# Patient Record
Sex: Male | Born: 1955 | Race: White | Hispanic: No | Marital: Married | Smoking: Former smoker
Health system: Southern US, Community
[De-identification: ages and names within clinical notes are randomized; demographics above are authoritative.]

## PROBLEM LIST (undated history)

## (undated) DIAGNOSIS — I671 Cerebral aneurysm, nonruptured: Secondary | ICD-10-CM

## (undated) DIAGNOSIS — I10 Essential (primary) hypertension: Secondary | ICD-10-CM

## (undated) DIAGNOSIS — I770 Arteriovenous fistula, acquired: Secondary | ICD-10-CM

## (undated) DIAGNOSIS — M329 Systemic lupus erythematosus, unspecified: Secondary | ICD-10-CM

## (undated) DIAGNOSIS — Z79899 Other long term (current) drug therapy: Secondary | ICD-10-CM

## (undated) DIAGNOSIS — I447 Left bundle-branch block, unspecified: Secondary | ICD-10-CM

## (undated) DIAGNOSIS — U071 COVID-19: Secondary | ICD-10-CM

## (undated) DIAGNOSIS — J449 Chronic obstructive pulmonary disease, unspecified: Secondary | ICD-10-CM

## (undated) DIAGNOSIS — N529 Male erectile dysfunction, unspecified: Secondary | ICD-10-CM

## (undated) DIAGNOSIS — E785 Hyperlipidemia, unspecified: Secondary | ICD-10-CM

## (undated) DIAGNOSIS — M1990 Unspecified osteoarthritis, unspecified site: Secondary | ICD-10-CM

## (undated) DIAGNOSIS — I2510 Atherosclerotic heart disease of native coronary artery without angina pectoris: Secondary | ICD-10-CM

## (undated) DIAGNOSIS — F419 Anxiety disorder, unspecified: Secondary | ICD-10-CM

## (undated) DIAGNOSIS — Z7982 Long term (current) use of aspirin: Secondary | ICD-10-CM

## (undated) DIAGNOSIS — K219 Gastro-esophageal reflux disease without esophagitis: Secondary | ICD-10-CM

## (undated) DIAGNOSIS — R7303 Prediabetes: Secondary | ICD-10-CM

## (undated) DIAGNOSIS — Z7960 Long term (current) use of unspecified immunomodulators and immunosuppressants: Secondary | ICD-10-CM

## (undated) DIAGNOSIS — C801 Malignant (primary) neoplasm, unspecified: Secondary | ICD-10-CM

## (undated) DIAGNOSIS — I428 Other cardiomyopathies: Secondary | ICD-10-CM

## (undated) DIAGNOSIS — I779 Disorder of arteries and arterioles, unspecified: Secondary | ICD-10-CM

## (undated) DIAGNOSIS — D649 Anemia, unspecified: Secondary | ICD-10-CM

## (undated) DIAGNOSIS — L4050 Arthropathic psoriasis, unspecified: Secondary | ICD-10-CM

## (undated) DIAGNOSIS — L930 Discoid lupus erythematosus: Secondary | ICD-10-CM

## (undated) DIAGNOSIS — R519 Headache, unspecified: Secondary | ICD-10-CM

## (undated) DIAGNOSIS — J189 Pneumonia, unspecified organism: Secondary | ICD-10-CM

## (undated) DIAGNOSIS — L409 Psoriasis, unspecified: Secondary | ICD-10-CM

## (undated) HISTORY — DX: Malignant (primary) neoplasm, unspecified: C80.1

## (undated) HISTORY — DX: Cerebral aneurysm, nonruptured: I67.1

## (undated) HISTORY — PX: CARDIAC CATHETERIZATION: SHX172

---

## 2005-01-16 ENCOUNTER — Ambulatory Visit: Payer: Self-pay | Admitting: Internal Medicine

## 2005-01-18 ENCOUNTER — Other Ambulatory Visit: Payer: Self-pay

## 2005-01-18 ENCOUNTER — Emergency Department: Payer: Self-pay | Admitting: Emergency Medicine

## 2007-09-25 ENCOUNTER — Emergency Department: Payer: Self-pay | Admitting: Emergency Medicine

## 2008-02-03 ENCOUNTER — Ambulatory Visit: Payer: Self-pay | Admitting: Internal Medicine

## 2008-02-03 DIAGNOSIS — I2510 Atherosclerotic heart disease of native coronary artery without angina pectoris: Secondary | ICD-10-CM

## 2008-02-03 HISTORY — DX: Atherosclerotic heart disease of native coronary artery without angina pectoris: I25.10

## 2008-02-06 ENCOUNTER — Inpatient Hospital Stay: Payer: Self-pay | Admitting: Cardiology

## 2008-02-06 ENCOUNTER — Ambulatory Visit: Payer: Self-pay | Admitting: Cardiology

## 2008-07-23 ENCOUNTER — Ambulatory Visit: Payer: Self-pay | Admitting: Cardiology

## 2010-11-30 ENCOUNTER — Ambulatory Visit: Payer: Self-pay | Admitting: Cardiology

## 2011-06-18 ENCOUNTER — Ambulatory Visit: Payer: Self-pay | Admitting: Family Medicine

## 2011-09-24 ENCOUNTER — Ambulatory Visit: Payer: Self-pay | Admitting: Family Medicine

## 2013-03-30 DIAGNOSIS — L4050 Arthropathic psoriasis, unspecified: Secondary | ICD-10-CM | POA: Insufficient documentation

## 2013-08-28 ENCOUNTER — Other Ambulatory Visit: Payer: Self-pay | Admitting: Rheumatology

## 2013-08-28 LAB — SYNOVIAL CELL COUNT + DIFF, W/ CRYSTALS
BASOS ABS: 0 %
Crystals, Joint Fluid: NONE SEEN
Eosinophil: 0 %
Lymphocytes: 32 %
Neutrophils: 49 %
Nucleated Cell Count: 323 /mm3
Other Cells BF: 0 %
Other Mononuclear Cells: 19 %

## 2015-02-10 ENCOUNTER — Other Ambulatory Visit: Payer: Self-pay | Admitting: Family Medicine

## 2015-02-10 ENCOUNTER — Ambulatory Visit
Admission: RE | Admit: 2015-02-10 | Discharge: 2015-02-10 | Disposition: A | Discharge status: Home / Self Care | Payer: BLUE CROSS/BLUE SHIELD | Source: Ambulatory Visit | Attending: Family Medicine | Admitting: Family Medicine

## 2015-02-10 DIAGNOSIS — M7989 Other specified soft tissue disorders: Secondary | ICD-10-CM | POA: Insufficient documentation

## 2015-02-10 DIAGNOSIS — M79604 Pain in right leg: Secondary | ICD-10-CM

## 2015-03-02 ENCOUNTER — Other Ambulatory Visit
Admission: RE | Admit: 2015-03-02 | Discharge: 2015-03-02 | Disposition: A | Discharge status: Home / Self Care | Payer: BLUE CROSS/BLUE SHIELD | Source: Ambulatory Visit | Attending: Rheumatology | Admitting: Rheumatology

## 2015-03-02 DIAGNOSIS — M25561 Pain in right knee: Secondary | ICD-10-CM | POA: Insufficient documentation

## 2015-03-02 LAB — SYNOVIAL CELL COUNT + DIFF, W/ CRYSTALS
Crystals, Fluid: NONE SEEN
Eosinophils-Synovial: 0 %
Lymphocytes-Synovial Fld: 16 %
Monocyte-Macrophage-Synovial Fluid: 2 %
NEUTROPHIL, SYNOVIAL: 82 %
Other Cells-SYN: 0
WBC, SYNOVIAL: 2873 /mm3 — AB (ref 0–200)

## 2015-10-05 ENCOUNTER — Encounter: Payer: Self-pay | Admitting: Dietician

## 2015-10-05 ENCOUNTER — Encounter: Payer: BLUE CROSS/BLUE SHIELD | Attending: Family Medicine | Admitting: Dietician

## 2015-10-05 VITALS — Ht 72.0 in | Wt 192.2 lb

## 2015-10-05 DIAGNOSIS — E785 Hyperlipidemia, unspecified: Secondary | ICD-10-CM

## 2015-10-05 DIAGNOSIS — I10 Essential (primary) hypertension: Secondary | ICD-10-CM

## 2015-10-05 DIAGNOSIS — L4050 Arthropathic psoriasis, unspecified: Secondary | ICD-10-CM

## 2015-10-05 DIAGNOSIS — R739 Hyperglycemia, unspecified: Secondary | ICD-10-CM

## 2015-10-05 NOTE — Patient Instructions (Signed)
   Plan for each meal to include a vegetable or fruit, or both. You can also add a vegetable or fruit for a snack during the day.   Good job including whole grain foods and sources of fiber!  Control portions of meats (palm-size), starches like rice, pasta, potatoes (1 cup, fist-size or less).  Try new meal ideas using recipes provided.  Add some light exercise, starting with 5-10 minutes, and increasing time gradually if you are able to do so without significant pain.

## 2015-10-05 NOTE — Progress Notes (Signed)
Medical Nutrition Therapy: Visit start time: 1030  end time: 1130  Assessment:  Diagnosis: hypertension, HLD, pre-diabetes Past medical history: GERD, rheumatoid arthritis Psychosocial issues/ stress concerns: history of anxiety Preferred learning method:  . Visual  Current weight: 192.2lbs with shoes  Height: 6'0" Medications, supplements: reviewed list in chart with patient  Progress and evaluation: Patient reports some history of elevated blood pressure, which he feels is now under control. He has started taking cholesterol-lowering medication. He voices concern over maintaining his overall health and reducing arthritis pain; he would like to manage with lifestyle as much as possible and minimize need for pain meds. He has worked to decrease sugar intake to prevent diabetes.    Physical activity: no regular exercise due to experiencing pain when exercising for more than 15 minutes. He does like to be active.   Dietary Intake:  Usual eating pattern includes 3 meals and 2 snacks per day. Dining out frequency: 2-3 meals per week.  Breakfast: 7am (not inst) oatmeal with psyllium husk, butter, honey Snack: none Lunch: 10/3 frozen pizza; 10/2 pb honey sandwiches (2) some with chips Snack: peanuts or mixed nuts, occ chips  Supper: taco salad with cheese past 2 days Snack: had been eating ice cream bars, but decreasing frequency. Occasionally craves sugar Beverages: water, tries to avoid sodas; 2-3 cups coffee in am, black  Nutrition Care Education: Topics covered: HTN, HLD, anti-inflammatory nutrition Basic nutrition: basic food groups, appropriate nutrient balance Diabetes prevention: importance of controlling sugar and carbohydrate intake, increasing fiber and vegetables and whole grains, role of protein.  Hypertension: identifying high sodium foods, identifying food sources of Calcium, potassium, magnesium Hyperlipidemia: healthy and unhealthy fats, role of fiber, food sources of  phytochemicals. Instructed on guidelines for DASH diet  Anti-inflammatory:  Importance of vegetables and fruits, whole grains, fish, limiting red and processed meats and added sugars. Discussed some potential for specific foods to have benefit such as curcumin, ginger, herbs, benefit of regular exercise.  Nutritional Diagnosis:  [place]-2.2 Altered nutrition-related laboratory As related to elevated cholesterol and blood sugar.  As evidenced by recent lab results, MD report.  Intervention: Instruction as noted above.   Encouraged DASH diet to improve HTN, HLD, as well as prevent DM and control arthritis pain.   Set goals with patient input.   Education Materials given:  Marland Kitchen. DASH diet . Fighting Inflammation . Anti-inflammatory recipes [facility] [facility] . Goals/ instructions  Learner/ who was taught:  . Patient   Level of understanding: Marland Kitchen. Verbalizes/ demonstrates competency  Demonstrated degree of understanding via:   Teach back Learning barriers: . None  Willingness to learn/ readiness for change: . Eager, change in progress  Monitoring and Evaluation:  Dietary intake, exercise, pain level and cholesterol, BP, blood sugar control      follow up: 11/16/15

## 2015-11-16 ENCOUNTER — Ambulatory Visit: Payer: BLUE CROSS/BLUE SHIELD | Admitting: Dietician

## 2015-12-05 ENCOUNTER — Encounter: Payer: Self-pay | Admitting: Dietician

## 2015-12-05 NOTE — Progress Notes (Signed)
Have not heard from patient to reschedule appointment which he cancelled for 11/15/16. Sent discharge letter to MD.

## 2016-03-28 DIAGNOSIS — F411 Generalized anxiety disorder: Secondary | ICD-10-CM | POA: Insufficient documentation

## 2016-11-14 DIAGNOSIS — Z87898 Personal history of other specified conditions: Secondary | ICD-10-CM | POA: Insufficient documentation

## 2016-12-19 ENCOUNTER — Other Ambulatory Visit: Payer: Self-pay | Admitting: Rheumatology

## 2016-12-19 DIAGNOSIS — M2392 Unspecified internal derangement of left knee: Secondary | ICD-10-CM

## 2016-12-19 DIAGNOSIS — M25562 Pain in left knee: Secondary | ICD-10-CM

## 2016-12-24 ENCOUNTER — Ambulatory Visit
Admission: RE | Admit: 2016-12-24 | Discharge: 2016-12-24 | Disposition: A | Discharge status: Home / Self Care | Payer: BC Managed Care – PPO | Source: Ambulatory Visit | Attending: Rheumatology | Admitting: Rheumatology

## 2016-12-24 DIAGNOSIS — M2392 Unspecified internal derangement of left knee: Secondary | ICD-10-CM | POA: Insufficient documentation

## 2016-12-24 DIAGNOSIS — M25562 Pain in left knee: Secondary | ICD-10-CM | POA: Diagnosis present

## 2016-12-24 LAB — POCT I-STAT CREATININE: Creatinine, Ser: 0.9 mg/dL (ref 0.61–1.24)

## 2016-12-24 MED ORDER — GADOBENATE DIMEGLUMINE 529 MG/ML IV SOLN
20.0000 mL | Freq: Once | INTRAVENOUS | Status: AC | PRN
  Administered 2016-12-24: 17 mL via INTRAVENOUS

## 2017-04-04 DIAGNOSIS — N529 Male erectile dysfunction, unspecified: Secondary | ICD-10-CM | POA: Insufficient documentation

## 2017-10-09 DIAGNOSIS — E559 Vitamin D deficiency, unspecified: Secondary | ICD-10-CM | POA: Insufficient documentation

## 2018-08-13 DIAGNOSIS — I10 Essential (primary) hypertension: Secondary | ICD-10-CM | POA: Insufficient documentation

## 2019-02-17 DIAGNOSIS — R7303 Prediabetes: Secondary | ICD-10-CM | POA: Insufficient documentation

## 2019-03-25 DIAGNOSIS — M85852 Other specified disorders of bone density and structure, left thigh: Secondary | ICD-10-CM | POA: Insufficient documentation

## 2019-06-10 ENCOUNTER — Ambulatory Visit (INDEPENDENT_AMBULATORY_CARE_PROVIDER_SITE_OTHER): Payer: Medicare PPO | Admitting: Dermatology

## 2019-06-10 ENCOUNTER — Other Ambulatory Visit: Payer: Self-pay

## 2019-06-10 DIAGNOSIS — Z1283 Encounter for screening for malignant neoplasm of skin: Secondary | ICD-10-CM | POA: Diagnosis not present

## 2019-06-10 DIAGNOSIS — L930 Discoid lupus erythematosus: Secondary | ICD-10-CM

## 2019-06-10 DIAGNOSIS — L409 Psoriasis, unspecified: Secondary | ICD-10-CM

## 2019-06-10 DIAGNOSIS — L4050 Arthropathic psoriasis, unspecified: Secondary | ICD-10-CM

## 2019-06-10 DIAGNOSIS — Q809 Congenital ichthyosis, unspecified: Secondary | ICD-10-CM

## 2019-06-10 MED ORDER — TRIAMCINOLONE ACETONIDE 0.1 % EX CREA: 1.0000 "application " | TOPICAL_CREAM | Freq: Every day | CUTANEOUS | 1 refills | Status: DC | PRN

## 2019-06-10 MED ORDER — ENSTILAR 0.005-0.064 % EX FOAM: CUTANEOUS | 4 refills | Status: DC

## 2019-06-10 NOTE — Progress Notes (Signed)
   New Patient Visit  Subjective  Brandin Stetzer Colman Birdwell. is a 64 y.o. male who presents for the following: Psoriasis (patient also has psoriatic arthritis and is using Enbrel. He was off of Enbrel for a few months due to insurance issues but restarted it about a week ago. Patient c/o itching and is concerned that some psoriasis areas may be infected. He is currently using Dial soap, H2O2, and Neosporin to aa's). The patient presents for Total-Body Skin Exam (TBSE) for skin cancer screening and mole check.  The following portions of the chart were reviewed this encounter and updated as appropriate:  Allergies  Meds  Problems  Med Hx  Surg Hx  Fam Hx     Review of Systems:  No other skin or systemic complaints except as noted in HPI or Assessment and Plan.  Objective  Well appearing patient in no apparent distress; mood and affect are within normal limits.  A total body skin examination was performed including trunk and extremities. Relevant physical exam findings are noted in the Assessment and Plan.  Objective  trunk and extremities: Well-demarcated erythematous papules/plaques with silvery scale.   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 Advised patient that Enbrel doesn't "kill" the immune system, it suppresses it, but that doesn't mean he's at an increased risk of contracting COVID.   Start Enstilar foam BID x 2 weeks then decrease to 5d/wk PRN flares. If not covered by insurance will start TMC 0.1% cream BID. Avoid f/g/a. Will send in both medications and patient to decide which one is more affordable for  him.  Continue Enbrel as prescribed by Dr. Gavin Potters.  Calcipotriene-Betameth Diprop (ENSTILAR) 0.005-0.064 % FOAM - trunk and extremities  triamcinolone cream (KENALOG) 0.1 % - trunk and extremities  Ichthyosis trunk and extremities  Recommend Amlactin Rapid Relief cream daily. Avoid taking hot showers, instead use cool to warm water.  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  Return in about 4 months (around 10/10/2019) for psoriasis recheck.  Maylene Roes, CMA, am acting as scribe for Armida Sans, MD .  Documentation: I have reviewed the above documentation for accuracy and completeness, and I agree with the above.  Armida Sans, MD

## 2019-06-11 ENCOUNTER — Encounter: Payer: Self-pay | Admitting: Dermatology

## 2019-06-19 LAB — COLOGUARD: COLOGUARD: NEGATIVE

## 2019-06-19 LAB — EXTERNAL GENERIC LAB PROCEDURE: COLOGUARD: NEGATIVE

## 2019-07-20 ENCOUNTER — Other Ambulatory Visit: Payer: Self-pay | Admitting: Dermatology

## 2019-07-20 DIAGNOSIS — L409 Psoriasis, unspecified: Secondary | ICD-10-CM

## 2019-08-21 DIAGNOSIS — Z87891 Personal history of nicotine dependence: Secondary | ICD-10-CM | POA: Insufficient documentation

## 2019-08-26 ENCOUNTER — Telehealth: Payer: Self-pay

## 2019-08-26 NOTE — Telephone Encounter (Signed)
Contacted patient for lung CT screening program after receiving referral from Dr. Burnadette Pop.  I explained program and patient is agreeable.  His smoking status is former smoker.  He smoked from age 64 to year 2010 and smoke 1 pack a day (he says sometimes he would smoke a few from a 2nd pack but not often).  Patient confirmed his insurance is [employer].  Patient shared he had a few CT scans in 2010 for a left lung nodule and he says no cancer was found at that time.  He has no current symptoms of cancer. He is hard of hearing and uses a hearing aid.  CT scan scheduled for Tuesday, Sept 21 at 2:00 pm.  Patient knows to arrive a little early to have shared decision making appointment with Nurse Practitioner.

## 2019-08-27 ENCOUNTER — Ambulatory Visit: Payer: BC Managed Care – PPO | Admitting: Dermatology

## 2019-08-28 ENCOUNTER — Other Ambulatory Visit: Payer: Self-pay | Admitting: *Deleted

## 2019-08-28 DIAGNOSIS — Z122 Encounter for screening for malignant neoplasm of respiratory organs: Secondary | ICD-10-CM

## 2019-08-28 DIAGNOSIS — Z87891 Personal history of nicotine dependence: Secondary | ICD-10-CM

## 2019-08-28 NOTE — Progress Notes (Signed)
Former, smoker, quit 2010, 37 pack year

## 2019-09-22 ENCOUNTER — Inpatient Hospital Stay: Payer: Medicare PPO | Attending: Nurse Practitioner | Admitting: Oncology

## 2019-09-22 ENCOUNTER — Ambulatory Visit
Admission: RE | Admit: 2019-09-22 | Discharge: 2019-09-22 | Disposition: A | Discharge status: Home / Self Care | Payer: Medicare PPO | Source: Ambulatory Visit | Attending: Nurse Practitioner | Admitting: Nurse Practitioner

## 2019-09-22 ENCOUNTER — Other Ambulatory Visit: Payer: Self-pay

## 2019-09-22 ENCOUNTER — Encounter: Payer: Self-pay | Admitting: Oncology

## 2019-09-22 DIAGNOSIS — Z87891 Personal history of nicotine dependence: Secondary | ICD-10-CM

## 2019-09-22 DIAGNOSIS — Z122 Encounter for screening for malignant neoplasm of respiratory organs: Secondary | ICD-10-CM | POA: Diagnosis present

## 2019-09-22 NOTE — Progress Notes (Signed)
Virtual Visit via Video Note  I connected with Timothy Reeves on 09/22/19 at  2:00 PM EDT by a video enabled telemedicine application and verified that I am speaking with the correct person using two identifiers.  Location: Patient: [name] Provider:Clinic    I discussed the limitations of evaluation and management by telemedicine and the availability of in person appointments. The patient expressed understanding and agreed to proceed.  I discussed the assessment and treatment plan with the patient. The patient was provided an opportunity to ask questions and all were answered. The patient agreed with the plan and demonstrated an understanding of the instructions.   The patient was advised to call back or seek an in-person evaluation if the symptoms worsen or if the condition fails to improve as anticipated.   In accordance with CMS guidelines, patient has met eligibility criteria including age, absence of signs or symptoms of lung cancer.  Social History   Tobacco Use  . Smoking status: Former Smoker    Packs/day: 1.00    Years: 37.00    Pack years: 37.00    Types: Cigarettes    Quit date: 10/04/2008    Years since quitting: 10.9  Substance Use Topics  . Alcohol use: Yes    Alcohol/week: 2.0 standard drinks    Types: 2 Cans of beer per week  . Drug use: Not on file      A shared decision-making session was conducted prior to the performance of CT scan. This includes one or more decision aids, includes benefits and harms of screening, follow-up diagnostic testing, over-diagnosis, false positive rate, and total radiation exposure.   Counseling on the importance of adherence to annual lung cancer LDCT screening, impact of co-morbidities, and ability or willingness to undergo diagnosis and treatment is imperative for compliance of the program.   Counseling on the importance of continued smoking cessation for former smokers; the importance of smoking cessation for current smokers, and  information about tobacco cessation interventions have been given to patient including [place] and 1800 quit [place] programs.   Written order for lung cancer screening with LDCT has been given to the patient and any and all questions have been answered to the best of my abilities.    Yearly follow up will be coordinated by Burgess Estelle, Thoracic Navigator.  I provided 15 minutes of face-to-face video visit time during this encounter, and > 50% was spent counseling as documented under my assessment & plan.   Jacquelin Hawking, NP

## 2019-09-24 ENCOUNTER — Telehealth: Payer: Self-pay | Admitting: *Deleted

## 2019-09-24 NOTE — Telephone Encounter (Signed)
Notified patient of LDCT lung cancer screening program results with recommendation for 6 month follow up imaging. Also notified of incidental findings noted below and is encouraged to discuss further with PCP who will receive a copy of this note and/or the CT report. Patient verbalizes understanding.   IMPRESSION: 1. Lung-RADS 3S, probably benign findings. Short-term follow-up in 6 months is recommended with repeat low-dose chest CT without contrast (please use the following order, "CT CHEST LCS NODULE FOLLOW-UP W/O CM"). 2. The "S" modifier above refers to potentially clinically significant non lung cancer related findings. Specifically, there is aortic atherosclerosis, in addition to left main and 2 vessel coronary artery disease. Please note that although the presence of coronary artery calcium documents the presence of coronary artery disease, the severity of this disease and any potential stenosis cannot be assessed on this non-gated CT examination. Assessment for potential risk factor modification, dietary therapy or pharmacologic therapy may be warranted, if clinically indicated. 3. Mild diffuse bronchial wall thickening with moderate centrilobular and paraseptal emphysema; imaging findings suggestive of underlying COPD.  Aortic Atherosclerosis (ICD10-I70.0) and Emphysema (ICD10-J43.9).

## 2019-10-12 ENCOUNTER — Ambulatory Visit: Payer: Medicare PPO | Admitting: Dermatology

## 2019-10-12 ENCOUNTER — Other Ambulatory Visit: Payer: Self-pay

## 2019-10-12 ENCOUNTER — Encounter: Payer: Self-pay | Admitting: Dermatology

## 2019-10-12 DIAGNOSIS — L4050 Arthropathic psoriasis, unspecified: Secondary | ICD-10-CM | POA: Diagnosis not present

## 2019-10-12 DIAGNOSIS — L400 Psoriasis vulgaris: Secondary | ICD-10-CM | POA: Diagnosis not present

## 2019-10-12 DIAGNOSIS — L930 Discoid lupus erythematosus: Secondary | ICD-10-CM | POA: Diagnosis not present

## 2019-10-12 MED ORDER — CALCIPOTRIENE 0.005 % EX CREA: TOPICAL_CREAM | CUTANEOUS | 3 refills | Status: DC

## 2019-10-12 NOTE — Progress Notes (Signed)
   Follow-Up Visit   Subjective  Timothy Reeves. is a 64 y.o. male who presents for the following: Psoriasis (with PsA - patient currently using TMC 0.1% cream PRN flares. He is hesitant to restart the Enbrel due to COVID and he doesn't want to decrease/suppress his immune system at this time). He does not want to get the COVID vaccine at this point in time.   The following portions of the chart were reviewed this encounter and updated as appropriate:  Allergies  Meds  Problems  Med Hx  Surg Hx  Fam Hx     Review of Systems:  No other skin or systemic complaints except as noted in HPI or Assessment and Plan.  Objective  Well appearing patient in no apparent distress; mood and affect are within normal limits.  A focused examination was performed including the extremities . Relevant physical exam findings are noted in the Assessment and Plan.  Objective  lower legs: Well-demarcated erythematous papules/plaques with silvery scale.   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  Currently off Enbrel.  If he does re-start a biologic, he may do better with joints and skin both with Cosentyx or Taltz.  The pt and Dr Lavenia Atlas will discuss since the arthritis is the primary issue.  Discussed with patient that not using Enbrel will not prevent him from getting COVID. Advised patient to discuss and follow up on Enbrel and COVID with Dr. Gavin Potters.   Continue TMC 0.1% cream QOD alternating with Calcipotriene PRN flares. Avoid f/g/a. Topical steroids (such as triamcinolone, fluocinolone, fluocinonide, mometasone, clobetasol, halobetasol, betamethasone, hydrocortisone) can cause thinning and lightening of the skin if they are used for too long in the same area. Your physician has selected the right strength medicine for your problem and area affected on  the body. Please use your medication only as directed by your physician to prevent side effects.    Start Calcipotriene cream QOD alternating with TMC.   Recommend wearing shorter socks that don't rub the lower legs as much.   Recommend multivitamin A, C, and E daily.   Recommend Taltz or Cosentyx instead of Enbrel when patient is ready to restart biologic treatment.   calcipotriene (DOVONOX) 0.005 % cream - lower legs  Discoid lupus erythematosus Face With systemic involvement per patient, currently in remission - Discussed laser treatment for scarring patient to follow up with Timothy Reeves, our laser technician at [facility] skin center.  Return in about 3 months (around 01/12/2020) for F/U appt. - psoriasis with PsA .  Timothy Reeves, CMA, am acting as scribe for Armida Sans, MD .  Documentation: I have reviewed the above documentation for accuracy and completeness, and I agree with the above.  Armida Sans, MD

## 2019-10-12 NOTE — Patient Instructions (Signed)
Topical steroids (such as triamcinolone, fluocinolone, fluocinonide, mometasone, clobetasol, halobetasol, betamethasone, hydrocortisone) can cause thinning and lightening of the skin if they are used for too long in the same area. Your physician has selected the right strength medicine for your problem and area affected on the body. Please use your medication only as directed by your physician to prevent side effects.   Recommend a multivitamin containing A, C, and E.

## 2019-10-21 ENCOUNTER — Telehealth: Payer: Self-pay

## 2019-10-21 MED ORDER — BETAMETHASONE DIPROPIONATE 0.05 % EX CREA
TOPICAL_CREAM | Freq: Two times a day (BID) | CUTANEOUS | 0 refills | Status: DC | PRN
Start: 2019-10-21 — End: 2021-05-03

## 2019-10-21 NOTE — Telephone Encounter (Signed)
Calcipotriene Cream has been denied by [employer]. They state patient must first try and fail Betamethasone Dipropionate. Even with GoodRX coupon the medication is still over $100. *Pt has medicare ins.

## 2019-10-21 NOTE — Telephone Encounter (Signed)
Send betamethasone diproprionate Keep fu appt

## 2019-10-21 NOTE — Telephone Encounter (Signed)
Prescription sent in and patient advised. 

## 2019-11-10 ENCOUNTER — Telehealth: Payer: Self-pay

## 2019-11-10 NOTE — Telephone Encounter (Signed)
Patient has been using Betamethasone with no improvement. Patient states he feels as if the topical has made his psoriasis worse.   Will file an appeal.

## 2019-11-12 ENCOUNTER — Other Ambulatory Visit: Payer: Self-pay

## 2019-11-12 DIAGNOSIS — L400 Psoriasis vulgaris: Secondary | ICD-10-CM

## 2019-11-12 MED ORDER — CALCIPOTRIENE 0.005 % EX CREA: TOPICAL_CREAM | CUTANEOUS | 3 refills | Status: DC

## 2019-11-12 NOTE — Progress Notes (Signed)
RX reordered due to [facility] approval

## 2020-01-28 ENCOUNTER — Ambulatory Visit: Payer: Medicare PPO | Admitting: Dermatology

## 2020-01-28 ENCOUNTER — Other Ambulatory Visit: Payer: Self-pay

## 2020-01-28 DIAGNOSIS — L409 Psoriasis, unspecified: Secondary | ICD-10-CM

## 2020-01-28 DIAGNOSIS — L905 Scar conditions and fibrosis of skin: Secondary | ICD-10-CM

## 2020-01-28 DIAGNOSIS — L4050 Arthropathic psoriasis, unspecified: Secondary | ICD-10-CM | POA: Diagnosis not present

## 2020-01-28 DIAGNOSIS — L400 Psoriasis vulgaris: Secondary | ICD-10-CM | POA: Diagnosis not present

## 2020-01-28 DIAGNOSIS — L930 Discoid lupus erythematosus: Secondary | ICD-10-CM | POA: Diagnosis not present

## 2020-01-28 NOTE — Patient Instructions (Addendum)
Start Otezla sample pack.  Take 10 mg once a day for 4 days. Then 20 mg once a day for 4 days. Then 30 mg once a day until your follow up.  Side effects of Otezla (apremilast) include diarrhea, nausea, headache, upper respiratory infection, depression, and weight decrease (5-10%). It should only be taken by pregnant women after a discussion regarding risks and benefits with their doctor. Goal is control of skin condition, not cure.  The use of [place] requires long term medication management, including periodic office visits.

## 2020-01-28 NOTE — Progress Notes (Signed)
   Follow-Up Visit   Subjective  Timothy Reeves. is a 65 y.o. male who presents for the following: Psoriasis (3 mo f/u. Pt has been alternating TMC 0.1% cream with Calcipotriene PRN flares. He states that the [facility] has really been helping. Pt has used Enbrel in the past but stopped it due to COVID. Pt does not want to suppress his immune system. ).  The following portions of the chart were reviewed this encounter and updated as appropriate:  Allergies  Meds  Problems  Med Hx  Surg Hx  Fam Hx     Review of Systems: No other skin or systemic complaints except as noted in HPI or Assessment and Plan.  Objective  Well appearing patient in no apparent distress; mood and affect are within normal limits.  A focused examination was performed including legs, abdomen, back, arms. Relevant physical exam findings are noted in the Assessment and Plan.  Objective  lower legs, abdomen, back,: Well-demarcated erythematous papules/plaques with silvery scale, guttate pink scaly papules.   BSA 25%  Images        Objective  face: Scarring from discoid lesions caused by lupus  Images        Assessment & Plan  Psoriasis of skin - severe and generalized - with Psoriatic Arthritis lower legs, abdomen, back, Plaque psoriasis with Psoriatic Arthritis Joints are not bothering him.  Pt has had good results with Enbrel in past but due to COVID, wants to stay off Enbrel due to immunosuppression. Discussed [place] option as is not a significant immunosuppressant.  Discussed starting Otezla.  Sample pack given today.   Titrate up to 30 mg po. Take 10 mg once a day for 4 days. Then 20 mg once a day for 4 days. Then 30 mg once a day until f/u.   Side effects of Otezla (apremilast) include diarrhea, nausea, headache, upper respiratory infection, depression, and weight decrease (5-10%). It should only be taken by pregnant women after a discussion regarding risks and benefits with their  doctor. Goal is control of skin condition, not cure.  The use of [place] requires long term medication management, including periodic office visits.  Psoriasis is a chronic non-curable, but treatable genetic/hereditary disease that may have other systemic features affecting other organ systems such as joints (Psoriatic Arthritis). It is associated with an increased risk of inflammatory bowel disease, heart disease, non-alcoholic fatty liver disease, and depression.    Other Related Medications Calcipotriene-Betameth Diprop (ENSTILAR) 0.005-0.064 % FOAM triamcinolone cream (KENALOG) 0.1 %  Scars of face from Discoid Lupus as component of Systemic Lupus in past - now in remission. Face See photos.  Pt had asked if any treatment options. Discussed that there is not a good treatment option for the scars.  Discussed laser option, but do not feel worthwhile.  Return in about 1 month (around 02/28/2020) for psoriasis.   IEpifania Gore, CMA, am acting as scribe for Armida Sans, MD.  Documentation: I have reviewed the above documentation for accuracy and completeness, and I agree with the above.  Armida Sans, MD

## 2020-02-09 ENCOUNTER — Encounter: Payer: Self-pay | Admitting: Dermatology

## 2020-02-29 DIAGNOSIS — Z862 Personal history of diseases of the blood and blood-forming organs and certain disorders involving the immune mechanism: Secondary | ICD-10-CM | POA: Insufficient documentation

## 2020-03-01 ENCOUNTER — Other Ambulatory Visit (HOSPITAL_COMMUNITY): Payer: Self-pay | Admitting: Family Medicine

## 2020-03-01 ENCOUNTER — Other Ambulatory Visit: Payer: Self-pay | Admitting: Family Medicine

## 2020-03-01 DIAGNOSIS — Z87891 Personal history of nicotine dependence: Secondary | ICD-10-CM

## 2020-03-01 DIAGNOSIS — Z136 Encounter for screening for cardiovascular disorders: Secondary | ICD-10-CM

## 2020-03-03 ENCOUNTER — Ambulatory Visit: Payer: Medicare PPO | Admitting: Dermatology

## 2020-03-07 ENCOUNTER — Ambulatory Visit
Admission: RE | Admit: 2020-03-07 | Discharge: 2020-03-07 | Disposition: A | Discharge status: Home / Self Care | Payer: Medicare PPO | Source: Ambulatory Visit | Attending: Family Medicine | Admitting: Family Medicine

## 2020-03-07 ENCOUNTER — Telehealth: Payer: Self-pay | Admitting: *Deleted

## 2020-03-07 ENCOUNTER — Other Ambulatory Visit: Payer: Self-pay

## 2020-03-07 DIAGNOSIS — Z87891 Personal history of nicotine dependence: Secondary | ICD-10-CM | POA: Diagnosis not present

## 2020-03-07 DIAGNOSIS — Z136 Encounter for screening for cardiovascular disorders: Secondary | ICD-10-CM | POA: Insufficient documentation

## 2020-03-07 NOTE — Telephone Encounter (Signed)
Called pt and wa able to get him on the phone and he is interested to get 6 month low dose lung screening. He stopped smoking 2010. Smoked 1PPD  since he was 26 until til he stopped in 2010. No cancer or working up for cancer, no copd and pt still has [employer]. Pt was set up for 3/31 at 8 am at outpatient [place] location and pt thought he remembered where it was but he took down address and telephone number just in case.

## 2020-03-10 ENCOUNTER — Other Ambulatory Visit: Payer: Self-pay | Admitting: *Deleted

## 2020-03-10 DIAGNOSIS — R918 Other nonspecific abnormal finding of lung field: Secondary | ICD-10-CM

## 2020-03-10 DIAGNOSIS — Z87891 Personal history of nicotine dependence: Secondary | ICD-10-CM

## 2020-03-10 NOTE — Progress Notes (Signed)
Contacted and scheduled for LCS nodule follow up scan. Patient is a former smoker, quit 2010, 37 pack year history.  

## 2020-03-29 ENCOUNTER — Emergency Department
Admission: EM | Admit: 2020-03-29 | Discharge: 2020-03-29 | Disposition: A | Discharge status: Home / Self Care | Payer: Medicare PPO | Attending: Emergency Medicine | Admitting: Emergency Medicine

## 2020-03-29 ENCOUNTER — Other Ambulatory Visit: Payer: Self-pay

## 2020-03-29 DIAGNOSIS — Z87891 Personal history of nicotine dependence: Secondary | ICD-10-CM | POA: Diagnosis not present

## 2020-03-29 DIAGNOSIS — I10 Essential (primary) hypertension: Secondary | ICD-10-CM

## 2020-03-29 DIAGNOSIS — R42 Dizziness and giddiness: Secondary | ICD-10-CM | POA: Insufficient documentation

## 2020-03-29 HISTORY — DX: Essential (primary) hypertension: I10

## 2020-03-29 LAB — COMPREHENSIVE METABOLIC PANEL
ALT: 19 U/L (ref 0–44)
AST: 23 U/L (ref 15–41)
Albumin: 4.1 g/dL (ref 3.5–5.0)
Alkaline Phosphatase: 77 U/L (ref 38–126)
Anion gap: 7 (ref 5–15)
BUN: 21 mg/dL (ref 8–23)
CO2: 24 mmol/L (ref 22–32)
Calcium: 9.3 mg/dL (ref 8.9–10.3)
Chloride: 107 mmol/L (ref 98–111)
Creatinine, Ser: 0.81 mg/dL (ref 0.61–1.24)
GFR, Estimated: >60 mL/min (ref >60)
Glucose, Bld: 105 mg/dL — ABNORMAL HIGH (ref 70–99)
Potassium: 4.2 mmol/L (ref 3.5–5.1)
Sodium: 138 mmol/L (ref 135–145)
Total Bilirubin: 1.2 mg/dL (ref 0.3–1.2)
Total Protein: 7.4 g/dL (ref 6.5–8.1)

## 2020-03-29 LAB — CBC
HCT: 39.9 % (ref 39.0–52.0)
Hemoglobin: 13.8 g/dL (ref 13.0–17.0)
MCH: 30.4 pg (ref 26.0–34.0)
MCHC: 34.6 g/dL (ref 30.0–36.0)
MCV: 87.9 fL (ref 80.0–100.0)
Platelets: 205 10*3/uL (ref 150–400)
RBC: 4.54 MIL/uL (ref 4.22–5.81)
RDW: 12.7 % (ref 11.5–15.5)
WBC: 5.1 10*3/uL (ref 4.0–10.5)
nRBC: 0 % (ref 0.0–0.2)

## 2020-03-29 LAB — TROPONIN I (HIGH SENSITIVITY): Troponin I (High Sensitivity): 16 ng/L (ref <18)

## 2020-03-29 MED ORDER — LORAZEPAM 1 MG PO TABS: 1.0000 mg | ORAL_TABLET | Freq: Once | ORAL | Status: DC

## 2020-03-29 MED ORDER — SODIUM CHLORIDE 0.9 % IV BOLUS
1000.0000 mL | Freq: Once | INTRAVENOUS | Status: AC
  Administered 2020-03-29: 1000 mL via INTRAVENOUS

## 2020-03-29 NOTE — ED Provider Notes (Signed)
[facility] [place] Emergency Department Provider Note  Time seen: 10:19 AM  I have reviewed the triage vital signs and the nursing notes.   HISTORY  Chief Complaint Dizziness   HPI Timothy Caso. is a 65 y.o. male with a past medical history of hypertension who presents to the emergency department for dizziness.  According to the patient for the past 3 days now he has been feeling dizzy which he describes as more of a lightheaded/spinning sensation.  Patient went to [facility] clinic and was sent to the emergency department for high blood pressure per patient greater than 200.  Patient states over the past 3 days he has been feeling somewhat off balance with a spinning type sensation.  Patient denies any weakness or numbness of any arm or leg confusion or slurred speech.  Denies any fever but does state 2 days ago was having cough and congestion.  No vomiting or diarrhea.  No chest pain or abdominal pain.  Largely negative review of systems otherwise.   Past Medical History:  Diagnosis Date  . Hypertension     There are no problems to display for this patient.   Past Surgical History:  Procedure Laterality Date  . CARDIAC CATHETERIZATION      Prior to Admission medications   Medication Sig Start Date End Date Taking? Authorizing Provider  atorvastatin (LIPITOR) 20 MG tablet TAKE 1/2 TABLET EVERY NIGHT 05/18/15   [provider]  betamethasone dipropionate 0.05 % cream Apply topically 2 (two) times daily as needed (Rash). 10/21/19   Deirdre Evener, MD  calcipotriene (DOVONOX) 0.005 % cream Apply to aa's psoriasis QOD alternating with Triamcinolone. 11/12/19   Deirdre Evener, MD  Calcipotriene-Betameth Diprop (ENSTILAR) 0.005-0.064 % FOAM Apply to aa's psoriasis BID x 2 weeks. Then decrease to BID 5d/wk. Avoid f/g/a. 06/10/19   Deirdre Evener, MD  etanercept (ENBREL SURECLICK) 50 MG/ML injection Inject into the skin. 02/09/19   [provider]   naproxen (NAPROSYN) 500 MG tablet Take by mouth. 09/26/15   [provider]  omeprazole (PRILOSEC) 20 MG capsule TAKE 1 CAPSULE BY MOUTH EVERY OTHER DAY 05/06/15   [provider]  PARoxetine (PAXIL) 20 MG tablet TAKE 1/2 TABLET AT BEDTIME 07/28/15   [provider]  sildenafil (VIAGRA) 100 MG tablet Take by mouth.    [provider]  triamcinolone cream (KENALOG) 0.1 % APPLY TO AFFECTED AREAS (PSORIASIS) TWICE DAILY AS NEEDED FOR FLARES. AVOID FACE, GROIN, AXILLA. 07/20/19   Deirdre Evener, MD    No Known Allergies  No family history on file.  Social History Social History   Tobacco Use  . Smoking status: Former Smoker    Packs/day: 1.00    Years: 37.00    Pack years: 37.00    Types: Cigarettes    Quit date: 10/04/2008    Years since quitting: 11.4  . Smokeless tobacco: Never Used  Substance Use Topics  . Alcohol use: Yes    Alcohol/week: 2.0 standard drinks    Types: 2 Cans of beer per week  . Drug use: Not Currently    Review of Systems Constitutional: Negative for fever. ENT: Congestion 2 days ago now largely resolved Cardiovascular: Negative for chest pain. Respiratory: Negative for shortness of breath.  Mild cough 2 days ago, largely resolved. Gastrointestinal: Negative for abdominal pain, vomiting and diarrhea. Musculoskeletal: Negative for musculoskeletal complaints Neurological: Negative for headache All other ROS negative  ____________________________________________   PHYSICAL EXAM:  VITAL  SIGNS: ED Triage Vitals  Enc Vitals Group     BP 03/29/20 1000 (!) 210/123     Pulse Rate 03/29/20 1000 86     Resp 03/29/20 1000 17     Temp 03/29/20 1000 97.8 F (36.6 C)     Temp Source 03/29/20 1000 Oral     SpO2 03/29/20 1000 97 %     Weight 03/29/20 0954 180 lb (81.6 kg)     Height 03/29/20 0954 6\' 1"  (1.854 m)     Head Circumference --      Peak Flow --      Pain Score 03/29/20 0953 0     Pain Loc --      Pain Edu? --       Excl. in GC? --    Constitutional: Alert and oriented. Well appearing and in no distress. Eyes: Normal exam ENT      Head: Normocephalic and atraumatic.      Mouth/Throat: Mucous membranes are moist. Cardiovascular: Normal rate, regular rhythm. Respiratory: Normal respiratory effort without tachypnea nor retractions. Breath sounds are clear Gastrointestinal: Soft and nontender. No distention. Musculoskeletal: Nontender with normal range of motion in all extremities Neurologic:  Normal speech and language. No gross focal neurologic deficits.  Equal grip strength bilaterally.  No cranial nerve deficits Skin:  Skin is warm, dry and intact.  Psychiatric: Somewhat anxious appearing.  ____________________________________________    EKG  EKG viewed and interpreted by myself shows a normal sinus rhythm at 63 bpm with a widened QRS, right axis deviation, largely normal intervals with nonspecific ST changes.  Morphology most consistent with left bundle branch block.  Last EKG for comparison is 2007. Interviewing the patient's care everywhere on 05/21/2019 he had an EKG performed that is also read as a left bundle branch block with nonspecific T wave abnormalities in the inferior leads.  Which could be consistent with today's EKG. ____________________________________________    INITIAL IMPRESSION / ASSESSMENT AND PLAN / ED COURSE  Pertinent labs & imaging results that were available during my care of the patient were reviewed by me and considered in my medical decision making (see chart for details).   Patient presents to the emergency department for dizziness ongoing over the past 3 days.  Patient sent for significant hypertension currently 210/123.  Patient is anxious in appearance.  We will check labs and continue to closely monitor.  Patient has a somewhat abnormal EKG however in reviewing the patient's care everywhere has a similar read on his EKG from 05/21/2019.  We will check cardiac  enzymes as a precaution.  We will continue to closely monitor while awaiting lab results in the emergency department.  We will treat anxiety while awaiting results.  Patient had a fairly normal blood pressure during his last cardiology visit per care everywhere.  Given the patient's cough and congestion 2 days ago ordered a Covid test however patient is refusing at this time.  Patient continues to appear well.  Lab work largely nonrevealing.  Troponin is negative.  EKG compared to his old EKG in care everywhere appears to be largely unchanged left bundle branch block.  Patient refused the Ativan as well.  Does appear somewhat anxious, blood pressure is down to 179/95.  Patient is on blood pressure medication but only takes it at night.  I discussed with the patient taking his blood pressure medication this evening and rechecking his blood pressure tomorrow morning.  Patient will follow up with his PCP.   05/23/2019  Timothy Reeves. was evaluated in Emergency Department on 03/29/2020 for the symptoms described in the history of present illness. He was evaluated in the context of the global COVID-19 pandemic, which necessitated consideration that the patient might be at risk for infection with the SARS-CoV-2 virus that causes COVID-19. Institutional protocols and algorithms that pertain to the evaluation of patients at risk for COVID-19 are in a state of rapid change based on information released by regulatory bodies including the CDC and federal and state organizations. These policies and algorithms were followed during the patient's care in the ED.  ____________________________________________   FINAL CLINICAL IMPRESSION(S) / ED DIAGNOSES  Dizziness Hypertension   Minna Antis, MD 03/29/20 1258

## 2020-03-29 NOTE — ED Notes (Signed)
Pt refusing his covid test for now , if admitted pt will take the test

## 2020-03-29 NOTE — ED Notes (Signed)
Pt refused nasal swab

## 2020-03-29 NOTE — ED Triage Notes (Signed)
Pt sent from [facility] with c/o HA dizziness since Sunday, states Saturday he had some congestion/cough but none of those sx since. Pt is a/ox4 on arrival,

## 2020-03-31 ENCOUNTER — Other Ambulatory Visit: Payer: Self-pay

## 2020-03-31 ENCOUNTER — Ambulatory Visit
Admission: RE | Admit: 2020-03-31 | Discharge: 2020-03-31 | Disposition: A | Discharge status: Home / Self Care | Payer: Medicare PPO | Source: Ambulatory Visit | Attending: Nurse Practitioner | Admitting: Nurse Practitioner

## 2020-03-31 DIAGNOSIS — Z87891 Personal history of nicotine dependence: Secondary | ICD-10-CM

## 2020-03-31 DIAGNOSIS — R918 Other nonspecific abnormal finding of lung field: Secondary | ICD-10-CM | POA: Insufficient documentation

## 2020-04-08 DIAGNOSIS — R911 Solitary pulmonary nodule: Secondary | ICD-10-CM | POA: Insufficient documentation

## 2020-04-11 ENCOUNTER — Telehealth: Payer: Self-pay | Admitting: *Deleted

## 2020-04-11 NOTE — Telephone Encounter (Signed)
Notified patient of LDCT lung cancer screening program results with recommendation for 3 month follow up imaging. Also notified of incidental findings noted below and is encouraged to discuss further with PCP who will receive a copy of this note and/or the CT report. Patient verbalizes understanding.   IMPRESSION: 1. Lung-RADS 4A, suspicious. Follow up low-dose chest CT without contrast in 3 months (please use the following order, "CT CHEST LCS NODULE FOLLOW-UP W/O CM") is recommended. Alternatively, PET may be considered when there is a solid component 61mm or larger. 2. Aortic Atherosclerosis (ICD10-I70.0) and Emphysema (ICD10-J43.9). 3. Coronary artery atherosclerotic calcifications.

## 2020-04-13 ENCOUNTER — Other Ambulatory Visit: Payer: Self-pay | Admitting: *Deleted

## 2020-04-13 DIAGNOSIS — Z87891 Personal history of nicotine dependence: Secondary | ICD-10-CM

## 2020-04-13 DIAGNOSIS — R918 Other nonspecific abnormal finding of lung field: Secondary | ICD-10-CM

## 2020-04-13 NOTE — Progress Notes (Signed)
Contacted and scheduled for LCS nodule follow up scan. Patient is a former smoker, quit 2010, 37 pack year history.

## 2020-07-07 ENCOUNTER — Ambulatory Visit
Admission: RE | Admit: 2020-07-07 | Discharge: 2020-07-07 | Disposition: A | Discharge status: Home / Self Care | Payer: Medicare PPO | Source: Ambulatory Visit | Attending: Nurse Practitioner | Admitting: Nurse Practitioner

## 2020-07-07 ENCOUNTER — Other Ambulatory Visit: Payer: Self-pay

## 2020-07-07 DIAGNOSIS — R918 Other nonspecific abnormal finding of lung field: Secondary | ICD-10-CM

## 2020-07-07 DIAGNOSIS — Z87891 Personal history of nicotine dependence: Secondary | ICD-10-CM | POA: Diagnosis not present

## 2020-07-11 ENCOUNTER — Telehealth: Payer: Self-pay | Admitting: *Deleted

## 2020-07-11 NOTE — Telephone Encounter (Signed)
Notified patient of LDCT lung cancer screening program results with recommendation for 12 month follow up imaging. Also notified of incidental findings noted below and is encouraged to discuss further with PCP who will receive a copy of this note and/or the CT report. Patient verbalizes understanding.    Patient has already discussed with PCP office that he would like to discuss with PCP the lung inflammation/infection finding since he report productive sputum as well.   IMPRESSION: Lung-RADS 2, benign appearance or behavior. Continue annual screening with low-dose chest CT without contrast in 12 months.   Mild patchy opacities in the bilateral lung bases, new, suggesting mild infection/pneumonia versus post infectious/inflammatory scarring.   Aortic Atherosclerosis (ICD10-I70.0) and Emphysema (ICD10-J43.9).

## 2020-07-20 DIAGNOSIS — Z8616 Personal history of COVID-19: Secondary | ICD-10-CM

## 2020-07-20 HISTORY — DX: Personal history of COVID-19: Z86.16

## 2020-07-25 ENCOUNTER — Encounter (INDEPENDENT_AMBULATORY_CARE_PROVIDER_SITE_OTHER): Payer: Self-pay | Admitting: Vascular Surgery

## 2020-08-24 DIAGNOSIS — E785 Hyperlipidemia, unspecified: Secondary | ICD-10-CM | POA: Insufficient documentation

## 2020-08-24 DIAGNOSIS — I6529 Occlusion and stenosis of unspecified carotid artery: Secondary | ICD-10-CM | POA: Insufficient documentation

## 2020-08-24 DIAGNOSIS — K219 Gastro-esophageal reflux disease without esophagitis: Secondary | ICD-10-CM | POA: Insufficient documentation

## 2020-08-24 NOTE — Progress Notes (Signed)
MRN : [id number]  Timothy Steinfeldt. is a 65 y.o. (11-29-1955) male who presents with chief complaint of carotid stenosis.  History of Present Illness:   The patient is seen for evaluation of carotid stenosis. The carotid stenosis was identified after a carotid bruit was auscultated.  The patient denies amaurosis fugax. There is no recent history of TIA symptoms or focal motor deficits. There is no prior documented CVA.  There is no history of migraine headaches. There is no history of seizures.  The patient is taking enteric-coated aspirin 81 mg daily.  The patient does not have a history of coronary artery disease, no recent episodes of angina or shortness of breath. The patient denies PAD or claudication symptoms. There is a history of hyperlipidemia which is being treated with a statin.    Duplex ultrasound of the carotid arteries shows <50%  bilateral carotid stenosis  No outpatient medications have been marked as taking for the 08/25/20 encounter (Appointment) with Gilda Crease, Latina Craver, MD.    Past Medical History:  Diagnosis Date   Hypertension     Past Surgical History:  Procedure Laterality Date   CARDIAC CATHETERIZATION      Social History Social History   Tobacco Use   Smoking status: Former    Packs/day: 1.00    Years: 37.00    Pack years: 37.00    Types: Cigarettes    Quit date: 10/04/2008    Years since quitting: 11.8   Smokeless tobacco: Never  Substance Use Topics   Alcohol use: Yes    Alcohol/week: 2.0 standard drinks    Types: 2 Cans of beer per week   Drug use: Not Currently    Family History No family history on file.  No Known Allergies   REVIEW OF SYSTEMS (Negative unless checked)  Constitutional: [] Weight loss  [] Fever  [] Chills Cardiac: [] Chest pain   [] Chest pressure   [] Palpitations   [] Shortness of breath when laying flat   [] Shortness of breath with exertion. Vascular:  [] Pain in legs with walking   [] Pain in legs at rest   [] History of DVT   [] Phlebitis   [] Swelling in legs   [] Varicose veins   [] Non-healing ulcers Pulmonary:   [] Uses home oxygen   [] Productive cough   [] Hemoptysis   [] Wheeze  [] COPD   [] Asthma Neurologic:  [] Dizziness   [] Seizures   [] History of stroke   [] History of TIA  [] Aphasia   [] Vissual changes   [] Weakness or numbness in arm   [] Weakness or numbness in leg Musculoskeletal:   [] Joint swelling   [] Joint pain   [] Low back pain Hematologic:  [] Easy bruising  [] Easy bleeding   [] Hypercoagulable state   [] Anemic Gastrointestinal:  [] Diarrhea   [] Vomiting  [x] Gastroesophageal reflux/heartburn   [] Difficulty swallowing. Genitourinary:  [] Chronic kidney disease   [] Difficult urination  [] Frequent urination   [] Blood in urine Skin:  [] Rashes   [] Ulcers  Psychological:  [] History of anxiety   []  History of major depression.  Physical Examination  There were no vitals filed for this visit. There is no height or weight on file to calculate BMI. Gen: WD/WN, NAD Head: [place]/AT, No temporalis wasting.  Ear/Nose/Throat: Hearing grossly intact, nares w/o erythema or drainage Eyes: PER, EOMI, sclera nonicteric.  Neck: Supple, no masses.  No bruit or JVD.  Pulmonary:  Good air movement, no audible wheezing, no use of accessory muscles.  Cardiac: RRR, normal S1, S2, no Murmurs. Vascular:   left carotid bruit Vessel Right  Left  Radial Palpable Palpable  Carotid Palpable Palpable  Gastrointestinal: soft, non-distended. No guarding/no peritoneal signs.  Musculoskeletal: M/S 5/5 throughout.  No visible deformity.  Neurologic: CN 2-12 intact. Pain and light touch intact in extremities.  Symmetrical.  Speech is fluent. Motor exam as listed above. Psychiatric: Judgment intact, Mood & affect appropriate for pt's clinical situation. Dermatologic: No rashes or ulcers noted.  No changes consistent with cellulitis.   CBC Lab Results  Component Value Date   WBC 5.1 03/29/2020   HGB 13.8 03/29/2020   HCT 39.9  03/29/2020   MCV 87.9 03/29/2020   PLT 205 03/29/2020    BMET    Component Value Date/Time   NA 138 03/29/2020 1027   K 4.2 03/29/2020 1027   CL 107 03/29/2020 1027   CO2 24 03/29/2020 1027   GLUCOSE 105 (H) 03/29/2020 1027   BUN 21 03/29/2020 1027   CREATININE 0.81 03/29/2020 1027   CALCIUM 9.3 03/29/2020 1027   GFRNONAA >60 03/29/2020 1027   CrCl cannot be calculated (Patient's most recent lab result is older than the maximum 21 days allowed.).  COAG No results found for: INR, PROTIME  Radiology No results found.   Assessment/Plan 1. Bilateral carotid artery stenosis Recommend:  Given the patient's asymptomatic subcritical stenosis no further invasive testing or surgery at this time.  Duplex ultrasound shows <50% stenosis bilaterally.  Continue antiplatelet therapy as prescribed Continue management of CAD, HTN and Hyperlipidemia Healthy heart diet,  encouraged exercise at least 4 times per week Follow up in 12 months with duplex ultrasound and physical exam   - VAS US CAROTID; Future  2. Mixed hyperlipidemia Continue statin as ordered and reviewed, no changes at this time   3. Gastroesophageal reflux disease without esophagitis Continue PPI as already ordered, this medication has been reviewed and there are no changes at this time.  Avoidence of caffeine and alcohol  Moderate elevation of the head of the bed      Levora Dredge, MD  08/24/2020 6:22 PM

## 2020-08-25 ENCOUNTER — Ambulatory Visit (INDEPENDENT_AMBULATORY_CARE_PROVIDER_SITE_OTHER): Payer: Medicare PPO | Admitting: Vascular Surgery

## 2020-08-25 ENCOUNTER — Other Ambulatory Visit: Payer: Self-pay

## 2020-08-25 ENCOUNTER — Encounter (INDEPENDENT_AMBULATORY_CARE_PROVIDER_SITE_OTHER): Payer: Self-pay | Admitting: Vascular Surgery

## 2020-08-25 DIAGNOSIS — E782 Mixed hyperlipidemia: Secondary | ICD-10-CM | POA: Diagnosis not present

## 2020-08-25 DIAGNOSIS — I6523 Occlusion and stenosis of bilateral carotid arteries: Secondary | ICD-10-CM | POA: Diagnosis not present

## 2020-08-25 DIAGNOSIS — L930 Discoid lupus erythematosus: Secondary | ICD-10-CM | POA: Insufficient documentation

## 2020-08-25 DIAGNOSIS — K219 Gastro-esophageal reflux disease without esophagitis: Secondary | ICD-10-CM | POA: Diagnosis not present

## 2020-08-25 DIAGNOSIS — K2100 Gastro-esophageal reflux disease with esophagitis, without bleeding: Secondary | ICD-10-CM | POA: Insufficient documentation

## 2020-08-28 ENCOUNTER — Encounter (INDEPENDENT_AMBULATORY_CARE_PROVIDER_SITE_OTHER): Payer: Self-pay | Admitting: Vascular Surgery

## 2020-09-02 ENCOUNTER — Other Ambulatory Visit: Payer: Self-pay | Admitting: Otolaryngology

## 2020-09-02 DIAGNOSIS — R42 Dizziness and giddiness: Secondary | ICD-10-CM

## 2020-09-14 ENCOUNTER — Ambulatory Visit
Admission: RE | Admit: 2020-09-14 | Discharge: 2020-09-14 | Disposition: A | Discharge status: Home / Self Care | Payer: Medicare PPO | Source: Ambulatory Visit | Attending: Otolaryngology | Admitting: Otolaryngology

## 2020-09-14 ENCOUNTER — Other Ambulatory Visit: Payer: Self-pay

## 2020-09-14 DIAGNOSIS — I6782 Cerebral ischemia: Secondary | ICD-10-CM | POA: Insufficient documentation

## 2020-09-14 DIAGNOSIS — R42 Dizziness and giddiness: Secondary | ICD-10-CM

## 2020-09-14 DIAGNOSIS — G319 Degenerative disease of nervous system, unspecified: Secondary | ICD-10-CM | POA: Insufficient documentation

## 2020-09-14 MED ORDER — GADOBUTROL 1 MMOL/ML IV SOLN
9.0000 mL | Freq: Once | INTRAVENOUS | Status: AC | PRN
  Administered 2020-09-14: 9 mL via INTRAVENOUS

## 2020-09-19 ENCOUNTER — Other Ambulatory Visit (HOSPITAL_COMMUNITY): Payer: Self-pay | Admitting: Neuroradiology

## 2020-09-19 ENCOUNTER — Telehealth (HOSPITAL_COMMUNITY): Payer: Self-pay

## 2020-09-19 DIAGNOSIS — I671 Cerebral aneurysm, nonruptured: Secondary | ICD-10-CM

## 2020-09-19 NOTE — Telephone Encounter (Signed)
Called to schedule consult with Dr. Rodrigues, no answer, left vm. AW  

## 2020-09-20 ENCOUNTER — Ambulatory Visit (HOSPITAL_COMMUNITY)
Admission: RE | Admit: 2020-09-20 | Discharge: 2020-09-20 | Disposition: A | Discharge status: Home / Self Care | Payer: Medicare PPO | Source: Ambulatory Visit | Attending: Neuroradiology | Admitting: Neuroradiology

## 2020-09-20 ENCOUNTER — Other Ambulatory Visit: Payer: Self-pay

## 2020-09-20 DIAGNOSIS — I671 Cerebral aneurysm, nonruptured: Secondary | ICD-10-CM

## 2020-09-20 NOTE — Consult Note (Signed)
Chief Complaint: Patient was seen in consultation today for brain aneurysms.  Referring Physician(s): Bud Face, MD  Supervising Physician: Baldemar Lenis  Patient Status: [facility] - Out-pt  History of Present Illness: Timothy Reeves. is a 65 year old male with past medical history significant for coronary artery disease, discoid lupus, erectile dysfunction, gastroesophageal reflux disease with esophagitis, generalized anxiety disorder, hypertension, psoriatic arthritis, hypercholesterolemia and remote history of tobacco use having quit 12 years ago (37 pack years).  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  His family history is significant for mom passing away due to a ruptured brain aneurysm.  He comes today to discuss imaging findings.    Past Medical History:  Diagnosis Date   Hypertension     Past Surgical History:  Procedure Laterality Date   CARDIAC CATHETERIZATION      Allergies: Atorvastatin  Medications: Prior to Admission medications   Medication Sig Start Date End Date Taking? Authorizing Provider  albuterol (VENTOLIN HFA) 108 (90 Base) MCG/ACT inhaler Inhale into the lungs. 03/29/20 03/29/21  [provider]  atorvastatin (LIPITOR) 20 MG tablet TAKE 1/2 TABLET EVERY NIGHT Patient not taking: Reported on 08/25/2020 05/18/15   [provider]  betamethasone dipropionate 0.05 % cream Apply topically 2 (two) times daily as needed (Rash). 10/21/19   Deirdre Evener, MD  calcipotriene (DOVONOX) 0.005 % cream Apply to aa's psoriasis QOD alternating with Triamcinolone. 11/12/19   Deirdre Evener, MD  Calcium Carb-Cholecalciferol 600-400 MG-UNIT TABS Take by mouth. 10/09/17   [provider]  Cyanocobalamin (B-12) 1000 MCG TABS Take 1 tablet by mouth daily. 07/02/20   [provider]  doxycycline (VIBRA-TABS) 100 MG tablet Take 100 mg by mouth 2 (two) times daily. 08/23/20   [provider]  esomeprazole (NEXIUM) 20 MG capsule Take 20 mg by mouth daily. 08/22/20   [provider]  etanercept (ENBREL SURECLICK) 50 MG/ML injection Inject into the skin. Patient not taking: Reported on 08/25/2020 02/09/19   [provider]  levofloxacin (LEVAQUIN) 500 MG tablet Take 500 mg by mouth daily. 07/21/20   [provider]  losartan (COZAAR) 100 MG tablet Take 100 mg by mouth daily. 07/18/20   [provider]  naproxen (NAPROSYN) 500 MG tablet Take by mouth. 09/26/15   [provider]  omeprazole (PRILOSEC) 20 MG capsule TAKE 1 CAPSULE BY MOUTH EVERY OTHER DAY 05/06/15   [provider]  pantoprazole (PROTONIX) 40 MG tablet Take 40 mg by mouth daily. 06/16/20   [provider]  PARoxetine (PAXIL) 20 MG tablet TAKE 1/2 TABLET AT BEDTIME 07/28/15   [provider]  predniSONE (DELTASONE) 10 MG tablet Take by mouth. 08/23/20   [provider]  rosuvastatin (CRESTOR) 20 MG tablet Take 20 mg by mouth daily. 08/10/20   [provider]  sildenafil (VIAGRA) 100 MG tablet Take by mouth. Patient not taking: Reported on 08/25/2020    [provider]  triamcinolone cream (KENALOG) 0.1 % APPLY TO AFFECTED AREAS (PSORIASIS) TWICE DAILY AS NEEDED FOR FLARES. AVOID FACE, GROIN, AXILLA. 07/20/19   Deirdre Evener, MD     No family history on file.  Social History   Socioeconomic History   Marital status: Married    Spouse name: Not on file   Number of children: Not on file  Years of education: Not on file   Highest education level: Not on file  Occupational History   Not on file  Tobacco Use   Smoking status: Former    Packs/day: 1.00    Years: 37.00    Pack years: 37.00    Types: Cigarettes    Quit date: 10/04/2008    Years since quitting: 11.9   Smokeless tobacco: Never   Substance and Sexual Activity   Alcohol use: Yes    Alcohol/week: 2.0 standard drinks    Types: 2 Cans of beer per week   Drug use: Not Currently   Sexual activity: Not on file  Other Topics Concern   Not on file  Social History Narrative   Not on file   Social Determinants of Health   Financial Resource Strain: Not on file  Food Insecurity: Not on file  Transportation Needs: Not on file  Physical Activity: Not on file  Stress: Not on file  Social Connections: Not on file     Review of Systems: A 12 point ROS discussed and pertinent positives are indicated in the HPI above.  All other systems are negative.  Review of Systems  Vital Signs: There were no vitals taken for this visit.  Physical Exam Constitutional:      Appearance: Normal appearance.  Eyes:     General: No visual field deficit.    Extraocular Movements: Extraocular movements intact.     Pupils: Pupils are equal, round, and reactive to light.  Neurological:     Mental Status: He is alert and oriented to person, place, and time. Mental status is at baseline.     Cranial Nerves: No cranial nerve deficit, dysarthria or facial asymmetry.     Sensory: No sensory deficit.     Motor: Weakness present. No pronator drift.     Comments: Very mild LLE weakness that patient refers to be long stanging and related to his left knee.         Imaging: MR ANGIO HEAD WO CONTRAST  Addendum Date: 09/15/2020   ADDENDUM REPORT: 09/15/2020 10:07 ADDENDUM: MRA impressions 1 and 2 called by telephone on 09/15/2020 at 10:07 am to provider [facility] , who verbally acknowledged these results. Electronically Signed   By: Jackey Loge D.O.   On: 09/15/2020 10:07   Result Date: 09/15/2020 CLINICAL DATA:  Dizziness. Additional history provided by scanning technologist: Patient reports dizziness when looking up, ringing in both ears, symptoms for years. EXAM: MRI HEAD WITHOUT AND WITH CONTRAST MRA HEAD WITHOUT CONTRAST  TECHNIQUE: Multiplanar, multi-echo pulse sequences of the brain and surrounding structures were acquired without and with intravenous contrast. Angiographic images of the Circle of Willis were acquired using MRA technique without intravenous contrast. CONTRAST:  58mL GADAVIST GADOBUTROL 1 MMOL/ML IV SOLN COMPARISON:  No pertinent prior exam. FINDINGS: MRI HEAD FINDINGS Brain: Mild generalized cerebral atrophy. Mild multifocal T2/FLAIR hyperintensity within the cerebral white matter, nonspecific but compatible with chronic small vessel ischemic disease. Small chronic lacunar infarcts within the bilateral basal ganglia. 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 No evidence of an intracranial mass. Specifically, no cerebellopontine angle or internal auditory canal mass is demonstrated. Unremarkable appearance of the seventh and eighth cranial nerves bilaterally. There is no acute infarct. No chronic intracranial blood products. No extra-axial fluid collection. No midline shift. Incidentally noted cavum septum pellucidum and cavum vergae. Left M1/M2 MCA aneurysm as described below. Otherwise, no pathologic intracranial enhancement. Vascular: Maintained flow voids within the proximal large  arterial vessels. Please see concurrently performed MRA for a description of a left M1/M2 MCA aneurysm. Skull and upper cervical spine: No focal suspicious marrow lesion. Sinuses/Orbits: Visualized orbits show no acute finding. Mild mucosal thickening within the bilateral ethmoid air cells. Mild mucosal thickening within the right maxillary sinus. Moderate mucosal thickening within the left maxillary sinus. Other: Right mastoid effusion. MRA HEAD FINDINGS Anterior circulation: The intracranial internal carotid arteries are patent. Duplicated left middle cerebral artery. The M1 middle cerebral arteries are patent. No M2 proximal branch occlusion or high-grade proximal stenosis is identified. The anterior  cerebral arteries are patent. 5 mm inferolaterally projecting aneurysm arising from the supraclinoid left ICA (for instance as seen on series 5, image 79). 9 x 6 mm saccular aneurysm arising from the left middle cerebral artery at the M1/M2 junction (series 5, image 98). Posterior circulation: The intracranial vertebral arteries are patent. Left vertebral artery dominant. The basilar artery is patent. The posterior cerebral arteries are patent. Posterior communicating arteries are hypoplastic or absent bilaterally. Anatomic variants: As described. MRA head impressions #1 and #2 will be called to the ordering clinician or representative by the Radiologist Assistant, and communication documented in the PACS or [employer]. IMPRESSION: MRI brain: 1. No evidence of acute intracranial abnormality. 2. No cerebellopontine angle or internal auditory canal mass. 3. Mild chronic small-vessel ischemic changes within the cerebral white matter. 4. Small chronic lacunar infarcts within the bilateral basal ganglia. 5. 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 6. Mild generalized cerebral atrophy. 7. Paranasal sinus disease, as described. 8. Right mastoid effusion. MRA head: 1. Duplicated left middle cerebral artery. There is a 9 x 6 mm saccular aneurysm arising from the left middle cerebral artery at the M1/M2 junction. Neuro-interventional consultation is recommended. 2. 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 Neuro-interventional consultation is recommended. 3. No intracranial large vessel occlusion or proximal high-grade arterial stenosis. Electronically Signed: By: Jackey Loge D.O. On: 09/15/2020 09:12   MR BRAIN/IAC W WO CONTRAST  Addendum Date: 09/15/2020   ADDENDUM REPORT: 09/15/2020 10:07 ADDENDUM: MRA impressions 1 and 2 called by telephone on 09/15/2020 at 10:07 am to provider [facility] , who verbally acknowledged these results.  Electronically Signed   By: Jackey Loge D.O.   On: 09/15/2020 10:07   Result Date: 09/15/2020 CLINICAL DATA:  Dizziness. Additional history provided by scanning technologist: Patient reports dizziness when looking up, ringing in both ears, symptoms for years. EXAM: MRI HEAD WITHOUT AND WITH CONTRAST MRA HEAD WITHOUT CONTRAST TECHNIQUE: Multiplanar, multi-echo pulse sequences of the brain and surrounding structures were acquired without and with intravenous contrast. Angiographic images of the Circle of Willis were acquired using MRA technique without intravenous contrast. CONTRAST:  37mL GADAVIST GADOBUTROL 1 MMOL/ML IV SOLN COMPARISON:  No pertinent prior exam. FINDINGS: MRI HEAD FINDINGS Brain: Mild generalized cerebral atrophy. Mild multifocal T2/FLAIR hyperintensity within the cerebral white matter, nonspecific but compatible with chronic small vessel ischemic disease. Small chronic lacunar infarcts within the bilateral basal ganglia. 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 No evidence of an intracranial mass. Specifically, no cerebellopontine angle or internal auditory canal mass is demonstrated. Unremarkable appearance of the seventh and eighth cranial nerves bilaterally. There is no acute infarct. No chronic intracranial blood products. No extra-axial fluid collection. No midline shift. Incidentally noted cavum septum pellucidum and cavum vergae. Left M1/M2 MCA aneurysm as described below. Otherwise, no pathologic intracranial enhancement. Vascular: Maintained flow voids within the proximal  large arterial vessels. Please see concurrently performed MRA for a description of a left M1/M2 MCA aneurysm. Skull and upper cervical spine: No focal suspicious marrow lesion. Sinuses/Orbits: Visualized orbits show no acute finding. Mild mucosal thickening within the bilateral ethmoid air cells. Mild mucosal thickening within the right maxillary sinus. Moderate mucosal thickening within the  left maxillary sinus. Other: Right mastoid effusion. MRA HEAD FINDINGS Anterior circulation: The intracranial internal carotid arteries are patent. Duplicated left middle cerebral artery. The M1 middle cerebral arteries are patent. No M2 proximal branch occlusion or high-grade proximal stenosis is identified. The anterior cerebral arteries are patent. 5 mm inferolaterally projecting aneurysm arising from the supraclinoid left ICA (for instance as seen on series 5, image 79). 9 x 6 mm saccular aneurysm arising from the left middle cerebral artery at the M1/M2 junction (series 5, image 98). Posterior circulation: The intracranial vertebral arteries are patent. Left vertebral artery dominant. The basilar artery is patent. The posterior cerebral arteries are patent. Posterior communicating arteries are hypoplastic or absent bilaterally. Anatomic variants: As described. MRA head impressions #1 and #2 will be called to the ordering clinician or representative by the Radiologist Assistant, and communication documented in the PACS or [employer]. IMPRESSION: MRI brain: 1. No evidence of acute intracranial abnormality. 2. No cerebellopontine angle or internal auditory canal mass. 3. Mild chronic small-vessel ischemic changes within the cerebral white matter. 4. Small chronic lacunar infarcts within the bilateral basal ganglia. 5. 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 6. Mild generalized cerebral atrophy. 7. Paranasal sinus disease, as described. 8. Right mastoid effusion. MRA head: 1. Duplicated left middle cerebral artery. There is a 9 x 6 mm saccular aneurysm arising from the left middle cerebral artery at the M1/M2 junction. Neuro-interventional consultation is recommended. 2. 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 Neuro-interventional consultation is recommended. 3. No intracranial large vessel occlusion or proximal high-grade arterial  stenosis. Electronically Signed: By: Jackey Loge D.O. On: 09/15/2020 09:12    Labs:  CBC: Recent Labs    03/29/20 1027  WBC 5.1  HGB 13.8  HCT 39.9  PLT 205    COAGS: No results for input(s): INR, APTT in the last 8760 hours.  BMP: Recent Labs    03/29/20 1027  NA 138  K 4.2  CL 107  CO2 24  GLUCOSE 105*  BUN 21  CALCIUM 9.3  CREATININE 0.81  GFRNONAA >60    LIVER FUNCTION TESTS: Recent Labs    03/29/20 1027  BILITOT 1.2  AST 23  ALT 19  ALKPHOS 77  PROT 7.4  ALBUMIN 4.1    TUMOR MARKERS: No results for input(s): AFPTM, CEA, CA199, CHROMGRNA in the last 8760 hours.  Assessment and Plan:  I reviewed imaging findings, the natural history of brain aneurysms and rupture risk with Timothy Reeves.  We discussed management options including conservative management with serial imaging, open neuro surgical clipping and endovascular embolization.  I suggested that we obtained a diagnostic cerebral angiogram to better evaluate aneurysms size and morphology as well as its relationship with the parent vessel to better assist on treatment planning.  He is agreeable with the plan and would like to proceed with a diagnostic cerebral angiogram.  Our scheduler will reach out to him to book the diagnostic cerebral angiogram under moderate sedation.    Thank you for this interesting consult.  I greatly enjoyed meeting Timothy Reeves. and look forward to participating in their care.  A copy of this report was sent to the requesting provider on this date.  Electronically Signed: Baldemar Lenis, MD 09/20/2020, 1:02 PM   I spent a total of  40 Minutes   in face to face in clinical consultation, greater than 50% of which was counseling/coordinating care for brain aneurysms.

## 2020-09-21 ENCOUNTER — Other Ambulatory Visit: Payer: Self-pay | Admitting: Radiology

## 2020-09-21 ENCOUNTER — Other Ambulatory Visit (HOSPITAL_COMMUNITY): Payer: Self-pay | Admitting: Neuroradiology

## 2020-09-21 ENCOUNTER — Telehealth (HOSPITAL_COMMUNITY): Payer: Self-pay

## 2020-09-21 DIAGNOSIS — I671 Cerebral aneurysm, nonruptured: Secondary | ICD-10-CM

## 2020-09-21 NOTE — Telephone Encounter (Signed)
Called to schedule diagnostic angiogram with Dr. Rodrigues, no answer, left vm. AW  

## 2020-09-21 NOTE — H&P (Signed)
Chief Complaint: Patient was seen in consultation today for brain aneurysms  Supervising Physician: Pedro Earls  Patient Status: [facility] - Out-pt  History of Present Illness: Timothy Reeves. is a 65 y.o. 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 He recently met with Dr. deKarenann Cain consultation 9/20 to discuss management of his aneurysms.  It was suggested that a diagnostic angiogram for better assessement and treatment planning would be warranted.  Patient agrees and elects to proceed.  [facility] in his usual state of health.  Denies new concerns or complaints.  No focal neurological symptoms today. He has been NPO. He does have a ride home this afternoon.    Past Medical History:  Diagnosis Date   Hypertension     Past Surgical History:  Procedure Laterality Date   CARDIAC CATHETERIZATION      Allergies: Atorvastatin  Medications: Prior to Admission medications   Medication Sig Start Date End Date Taking? Authorizing Provider  albuterol (VENTOLIN HFA) 108 (90 Base) MCG/ACT inhaler Inhale into the lungs. 03/29/20 03/29/21  [provider]  atorvastatin (LIPITOR) 20 MG tablet TAKE 1/2 TABLET EVERY NIGHT Patient not taking: Reported on 08/25/2020 05/18/15   [provider]  betamethasone dipropionate 0.05 % cream Apply topically 2 (two) times daily as needed (Rash). 10/21/19   KoRalene BatheMD  calcipotriene (DOVONOX) 0.005 % cream Apply to aa's psoriasis QOD alternating with Triamcinolone. 11/12/19   KoRalene BatheMD  Calcium Carb-Cholecalciferol 600-400 MG-UNIT TABS Take  by mouth. 10/09/17   [provider]  Cyanocobalamin (B-12) 1000 MCG TABS Take 1 tablet by mouth daily. 07/02/20   [provider]  doxycycline (VIBRA-TABS) 100 MG tablet Take 100 mg by mouth 2 (two) times daily. 08/23/20   [provider]  esomeprazole (NEXIUM) 20 MG capsule Take 20 mg by mouth daily. 08/22/20   [provider]  etanercept (ENBREL SURECLICK) 50 MG/ML injection Inject into the skin. Patient not taking: Reported on 08/25/2020 02/09/19   [provider]  levofloxacin (LEVAQUIN) 500 MG tablet Take 500 mg by mouth daily. 07/21/20   [provider]  losartan (COZAAR) 100 MG tablet Take 100 mg by mouth daily. 07/18/20   [provider]  naproxen (NAPROSYN) 500 MG tablet Take by mouth. 09/26/15   [provider]  omeprazole (PRILOSEC) 20 MG capsule TAKE 1 CAPSULE BY MOUTH EVERY OTHER DAY 05/06/15   [provider]  pantoprazole (PROTONIX) 40 MG tablet Take 40 mg by mouth daily. 06/16/20   [provider]  PARoxetine (PAXIL) 20 MG tablet TAKE 1/2 TABLET AT BEDTIME 07/28/15   [provider]  predniSONE (DELTASONE) 10 MG tablet Take by mouth. 08/23/20   [provider]  rosuvastatin (CRESTOR) 20 MG tablet Take 20 mg by mouth daily. 08/10/20   [provider]  sildenafil (VIAGRA) 100 MG tablet Take by mouth. Patient not taking: Reported on 08/25/2020    [provider]  triamcinolone cream (KENALOG) 0.1 % APPLY TO AFFECTED AREAS (PSORIASIS) TWICE DAILY AS NEEDED FOR FLARES. AVOID FACE, GROIN, AXILLA. 07/20/19   KoRalene BatheMD     History reviewed. No pertinent family history.  Social History  Socioeconomic History   Marital status: Married    Spouse name: Not on file   Number of children: Not on file   Years of education: Not on file   Highest education level: Not on file  Occupational History   Not on file  Tobacco Use   Smoking status: Former    Packs/day: 1.00     Years: 37.00    Pack years: 37.00    Types: Cigarettes    Quit date: 10/04/2008    Years since quitting: 11.9   Smokeless tobacco: Never  Substance and Sexual Activity   Alcohol use: Yes    Alcohol/week: 2.0 standard drinks    Types: 2 Cans of beer per week   Drug use: Not Currently   Sexual activity: Not on file  Other Topics Concern   Not on file  Social History Narrative   Not on file   Social Determinants of Health   Financial Resource Strain: Not on file  Food Insecurity: Not on file  Transportation Needs: Not on file  Physical Activity: Not on file  Stress: Not on file  Social Connections: Not on file     Review of Systems: A 12 point ROS discussed and pertinent positives are indicated in the HPI above.  All other systems are negative.  Review of Systems  Constitutional:  Negative for fatigue and fever.  Respiratory:  Negative for cough and shortness of breath.   Cardiovascular:  Negative for chest pain.  Gastrointestinal:  Negative for abdominal pain, nausea and vomiting.  Musculoskeletal:  Negative for back pain.  Psychiatric/Behavioral:  Negative for behavioral problems and confusion.    Vital Signs: BP (!) 172/97   Pulse 76   Temp 98 F (36.7 C) (Oral)   Resp 15   Ht 6' 1"  (1.854 m)   Wt 194 lb (88 kg)   SpO2 99%   BMI 25.60 kg/m   Physical Exam Vitals and nursing note reviewed.  Constitutional:      General: He is not in acute distress.    Appearance: Normal appearance. He is not ill-appearing.  HENT:     Mouth/Throat:     Mouth: Mucous membranes are moist.     Pharynx: Oropharynx is clear.  Cardiovascular:     Rate and Rhythm: Normal rate and regular rhythm.     Pulses: Normal pulses.     Heart sounds: Normal heart sounds.  Pulmonary:     Effort: Pulmonary effort is normal.     Breath sounds: Normal breath sounds.  Abdominal:     General: Abdomen is flat. There is no distension.     Palpations: Abdomen is soft.  Musculoskeletal:      Cervical back: Normal range of motion and neck supple.  Skin:    General: Skin is warm and dry.  Neurological:     General: No focal deficit present.     Mental Status: He is alert and oriented to person, place, and time. Mental status is at baseline.  Psychiatric:        Mood and Affect: Mood normal.        Behavior: Behavior normal.        Thought Content: Thought content normal.        Judgment: Judgment normal.     MD Evaluation Airway: WNL Heart: WNL Abdomen: WNL Chest/ Lungs: WNL ASA  Classification: 3 Mallampati/Airway Score: Two   Imaging: MR ANGIO HEAD WO CONTRAST  Addendum Date: 09/15/2020   ADDENDUM REPORT: 09/15/2020 10:07  ADDENDUM: MRA impressions 1 and 2 called by telephone on 09/15/2020 at 10:07 am to provider [facility] , who verbally acknowledged these results. Electronically Signed   By: Kellie Simmering D.O.   On: 09/15/2020 10:07   Result Date: 09/15/2020 CLINICAL DATA:  Dizziness. Additional history provided by scanning technologist: Patient reports dizziness when looking up, ringing in both ears, symptoms for years. EXAM: MRI HEAD WITHOUT AND WITH CONTRAST MRA HEAD WITHOUT CONTRAST TECHNIQUE: Multiplanar, multi-echo pulse sequences of the brain and surrounding structures were acquired without and with intravenous contrast. Angiographic images of the Circle of Willis were acquired using MRA technique without intravenous contrast. CONTRAST:  81m GADAVIST GADOBUTROL 1 MMOL/ML IV SOLN COMPARISON:  No pertinent prior exam. FINDINGS: MRI HEAD FINDINGS Brain: Mild generalized cerebral atrophy. Mild multifocal T2/FLAIR hyperintensity within the cerebral white matter, nonspecific but compatible with chronic small vessel ischemic disease. Small chronic lacunar infarcts within the bilateral basal ganglia. 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 No evidence of an intracranial mass. Specifically, no cerebellopontine angle or internal auditory canal  mass is demonstrated. Unremarkable appearance of the seventh and eighth cranial nerves bilaterally. There is no acute infarct. No chronic intracranial blood products. No extra-axial fluid collection. No midline shift. Incidentally noted cavum septum pellucidum and cavum vergae. Left M1/M2 MCA aneurysm as described below. Otherwise, no pathologic intracranial enhancement. Vascular: Maintained flow voids within the proximal large arterial vessels. Please see concurrently performed MRA for a description of a left M1/M2 MCA aneurysm. Skull and upper cervical spine: No focal suspicious marrow lesion. Sinuses/Orbits: Visualized orbits show no acute finding. Mild mucosal thickening within the bilateral ethmoid air cells. Mild mucosal thickening within the right maxillary sinus. Moderate mucosal thickening within the left maxillary sinus. Other: Right mastoid effusion. MRA HEAD FINDINGS Anterior circulation: The intracranial internal carotid arteries are patent. Duplicated left middle cerebral artery. The M1 middle cerebral arteries are patent. No M2 proximal branch occlusion or high-grade proximal stenosis is identified. The anterior cerebral arteries are patent. 5 mm inferolaterally projecting aneurysm arising from the supraclinoid left ICA (for instance as seen on series 5, image 79). 9 x 6 mm saccular aneurysm arising from the left middle cerebral artery at the M1/M2 junction (series 5, image 98). Posterior circulation: The intracranial vertebral arteries are patent. Left vertebral artery dominant. The basilar artery is patent. The posterior cerebral arteries are patent. Posterior communicating arteries are hypoplastic or absent bilaterally. Anatomic variants: As described. MRA head impressions #1 and #2 will be called to the ordering clinician or representative by the Radiologist Assistant, and communication documented in the PACS or [employer] IMPRESSION: MRI brain: 1. No evidence of acute intracranial  abnormality. 2. No cerebellopontine angle or internal auditory canal mass. 3. Mild chronic small-vessel ischemic changes within the cerebral white matter. 4. Small chronic lacunar infarcts within the bilateral basal ganglia. 5. 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 6. Mild generalized cerebral atrophy. 7. Paranasal sinus disease, as described. 8. Right mastoid effusion. MRA head: 1. Duplicated left middle cerebral artery. There is a 9 x 6 mm saccular aneurysm arising from the left middle cerebral artery at the M1/M2 junction. Neuro-interventional consultation is recommended. 2. 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 Neuro-interventional consultation is recommended. 3. No intracranial large vessel occlusion or proximal high-grade arterial stenosis. Electronically Signed: By: KKellie SimmeringD.O. On: 09/15/2020 09:12   MR BRAIN/IAC W WO CONTRAST  Addendum Date: 09/15/2020   ADDENDUM REPORT: 09/15/2020  10:07 ADDENDUM: MRA impressions 1 and 2 called by telephone on 09/15/2020 at 10:07 am to provider [facility] , who verbally acknowledged these results. Electronically Signed   By: Kellie Simmering D.O.   On: 09/15/2020 10:07   Result Date: 09/15/2020 CLINICAL DATA:  Dizziness. Additional history provided by scanning technologist: Patient reports dizziness when looking up, ringing in both ears, symptoms for years. EXAM: MRI HEAD WITHOUT AND WITH CONTRAST MRA HEAD WITHOUT CONTRAST TECHNIQUE: Multiplanar, multi-echo pulse sequences of the brain and surrounding structures were acquired without and with intravenous contrast. Angiographic images of the Circle of Willis were acquired using MRA technique without intravenous contrast. CONTRAST:  70m GADAVIST GADOBUTROL 1 MMOL/ML IV SOLN COMPARISON:  No pertinent prior exam. FINDINGS: MRI HEAD FINDINGS Brain: Mild generalized cerebral atrophy. Mild multifocal T2/FLAIR hyperintensity within the cerebral  white matter, nonspecific but compatible with chronic small vessel ischemic disease. Small chronic lacunar infarcts within the bilateral basal ganglia. 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 No evidence of an intracranial mass. Specifically, no cerebellopontine angle or internal auditory canal mass is demonstrated. Unremarkable appearance of the seventh and eighth cranial nerves bilaterally. There is no acute infarct. No chronic intracranial blood products. No extra-axial fluid collection. No midline shift. Incidentally noted cavum septum pellucidum and cavum vergae. Left M1/M2 MCA aneurysm as described below. Otherwise, no pathologic intracranial enhancement. Vascular: Maintained flow voids within the proximal large arterial vessels. Please see concurrently performed MRA for a description of a left M1/M2 MCA aneurysm. Skull and upper cervical spine: No focal suspicious marrow lesion. Sinuses/Orbits: Visualized orbits show no acute finding. Mild mucosal thickening within the bilateral ethmoid air cells. Mild mucosal thickening within the right maxillary sinus. Moderate mucosal thickening within the left maxillary sinus. Other: Right mastoid effusion. MRA HEAD FINDINGS Anterior circulation: The intracranial internal carotid arteries are patent. Duplicated left middle cerebral artery. The M1 middle cerebral arteries are patent. No M2 proximal branch occlusion or high-grade proximal stenosis is identified. The anterior cerebral arteries are patent. 5 mm inferolaterally projecting aneurysm arising from the supraclinoid left ICA (for instance as seen on series 5, image 79). 9 x 6 mm saccular aneurysm arising from the left middle cerebral artery at the M1/M2 junction (series 5, image 98). Posterior circulation: The intracranial vertebral arteries are patent. Left vertebral artery dominant. The basilar artery is patent. The posterior cerebral arteries are patent. Posterior communicating  arteries are hypoplastic or absent bilaterally. Anatomic variants: As described. MRA head impressions #1 and #2 will be called to the ordering clinician or representative by the Radiologist Assistant, and communication documented in the PACS or [employer] IMPRESSION: MRI brain: 1. No evidence of acute intracranial abnormality. 2. No cerebellopontine angle or internal auditory canal mass. 3. Mild chronic small-vessel ischemic changes within the cerebral white matter. 4. Small chronic lacunar infarcts within the bilateral basal ganglia. 5. 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 6. Mild generalized cerebral atrophy. 7. Paranasal sinus disease, as described. 8. Right mastoid effusion. MRA head: 1. Duplicated left middle cerebral artery. There is a 9 x 6 mm saccular aneurysm arising from the left middle cerebral artery at the M1/M2 junction. Neuro-interventional consultation is recommended. 2. 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 Neuro-interventional consultation is recommended. 3. No intracranial large vessel occlusion or proximal high-grade arterial stenosis. Electronically Signed: By: KKellie SimmeringD.O. On: 09/15/2020 09:12    Labs:  CBC: Recent Labs    03/29/20 1027 09/22/20 0744  WBC 5.1 4.5  HGB 13.8 12.2*  HCT 39.9 37.7*  PLT 205 237    COAGS: No results for input(s): INR, APTT in the last 8760 hours.  BMP: Recent Labs    03/29/20 1027  NA 138  K 4.2  CL 107  CO2 24  GLUCOSE 105*  BUN 21  CALCIUM 9.3  CREATININE 0.81  GFRNONAA >60    LIVER FUNCTION TESTS: Recent Labs    03/29/20 1027  BILITOT 1.2  AST 23  ALT 19  ALKPHOS 77  PROT 7.4  ALBUMIN 4.1    TUMOR MARKERS: No results for input(s): AFPTM, CEA, CA199, CHROMGRNA in the last 8760 hours.  Assessment and Plan: Patient with past medical history of CAD, lupus, GERD, anxiety presents with complaint of dizziness, recently found to have  2 incidental intra-cranial aneurysms.  Case reviewed by Dr. Karenann Cai who approves patient for procedure and has met with the patient in consultation to discuss.  Patient presents today in their usual state of health.  He has been NPO and is not currently on blood thinners.   Risks and benefits of diagnostic angiogram were discussed with the patient including, but not limited to bleeding, infection, vascular injury or contrast induced renal failure.  This interventional procedure involves the use of X-rays and because of the nature of the planned procedure, it is possible that we will have prolonged use of X-ray fluoroscopy.  Potential radiation risks to you include (but are not limited to) the following: - A slightly elevated risk for cancer  several years later in life. This risk is typically less than 0.5% percent. This risk is low in comparison to the normal incidence of human cancer, which is 33% for women and 50% for men according to the [employer]. - Radiation induced injury can include skin redness, resembling a rash, tissue breakdown / ulcers and hair loss (which can be temporary or permanent).   The likelihood of either of these occurring depends on the difficulty of the procedure and whether you are sensitive to radiation due to previous procedures, disease, or genetic conditions.   IF your procedure requires a prolonged use of radiation, you will be notified and given written instructions for further action.  It is your responsibility to monitor the irradiated area for the 2 weeks following the procedure and to notify your physician if you are concerned that you have suffered a radiation induced injury.    All of the patient's questions were answered, patient is agreeable to proceed.  Consent signed and in chart.  Thank you for this interesting consult.  I greatly enjoyed meeting Azariel Banik. and look forward to participating in their care.  A copy of  this report was sent to the requesting provider on this date.  Electronically Signed: Docia Barrier, PA 09/22/2020, 8:14 AM   I spent a total of  30 Minutes   in face to face in clinical consultation, greater than 50% of which was counseling/coordinating care for intra-cranial aneurysms.

## 2020-09-22 ENCOUNTER — Ambulatory Visit (HOSPITAL_COMMUNITY)
Admission: RE | Admit: 2020-09-22 | Discharge: 2020-09-22 | Disposition: A | Discharge status: Home / Self Care | Payer: Medicare PPO | Source: Ambulatory Visit | Attending: Neuroradiology | Admitting: Neuroradiology

## 2020-09-22 ENCOUNTER — Other Ambulatory Visit (HOSPITAL_COMMUNITY): Payer: Self-pay | Admitting: Neuroradiology

## 2020-09-22 ENCOUNTER — Encounter (HOSPITAL_COMMUNITY): Payer: Self-pay

## 2020-09-22 ENCOUNTER — Other Ambulatory Visit: Payer: Self-pay

## 2020-09-22 DIAGNOSIS — I2510 Atherosclerotic heart disease of native coronary artery without angina pectoris: Secondary | ICD-10-CM | POA: Insufficient documentation

## 2020-09-22 DIAGNOSIS — I671 Cerebral aneurysm, nonruptured: Secondary | ICD-10-CM

## 2020-09-22 DIAGNOSIS — Z79899 Other long term (current) drug therapy: Secondary | ICD-10-CM | POA: Diagnosis not present

## 2020-09-22 DIAGNOSIS — E7800 Pure hypercholesterolemia, unspecified: Secondary | ICD-10-CM | POA: Insufficient documentation

## 2020-09-22 DIAGNOSIS — K2100 Gastro-esophageal reflux disease with esophagitis, without bleeding: Secondary | ICD-10-CM | POA: Diagnosis not present

## 2020-09-22 DIAGNOSIS — Z87891 Personal history of nicotine dependence: Secondary | ICD-10-CM | POA: Diagnosis not present

## 2020-09-22 DIAGNOSIS — L4050 Arthropathic psoriasis, unspecified: Secondary | ICD-10-CM | POA: Insufficient documentation

## 2020-09-22 DIAGNOSIS — I10 Essential (primary) hypertension: Secondary | ICD-10-CM | POA: Diagnosis not present

## 2020-09-22 DIAGNOSIS — Z888 Allergy status to other drugs, medicaments and biological substances status: Secondary | ICD-10-CM | POA: Diagnosis not present

## 2020-09-22 HISTORY — PX: IR ANGIO VERTEBRAL SEL SUBCLAVIAN INNOMINATE UNI L MOD SED: IMG5364

## 2020-09-22 HISTORY — PX: IR 3D INDEPENDENT WKST: IMG2385

## 2020-09-22 HISTORY — PX: IR ANGIO VERTEBRAL SEL VERTEBRAL UNI R MOD SED: IMG5368

## 2020-09-22 HISTORY — PX: IR US GUIDE VASC ACCESS RIGHT: IMG2390

## 2020-09-22 HISTORY — PX: IR ANGIO INTRA EXTRACRAN SEL INTERNAL CAROTID BILAT MOD SED: IMG5363

## 2020-09-22 LAB — CBC
HCT: 37.7 % — ABNORMAL LOW (ref 39.0–52.0)
Hemoglobin: 12.2 g/dL — ABNORMAL LOW (ref 13.0–17.0)
MCH: 29.8 pg (ref 26.0–34.0)
MCHC: 32.4 g/dL (ref 30.0–36.0)
MCV: 92.2 fL (ref 80.0–100.0)
Platelets: 237 10*3/uL (ref 150–400)
RBC: 4.09 MIL/uL — ABNORMAL LOW (ref 4.22–5.81)
RDW: 14 % (ref 11.5–15.5)
WBC: 4.5 10*3/uL (ref 4.0–10.5)
nRBC: 0 % (ref 0.0–0.2)

## 2020-09-22 LAB — BASIC METABOLIC PANEL
Anion gap: 8 (ref 5–15)
BUN: 21 mg/dL (ref 8–23)
CO2: 22 mmol/L (ref 22–32)
Calcium: 9.2 mg/dL (ref 8.9–10.3)
Chloride: 108 mmol/L (ref 98–111)
Creatinine, Ser: 0.96 mg/dL (ref 0.61–1.24)
GFR, Estimated: >60 mL/min (ref >60)
Glucose, Bld: 108 mg/dL — ABNORMAL HIGH (ref 70–99)
Potassium: 4.3 mmol/L (ref 3.5–5.1)
Sodium: 138 mmol/L (ref 135–145)

## 2020-09-22 LAB — PROTIME-INR
INR: 1 (ref 0.8–1.2)
Prothrombin Time: 13.3 seconds (ref 11.4–15.2)

## 2020-09-22 MED ORDER — MIDAZOLAM HCL 2 MG/2ML IJ SOLN
INTRAMUSCULAR | Status: AC
  Filled 2020-09-22: qty 2

## 2020-09-22 MED ORDER — FENTANYL CITRATE (PF) 100 MCG/2ML IJ SOLN
INTRAMUSCULAR | Status: AC
  Filled 2020-09-22: qty 2

## 2020-09-22 MED ORDER — VERAPAMIL HCL 2.5 MG/ML IV SOLN
INTRAVENOUS | Status: AC
  Administered 2020-09-22: 2.5 mg via INTRA_ARTERIAL
  Filled 2020-09-22: qty 2

## 2020-09-22 MED ORDER — MIDAZOLAM HCL 2 MG/2ML IJ SOLN
INTRAMUSCULAR | Status: DC | PRN
  Administered 2020-09-22: .5 mg via INTRAVENOUS
  Administered 2020-09-22: 1 mg via INTRAVENOUS
  Administered 2020-09-22: .5 mg via INTRAVENOUS

## 2020-09-22 MED ORDER — HEPARIN SODIUM (PORCINE) 1000 UNIT/ML IJ SOLN
INTRAMUSCULAR | Status: AC
  Administered 2020-09-22: 5 mL via INTRA_ARTERIAL
  Filled 2020-09-22: qty 1

## 2020-09-22 MED ORDER — IOHEXOL 350 MG/ML SOLN
100.0000 mL | Freq: Once | INTRAVENOUS | Status: AC | PRN
  Administered 2020-09-22: 24 mL via INTRA_ARTERIAL

## 2020-09-22 MED ORDER — HEPARIN SODIUM (PORCINE) 1000 UNIT/ML IJ SOLN
INTRAMUSCULAR | Status: AC
  Filled 2020-09-22: qty 1

## 2020-09-22 MED ORDER — IOHEXOL 300 MG/ML  SOLN
100.0000 mL | Freq: Once | INTRAMUSCULAR | Status: AC | PRN
  Administered 2020-09-22: 88 mL via INTRAVENOUS

## 2020-09-22 MED ORDER — FENTANYL CITRATE (PF) 100 MCG/2ML IJ SOLN
INTRAMUSCULAR | Status: DC | PRN
  Administered 2020-09-22: 50 ug via INTRAVENOUS
  Administered 2020-09-22 (×2): 25 ug via INTRAVENOUS

## 2020-09-22 MED ORDER — NITROGLYCERIN 1 MG/10 ML FOR IR/CATH LAB
INTRA_ARTERIAL | Status: AC
  Administered 2020-09-22: 400 mL via INTRA_ARTERIAL
  Filled 2020-09-22: qty 10

## 2020-09-22 MED ORDER — SODIUM CHLORIDE 0.9 % IV SOLN: Freq: Once | INTRAVENOUS | Status: AC

## 2020-09-22 MED ORDER — LIDOCAINE HCL 1 % IJ SOLN
INTRAMUSCULAR | Status: AC
  Administered 2020-09-22: 2 mL via INTRA_ARTERIAL
  Filled 2020-09-22: qty 20

## 2020-09-22 MED ORDER — VERAPAMIL HCL 2.5 MG/ML IV SOLN: INTRA_ARTERIAL | Status: DC | PRN

## 2020-09-22 NOTE — Procedures (Signed)
INTERVENTIONAL NEURORADIOLOGY BRIEF POSTPROCEDURE NOTE   Diagnostic cerebral angiogram    Attending: Dr. Baldemar Lenis   Assistant: None.    Diagnosis: Left ICA and left MCA aneurysms    Access site: Right radial artery    Access closure: TR band    Anesthesia: Moderate sedation    Medication used: 2 mg Versed IV; 100 mcg Fentanyl IV.   Complications: None    Estimated blood loss: Negligible    Specimen: None    Findings: There is a 5 mm narrow neck aneurysm of the left ICA at the origin of the posterior communicating artery.  An 8 mm wide neck aneurysm of the left MCA bifurcation.  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  No other aneurysm identified.    The patient tolerated the procedure well without incident or complication and is in stable condition.   Please see detailed report on canopy PACS.

## 2020-09-23 ENCOUNTER — Other Ambulatory Visit (HOSPITAL_COMMUNITY): Payer: Self-pay | Admitting: Neuroradiology

## 2020-09-23 ENCOUNTER — Other Ambulatory Visit: Payer: Self-pay | Admitting: Student

## 2020-09-23 DIAGNOSIS — I6529 Occlusion and stenosis of unspecified carotid artery: Secondary | ICD-10-CM

## 2020-09-23 DIAGNOSIS — I671 Cerebral aneurysm, nonruptured: Secondary | ICD-10-CM

## 2020-09-23 MED ORDER — TICAGRELOR 90 MG PO TABS: 90.0000 mg | ORAL_TABLET | Freq: Two times a day (BID) | ORAL | 1 refills | Status: DC

## 2020-09-23 MED ORDER — TICAGRELOR 60 MG PO TABS: 90.0000 mg | ORAL_TABLET | Freq: Two times a day (BID) | ORAL | Status: DC

## 2020-09-30 ENCOUNTER — Other Ambulatory Visit: Payer: Self-pay | Admitting: Student

## 2020-09-30 ENCOUNTER — Encounter (HOSPITAL_COMMUNITY): Payer: Self-pay | Admitting: *Deleted

## 2020-09-30 ENCOUNTER — Other Ambulatory Visit (HOSPITAL_COMMUNITY): Payer: Self-pay | Admitting: Physician Assistant

## 2020-09-30 NOTE — Progress Notes (Signed)
DUE TO COVID-19 ONLY ONE VISITOR IS ALLOWED TO COME WITH YOU AND STAY IN THE WAITING ROOM ONLY DURING PRE OP AND PROCEDURE DAY OF SURGERY.   Two  VISITORS  MAY VISIT WITH YOU AFTER SURGERY IN YOUR PRIVATE ROOM DURING VISITING HOURS ONLY!   PCP - Dr Marisue Ivan Cardiologist - Dr Harold Hedge  CT Chest x-ray - 07/07/20 EKG - 06/16/20 CE (Req.from [facility]), 03/30/20 Epic Stress Test - 11/19/19 CE ECHO - 06/28/20 Cardiac Cath - n/a  ICD Pacemaker/Loop - n/a  Sleep Study -  n/a CPAP - none  Aspirin/Blood Thinner Instructions: Follow your surgeon's instructions on when to stop aspirin and brilinta prior to surgery,  If no instructions were given by your surgeon then you will need to call the office for those instructions. Pt states he was given instructions by MD.  Anesthesia review: Yes  STOP now taking any Aspirin (unless otherwise instructed by your surgeon), Aleve, Naproxen, Ibuprofen, Motrin, Advil, Goody's, BC's, all herbal medications, fish oil, and all vitamins.   Coronavirus Screening Covid test is scheduled on 09/30/20 Do you have any of the following symptoms:  Cough yes/no: No Fever (>100.27F)  yes/no: No Runny nose yes/no: No Sore throat yes/no: No Difficulty breathing/shortness of breath  yes/no: No  Have you traveled in the last 14 days and where? yes/no: No  Patient verbalized understanding of instructions that were given via phone.

## 2020-09-30 NOTE — Anesthesia Preprocedure Evaluation (Addendum)
Anesthesia Evaluation  Patient identified by MRN, date of birth, ID band Patient awake    Reviewed: Allergy & Precautions, NPO status , Patient's Chart, lab work & pertinent test results  History of Anesthesia Complications Negative for: history of anesthetic complications  Airway Mallampati: III  TM Distance: >3 FB Neck ROM: Full    Dental  (+) Dental Advisory Given, Teeth Intact   Pulmonary COPD,  COPD inhaler, former smoker,    Pulmonary exam normal        Cardiovascular hypertension, Pt. on medications + CAD  Normal cardiovascular exam+ dysrhythmias      Neuro/Psych  Headaches, PSYCHIATRIC DISORDERS Anxiety  Intracerebral aneurysm     GI/Hepatic Neg liver ROS, GERD  Medicated and Controlled,  Endo/Other  negative endocrine ROS  Renal/GU negative Renal ROS     Musculoskeletal  (+) Arthritis ,   Abdominal   Peds  Hematology  (+) anemia ,  On brilinta    Anesthesia Other Findings Lupus   Reproductive/Obstetrics                          Anesthesia Physical Anesthesia Plan  ASA: 3  Anesthesia Plan: General   Post-op Pain Management:    Induction: Intravenous  PONV Risk Score and Plan: 2 and Treatment may vary due to age or medical condition, Ondansetron and Propofol infusion  Airway Management Planned: Oral ETT  Additional Equipment: Arterial line  Intra-op Plan:   Post-operative Plan: Extubation in OR  Informed Consent: I have reviewed the patients History and Physical, chart, labs and discussed the procedure including the risks, benefits and alternatives for the proposed anesthesia with the patient or authorized representative who has indicated his/her understanding and acceptance.     Dental advisory given and Consent reviewed with POA  Plan Discussed with: CRNA and Anesthesiologist  Anesthesia Plan Comments: (May begin procedure as MAC with conversion to GA as  indicated by procedure )    Anesthesia Quick Evaluation

## 2020-09-30 NOTE — Progress Notes (Signed)
Anesthesia Chart Review:  Case: [id number] Date/Time: 10/03/20 0830   Procedure: EMBOLIZATION WITH ANESTHESIA   Anesthesia type: General   Pre-op diagnosis: BRAIN ANEURYSM   Location: [facility] RAD[facility]OLOG[facility] / [facility]   Surgeons: de Glori Luis, MD       DISCUSSION: Patient is a 65 year old male scheduled for the above procedure. 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   History includes former smoker, HTN, HLD, discoid lupus, CAD (by cardiology notes, "non-critical" CAD by previous cath), psoriasis, COPD, anxiety. + COVID-19 07/22/20.  Last cardiology visit with Dr. Lady Gary ([facility]) on 07/11/20. He wrote, "Carotid Doppler showed 50 to 69% stenosis in the internal carotid on the right left insignificant. Will refer to vascular surgery for further work-up and treatment recommendations. Echo showed an EF 35% which is similar to what was noted on his functional study in the past. He recently noted some fatigue and heaviness and therefore underwent a functional study. This showed good exercise tolerance with no arrhythmia or ischemia. His symptoms have stabilized. He denies orthopnea or PND. He attends to be as active as possible." He notes patient has a history of non-critical CAD and function study done in July showed "no ischemia or arrhythmia". He has know LBBB. He was not in HF at that time.    He had pulmonology consultation with Dr. Karna Christmas on 09/01/20 for DOE and abnormal 07/2020 chest.CT showing patching basilar opacities. "FEV1-2.55L-65%". Breathing improved to baseline post antibiotic therapy.    He is a same day work-up, anesthesia team to evaluate on the day of the procedure.  HE is on Brilinta and ASA for procedure.   VS: Ht 6\' 1"  (1.854 m)   Wt 88 kg   BMI 25.60 kg/m  BP Readings from Last 3 Encounters:  09/22/20 (!) 145/76  08/25/20 (!) 156/83  03/29/20 (!) 189/96   Pulse Readings from Last 3 Encounters:  09/22/20 (!) 58  08/25/20 88   03/29/20 65      PROVIDERS: 03/31/20, MD is PCP Marisue Ivan, [facility] Care Everywhere) [facility], MD is pulmonologist  Vida Rigger, [facility] Care Everywhere) [facility], MD is rheumatologist  Cecille Amsterdam, [facility] Care Everywhere) [facility], MD is cardiologist Harold Hedge, [facility] Care Everywhere) [facility], MD is vascular surgeon. Evaluation 08/25/20. Per note, "Duplex ultrasound of the carotid arteries shows <50%  bilateral carotid stenosis." One year follow-up planned.   LABS: Currently, last lab results include: Lab Results  Component Value Date   WBC 4.5 09/22/2020   HGB 12.2 (L) 09/22/2020   HCT 37.7 (L) 09/22/2020   PLT 237 09/22/2020   GLUCOSE 108 (H) 09/22/2020   ALT 19 03/29/2020   AST 23 03/29/2020   NA 138 09/22/2020   K 4.3 09/22/2020   CL 108 09/22/2020   CREATININE 0.96 09/22/2020   BUN 21 09/22/2020   CO2 22 09/22/2020   INR 1.0 09/22/2020     IMAGES: Diagnostic Cerebral Angiogram 09/22/20: IMPRESSION: 1. 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 2. 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 3. Luminal irregularity of the bilateral carotid siphons suggestive of intracranial atherosclerotic disease without hemodynamically significant stenosis. 4. Calcified atherosclerotic changes of the bilateral carotid bifurcation without hemodynamically significant stenosis. PLAN: Patient will be brought for office consultation to discuss brain aneurysms management.   MRI Brain/MRA Head 09/14/20: IMPRESSION: MRI brain: 1. No evidence of acute intracranial  abnormality. 2. No cerebellopontine angle or internal auditory canal mass. 3. Mild chronic small-vessel ischemic changes within the cerebral white matter. 4. Small chronic lacunar infarcts within the bilateral basal ganglia. 5. 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 6. Mild generalized cerebral  atrophy. 7. Paranasal sinus disease, as described. 8. Right mastoid effusion.   MRA head: 1. Duplicated left middle cerebral artery. There is a 9 x 6 mm saccular aneurysm arising from the left middle cerebral artery at the M1/M2 junction. Neuro-interventional consultation is recommended. 2. 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 Neuro-interventional consultation is recommended. 3. No intracranial large vessel occlusion or proximal high-grade arterial stenosis.  CT Chest 07/07/20: IMPRESSION: - Lung-RADS 2, benign appearance or behavior. Continue annual screening with low-dose chest CT without contrast in 12 months. - Mild patchy opacities in the bilateral lung bases, new, suggesting mild infection/pneumonia versus post infectious/inflammatory scarring. - Aortic Atherosclerosis (ICD10-I70.0) and Emphysema (ICD10-J43.9).  [place] 03/07/20: IMPRESSION: No evidence of abdominal aortic aneurysm.    EKG:  EKG 06/16/20 [facility]): Per result narrative: Sinus bradycardia  Left bundle branch block  Abnormal ECG  When compared with ECG of 16-Jun-2020 14:29,  No significant change was found  I reviewed and concur with this report. Electronically signed QP:[id number] MD, Trisha Mangle [phone]) on 07/05/2020 2:39:07 PM   EKG 03/29/20: Normal sinus rhythm Rightward axis Left bundle branch block Abnormal ECG  CV: US Carotid 06/28/20 ([facility]): Per 07/11/20 note by Dr. Lady Gary, "Carotid Doppler showed 50 to 69% stenosis in the internal carotid on the right left insignificant."   Echo 06/28/20 ([facility]): INTERPRETATION  MODERATE LV SYSTOLIC DYSFUNCTION (See above)  NORMAL RIGHT VENTRICULAR SYSTOLIC FUNCTION  NO VALVULAR STENOSIS  MILD MR, TR  EF 30%  Closest EF: 35% (Estimated)  Contraction: MOD GLOBAL DECREASE  LVH: MILD LVH  Mitral: MILD MR  Tricuspid: MILD TR    Nuclear stress test 11/19/19 ([facility]): LVEF= 35%   FINDINGS:  Regional wall motion:  reveals  normal myocardial thickening and wall  motion.  The overall quality of the study is good.    Artifacts noted: no  Left ventricular cavity: normal.   Perfusion Analysis:  SPECT images demonstrate small perfusion abnormality  of mild intensity is present in the inferoseptal region on the stress  images. Defect type:  Fixed  IMPRESSION: ETT based on baseline changes.  EF read as 35% with left bundle branch  block.  No reversible ischemia.  Somewhat equivocal study due to bundle  branch block.  Clinical correlation indicated. - Per 11/30/19 office note by Dr. Lady Gary in [place] [facility], "He recently noted some fatigue and heaviness and therefore underwent a functional study. This showed good exercise tolerance with no arrhythmia or ischemia. His symptoms have stabilized."   48 Hour Holter monitor 04/27/16 ([facility]): Summary:  1.  Sinus rhythm with rare PVCs.  There were no sustained ventricular or supraventricular arrhythmias.  No pauses.      Past Medical History:  Diagnosis Date   Anxiety    Arthritis    Carotid artery disease (HCC)    COPD (chronic obstructive pulmonary disease) (HCC)    Coronary artery disease    ED (erectile dysfunction)    GERD (gastroesophageal reflux disease)    Headache    hx 20 yrs ago per pt   HLD (hyperlipidemia)    Hypertension    Left bundle branch block (LBBB)    Lupus (HCC)    Psoriasis  Past Surgical History:  Procedure Laterality Date   CARDIAC CATHETERIZATION     IR 3D INDEPENDENT WKST  09/22/2020   IR ANGIO INTRA EXTRACRAN SEL INTERNAL CAROTID BILAT MOD SED  09/22/2020   IR ANGIO VERTEBRAL SEL SUBCLAVIAN INNOMINATE UNI L MOD SED  09/22/2020   IR ANGIO VERTEBRAL SEL VERTEBRAL UNI R MOD SED  09/22/2020   IR US GUIDE VASC ACCESS RIGHT  09/22/2020    MEDICATIONS: No current facility-administered medications for this encounter.    aspirin EC 81 MG tablet   betamethasone dipropionate 0.05 % cream   calcipotriene (DOVONOX) 0.005 % cream    Cyanocobalamin (B-12) 1000 MCG TABS   esomeprazole (NEXIUM) 20 MG capsule   Fluticasone-Umeclidin-Vilant (TRELEGY ELLIPTA) 100-62.5-25 MCG/INH AEPB   ibuprofen (ADVIL) 200 MG tablet   losartan (COZAAR) 100 MG tablet   naproxen (NAPROSYN) 500 MG tablet   PARoxetine (PAXIL) 10 MG tablet   rosuvastatin (CRESTOR) 20 MG tablet   ticagrelor (BRILINTA) 90 MG TABS tablet   triamcinolone cream (KENALOG) 0.1 %    Shonna Chock, PA-C Surgical Short Stay/Anesthesiology [facility] Phone [phone] [facility] Phone [phone] 09/30/2020 5:40 PM

## 2020-10-03 ENCOUNTER — Ambulatory Visit (HOSPITAL_COMMUNITY)
Admission: RE | Admit: 2020-10-03 | Discharge: 2020-10-03 | Disposition: A | Discharge status: Home / Self Care | Payer: Medicare PPO | Source: Ambulatory Visit | Attending: Neuroradiology | Admitting: Neuroradiology

## 2020-10-03 ENCOUNTER — Encounter (HOSPITAL_COMMUNITY)
Admission: RE | Disposition: A | Discharge status: Home Health | Payer: Self-pay | Source: Home / Self Care | Attending: Neuroradiology

## 2020-10-03 ENCOUNTER — Ambulatory Visit (HOSPITAL_COMMUNITY): Payer: Medicare PPO | Admitting: Vascular Surgery

## 2020-10-03 ENCOUNTER — Encounter (HOSPITAL_COMMUNITY): Payer: Self-pay

## 2020-10-03 ENCOUNTER — Inpatient Hospital Stay (HOSPITAL_COMMUNITY)
Admission: RE | Admit: 2020-10-03 | Discharge: 2020-10-05 | DRG: 027 | Disposition: A | Discharge status: Home Health | Payer: Medicare PPO | Attending: Neuroradiology | Admitting: Neuroradiology

## 2020-10-03 ENCOUNTER — Other Ambulatory Visit: Payer: Self-pay

## 2020-10-03 DIAGNOSIS — I671 Cerebral aneurysm, nonruptured: Principal | ICD-10-CM | POA: Diagnosis present

## 2020-10-03 DIAGNOSIS — D649 Anemia, unspecified: Secondary | ICD-10-CM | POA: Diagnosis present

## 2020-10-03 DIAGNOSIS — J449 Chronic obstructive pulmonary disease, unspecified: Secondary | ICD-10-CM | POA: Diagnosis present

## 2020-10-03 DIAGNOSIS — Z79899 Other long term (current) drug therapy: Secondary | ICD-10-CM

## 2020-10-03 DIAGNOSIS — I6523 Occlusion and stenosis of bilateral carotid arteries: Secondary | ICD-10-CM | POA: Diagnosis present

## 2020-10-03 DIAGNOSIS — I2510 Atherosclerotic heart disease of native coronary artery without angina pectoris: Secondary | ICD-10-CM | POA: Diagnosis present

## 2020-10-03 DIAGNOSIS — L4050 Arthropathic psoriasis, unspecified: Secondary | ICD-10-CM | POA: Diagnosis present

## 2020-10-03 DIAGNOSIS — L930 Discoid lupus erythematosus: Secondary | ICD-10-CM | POA: Diagnosis present

## 2020-10-03 DIAGNOSIS — Z792 Long term (current) use of antibiotics: Secondary | ICD-10-CM | POA: Diagnosis not present

## 2020-10-03 DIAGNOSIS — I447 Left bundle-branch block, unspecified: Secondary | ICD-10-CM | POA: Diagnosis present

## 2020-10-03 DIAGNOSIS — K2100 Gastro-esophageal reflux disease with esophagitis, without bleeding: Secondary | ICD-10-CM | POA: Diagnosis present

## 2020-10-03 DIAGNOSIS — I10 Essential (primary) hypertension: Secondary | ICD-10-CM | POA: Diagnosis present

## 2020-10-03 DIAGNOSIS — F411 Generalized anxiety disorder: Secondary | ICD-10-CM | POA: Diagnosis present

## 2020-10-03 DIAGNOSIS — Z7982 Long term (current) use of aspirin: Secondary | ICD-10-CM | POA: Diagnosis not present

## 2020-10-03 DIAGNOSIS — E7800 Pure hypercholesterolemia, unspecified: Secondary | ICD-10-CM | POA: Diagnosis present

## 2020-10-03 DIAGNOSIS — Z87891 Personal history of nicotine dependence: Secondary | ICD-10-CM

## 2020-10-03 DIAGNOSIS — Z8616 Personal history of COVID-19: Secondary | ICD-10-CM | POA: Diagnosis not present

## 2020-10-03 HISTORY — PX: IR ANGIO INTRA EXTRACRAN SEL INTERNAL CAROTID UNI L MOD SED: IMG5361

## 2020-10-03 HISTORY — DX: Systemic lupus erythematosus, unspecified: M32.9

## 2020-10-03 HISTORY — PX: IR ANGIOGRAM FOLLOW UP STUDY: IMG697

## 2020-10-03 HISTORY — PX: IR US GUIDE VASC ACCESS RIGHT: IMG2390

## 2020-10-03 HISTORY — DX: Left bundle-branch block, unspecified: I44.7

## 2020-10-03 HISTORY — DX: Psoriasis, unspecified: L40.9

## 2020-10-03 HISTORY — DX: Disorder of arteries and arterioles, unspecified: I77.9

## 2020-10-03 HISTORY — PX: IR CT HEAD LTD: IMG2386

## 2020-10-03 HISTORY — DX: Anxiety disorder, unspecified: F41.9

## 2020-10-03 HISTORY — DX: Male erectile dysfunction, unspecified: N52.9

## 2020-10-03 HISTORY — PX: IR TRANSCATH/EMBOLIZ: IMG695

## 2020-10-03 HISTORY — DX: Hyperlipidemia, unspecified: E78.5

## 2020-10-03 HISTORY — DX: Atherosclerotic heart disease of native coronary artery without angina pectoris: I25.10

## 2020-10-03 HISTORY — DX: Unspecified osteoarthritis, unspecified site: M19.90

## 2020-10-03 HISTORY — DX: Headache, unspecified: R51.9

## 2020-10-03 HISTORY — PX: RADIOLOGY WITH ANESTHESIA: SHX6223

## 2020-10-03 HISTORY — DX: Chronic obstructive pulmonary disease, unspecified: J44.9

## 2020-10-03 HISTORY — DX: Gastro-esophageal reflux disease without esophagitis: K21.9

## 2020-10-03 LAB — BASIC METABOLIC PANEL
Anion gap: 11 (ref 5–15)
BUN: 17 mg/dL (ref 8–23)
CO2: 21 mmol/L — ABNORMAL LOW (ref 22–32)
Calcium: 9.5 mg/dL (ref 8.9–10.3)
Chloride: 106 mmol/L (ref 98–111)
Creatinine, Ser: 1 mg/dL (ref 0.61–1.24)
GFR, Estimated: >60 mL/min (ref >60)
Glucose, Bld: 107 mg/dL — ABNORMAL HIGH (ref 70–99)
Potassium: 4 mmol/L (ref 3.5–5.1)
Sodium: 138 mmol/L (ref 135–145)

## 2020-10-03 LAB — CBC
HCT: 43.6 % (ref 39.0–52.0)
Hemoglobin: 14.6 g/dL (ref 13.0–17.0)
MCH: 30.2 pg (ref 26.0–34.0)
MCHC: 33.5 g/dL (ref 30.0–36.0)
MCV: 90.3 fL (ref 80.0–100.0)
Platelets: 250 10*3/uL (ref 150–400)
RBC: 4.83 MIL/uL (ref 4.22–5.81)
RDW: 13.6 % (ref 11.5–15.5)
WBC: 5.2 10*3/uL (ref 4.0–10.5)
nRBC: 0 % (ref 0.0–0.2)

## 2020-10-03 LAB — POCT ACTIVATED CLOTTING TIME
Activated Clotting Time: 138 seconds
Activated Clotting Time: 190 seconds
Activated Clotting Time: 190 seconds
Activated Clotting Time: 225 seconds
Activated Clotting Time: 219 seconds
Activated Clotting Time: 202 seconds

## 2020-10-03 LAB — PROTIME-INR
INR: 0.9 (ref 0.8–1.2)
Prothrombin Time: 12.4 seconds (ref 11.4–15.2)

## 2020-10-03 LAB — MRSA NEXT GEN BY PCR, NASAL: MRSA by PCR Next Gen: NOT DETECTED

## 2020-10-03 SURGERY — IR WITH ANESTHESIA
Anesthesia: General

## 2020-10-03 MED ORDER — TICAGRELOR 90 MG PO TABS
90.0000 mg | ORAL_TABLET | Freq: Two times a day (BID) | ORAL | Status: DC
  Filled 2020-10-03 (×3): qty 1

## 2020-10-03 MED ORDER — LIDOCAINE HCL 1 % IJ SOLN
INTRAMUSCULAR | Status: AC
  Filled 2020-10-03: qty 20

## 2020-10-03 MED ORDER — ONDANSETRON HCL 4 MG/2ML IJ SOLN
INTRAMUSCULAR | Status: DC | PRN
  Administered 2020-10-03 (×2): 4 mg via INTRAVENOUS

## 2020-10-03 MED ORDER — LIDOCAINE 2% (20 MG/ML) 5 ML SYRINGE
INTRAMUSCULAR | Status: DC | PRN
  Administered 2020-10-03: 60 mg via INTRAVENOUS

## 2020-10-03 MED ORDER — ACETAMINOPHEN 325 MG PO TABS
650.0000 mg | ORAL_TABLET | ORAL | Status: DC | PRN
  Administered 2020-10-03 – 2020-10-04 (×4): 650 mg via ORAL
  Administered 2020-10-04: 325 mg via ORAL
  Administered 2020-10-04 – 2020-10-05 (×4): 650 mg via ORAL
  Filled 2020-10-03 (×9): qty 2

## 2020-10-03 MED ORDER — ASPIRIN 81 MG PO CHEW
81.0000 mg | CHEWABLE_TABLET | Freq: Every day | ORAL | Status: DC
  Administered 2020-10-04 – 2020-10-05 (×2): 81 mg via ORAL
  Filled 2020-10-03 (×2): qty 1

## 2020-10-03 MED ORDER — CLEVIDIPINE BUTYRATE 0.5 MG/ML IV EMUL
0.0000 mg/h | INTRAVENOUS | Status: DC
  Administered 2020-10-03: 8 mg/h via INTRAVENOUS
  Administered 2020-10-03 (×2): 14 mg/h via INTRAVENOUS
  Administered 2020-10-03: 8 mg/h via INTRAVENOUS
  Filled 2020-10-03: qty 100
  Filled 2020-10-03: qty 50

## 2020-10-03 MED ORDER — FENTANYL CITRATE (PF) 100 MCG/2ML IJ SOLN: 25.0000 ug | INTRAMUSCULAR | Status: DC | PRN

## 2020-10-03 MED ORDER — NITROGLYCERIN 1 MG/10 ML FOR IR/CATH LAB
INTRA_ARTERIAL | Status: AC
  Filled 2020-10-03: qty 10

## 2020-10-03 MED ORDER — IOHEXOL 350 MG/ML SOLN
100.0000 mL | Freq: Once | INTRAVENOUS | Status: AC | PRN
  Administered 2020-10-03: 50 mL via INTRA_ARTERIAL

## 2020-10-03 MED ORDER — ONDANSETRON HCL 4 MG/2ML IJ SOLN: 4.0000 mg | Freq: Once | INTRAMUSCULAR | Status: DC | PRN

## 2020-10-03 MED ORDER — ORAL CARE MOUTH RINSE: 15.0000 mL | Freq: Once | OROMUCOSAL | Status: AC

## 2020-10-03 MED ORDER — IOHEXOL 350 MG/ML SOLN
100.0000 mL | Freq: Once | INTRAVENOUS | Status: AC | PRN
  Administered 2020-10-03: 100 mL via INTRA_ARTERIAL

## 2020-10-03 MED ORDER — PROPOFOL 500 MG/50ML IV EMUL
INTRAVENOUS | Status: DC | PRN
  Administered 2020-10-03: 25 ug/kg/min via INTRAVENOUS

## 2020-10-03 MED ORDER — LACTATED RINGERS IV SOLN: INTRAVENOUS | Status: DC

## 2020-10-03 MED ORDER — FENTANYL CITRATE (PF) 250 MCG/5ML IJ SOLN
INTRAMUSCULAR | Status: DC | PRN
  Administered 2020-10-03: 50 ug via INTRAVENOUS

## 2020-10-03 MED ORDER — CHLORHEXIDINE GLUCONATE 0.12 % MT SOLN
OROMUCOSAL | Status: AC
  Administered 2020-10-03: 15 mL via OROMUCOSAL
  Filled 2020-10-03: qty 15

## 2020-10-03 MED ORDER — TICAGRELOR 90 MG PO TABS
90.0000 mg | ORAL_TABLET | Freq: Two times a day (BID) | ORAL | Status: DC
  Administered 2020-10-03 – 2020-10-05 (×4): 90 mg via ORAL
  Filled 2020-10-03 (×5): qty 1

## 2020-10-03 MED ORDER — ASPIRIN 81 MG PO CHEW: 81.0000 mg | CHEWABLE_TABLET | Freq: Every day | ORAL | Status: DC

## 2020-10-03 MED ORDER — CHLORHEXIDINE GLUCONATE CLOTH 2 % EX PADS: 6.0000 | MEDICATED_PAD | Freq: Every day | CUTANEOUS | Status: DC

## 2020-10-03 MED ORDER — PROPOFOL 10 MG/ML IV BOLUS
INTRAVENOUS | Status: DC | PRN
Start: 2020-10-03 — End: 2020-10-03
  Administered 2020-10-03: 120 mg via INTRAVENOUS

## 2020-10-03 MED ORDER — ONDANSETRON HCL 4 MG/2ML IJ SOLN: 4.0000 mg | Freq: Four times a day (QID) | INTRAMUSCULAR | Status: DC | PRN

## 2020-10-03 MED ORDER — PHENYLEPHRINE 40 MCG/ML (10ML) SYRINGE FOR IV PUSH (FOR BLOOD PRESSURE SUPPORT)
PREFILLED_SYRINGE | INTRAVENOUS | Status: DC | PRN
  Administered 2020-10-03 (×2): 80 ug via INTRAVENOUS

## 2020-10-03 MED ORDER — CLEVIDIPINE BUTYRATE 0.5 MG/ML IV EMUL
INTRAVENOUS | Status: DC | PRN
  Administered 2020-10-03: 6 mg/h via INTRAVENOUS

## 2020-10-03 MED ORDER — PHENYLEPHRINE HCL-NACL 20-0.9 MG/250ML-% IV SOLN
INTRAVENOUS | Status: DC | PRN
  Administered 2020-10-03: 30 ug/min via INTRAVENOUS

## 2020-10-03 MED ORDER — VERAPAMIL HCL 2.5 MG/ML IV SOLN
INTRAVENOUS | Status: AC
  Filled 2020-10-03: qty 2

## 2020-10-03 MED ORDER — ROCURONIUM BROMIDE 10 MG/ML (PF) SYRINGE
PREFILLED_SYRINGE | INTRAVENOUS | Status: DC | PRN
  Administered 2020-10-03 (×2): 20 mg via INTRAVENOUS
  Administered 2020-10-03: 60 mg via INTRAVENOUS
  Administered 2020-10-03: 20 mg via INTRAVENOUS

## 2020-10-03 MED ORDER — CLEVIDIPINE BUTYRATE 0.5 MG/ML IV EMUL
INTRAVENOUS | Status: AC
  Filled 2020-10-03: qty 50

## 2020-10-03 MED ORDER — CHLORHEXIDINE GLUCONATE 0.12 % MT SOLN: 15.0000 mL | Freq: Once | OROMUCOSAL | Status: AC

## 2020-10-03 MED ORDER — HEPARIN SODIUM (PORCINE) 1000 UNIT/ML IJ SOLN
INTRAMUSCULAR | Status: DC | PRN
  Administered 2020-10-03: 1000 [IU] via INTRAVENOUS
  Administered 2020-10-03: 2000 [IU] via INTRAVENOUS

## 2020-10-03 MED ORDER — HEPARIN SODIUM (PORCINE) 1000 UNIT/ML IJ SOLN
INTRAMUSCULAR | Status: AC
  Filled 2020-10-03: qty 1

## 2020-10-03 MED ORDER — SODIUM CHLORIDE 0.9 % IV SOLN: INTRAVENOUS | Status: DC

## 2020-10-03 MED ORDER — OXYCODONE HCL 5 MG/5ML PO SOLN: 5.0000 mg | Freq: Once | ORAL | Status: DC | PRN

## 2020-10-03 MED ORDER — ACETAMINOPHEN 160 MG/5ML PO SOLN: 650.0000 mg | ORAL | Status: DC | PRN

## 2020-10-03 MED ORDER — OXYCODONE HCL 5 MG PO TABS: 5.0000 mg | ORAL_TABLET | Freq: Once | ORAL | Status: DC | PRN

## 2020-10-03 MED ORDER — SUGAMMADEX SODIUM 200 MG/2ML IV SOLN
INTRAVENOUS | Status: DC | PRN
  Administered 2020-10-03: 200 mg via INTRAVENOUS

## 2020-10-03 MED ORDER — ACETAMINOPHEN 650 MG RE SUPP: 650.0000 mg | RECTAL | Status: DC | PRN

## 2020-10-03 MED ORDER — ACETAMINOPHEN 325 MG PO TABS
ORAL_TABLET | ORAL | Status: AC
  Filled 2020-10-03: qty 2

## 2020-10-03 NOTE — H&P (Deleted)
  The note originally documented on this encounter has been moved the the encounter in which it belongs.  

## 2020-10-03 NOTE — Anesthesia Postprocedure Evaluation (Signed)
Anesthesia Post Note  Patient: Timothy Reeves.  Procedure(s) Performed: EMBOLIZATION WITH ANESTHESIA     Patient location during evaluation: PACU Anesthesia Type: General Level of consciousness: awake and alert Pain management: pain level controlled Vital Signs Assessment: post-procedure vital signs reviewed and stable Respiratory status: spontaneous breathing, nonlabored ventilation and respiratory function stable Cardiovascular status: blood pressure returned to baseline and stable Postop Assessment: no apparent nausea or vomiting Anesthetic complications: no   No notable events documented.  Last Vitals:  Vitals:   10/03/20 1305 10/03/20 1306  BP:    Pulse: (!) 57 (!) 59  Resp: 20 18  Temp:    SpO2: 95% 96%    Last Pain:  Vitals:   10/03/20 1225  TempSrc:   PainSc: 2                  Beryle Lathe

## 2020-10-03 NOTE — Progress Notes (Signed)
TR band:   1430: arrival to 4N ICU 3 cc, no complications 1445: 0 cc, no complications 1515: band removed, no complications

## 2020-10-03 NOTE — Progress Notes (Signed)
Patient arrived to 4N ICU from PACU at 1430 without complications. VSS. Shoes, socks, shirt, and pants were only belongings at bedside.   Sherral Hammers , RN

## 2020-10-03 NOTE — Progress Notes (Signed)
Verbal order from Dr Quay Burow to go by cuff pressure, SBP goal 120-160, and to discontinue arterial line at this time.   Sherral Hammers RN

## 2020-10-03 NOTE — Procedures (Signed)
INTERVENTIONAL NEURORADIOLOGY BRIEF POSTPROCEDURE NOTE  DIAGNOSTIC CEREBRAL ANGIOGRAM AND ENDOVASCULAR EMBOLIZATION OF LEFT MCA BIFURCATION ANEURYSM AND LEFT ICA ANEURYSM  Attending: Dr. Baldemar Lenis   Assistant: None.  Diagnosis: Left MCA bifurcation aneurysm, left ICA/communicating segment aneurysm  Access site: Right radial artery  Access closure: TR band  Anesthesia: GETA  Medication used: Refer to anesthesia documentation.  Complications: None  Estimated blood loss: Negligible  Specimen: None.  Findings: Left MCA bifurcation aneurysm measuring approximately 6.5 x 5.7 mm and a left ICA/communicating segment aneurysm measuring approximately 4.3 x 2.4 mm. Left MCA bifurcation aneurysm treated with a 7 mm web device and ICA aneurysm treated with 2 overlapping pipeline flow diverters. No evidence of hemorrhagic or thromboembolic complication.  The patient tolerated the procedure well without incident or complication and is in stable condition.

## 2020-10-03 NOTE — Progress Notes (Signed)
Brief NIR progress note:  Patient seen in ICU this afternoon, extubated, on room air, art line in left upper extremity, TR band with 3 cc on right radial access site.  Patient reports 3/10 headache, shivering, no other complaints voiced. Alert, moving all 4 extremities spontaneously.  Plan: - Overnight ICU admission for observation, if stable overnight d/c in AM - Discontinue arterial line, use BP cuff for pressures (goal SBP 120-160 mmHg) - Continue TR bland and deflate per protocol, routine wound care once deflated - NIR will follow up with patient AM of 10/4, please call Dr Tommi Rumps Melchor Amour with questions or concerns overnight  Lynnette Caffey, PA-C

## 2020-10-03 NOTE — Anesthesia Procedure Notes (Signed)
Arterial Line Insertion Start/End10/03/2020 8:53 AM, 10/03/2020 8:57 AM Performed by: Beryle Lathe, MD, anesthesiologist  Patient location: OOR procedure area. Preanesthetic checklist: patient identified, IV checked, risks and benefits discussed, surgical consent, monitors and equipment checked, pre-op evaluation, timeout performed and anesthesia consent Patient sedated Left, radial was placed Catheter size: 20 G Hand hygiene performed   Attempts: 2 (Previous attempts by CRNA unsuccessful) Procedure performed using ultrasound guided technique. Ultrasound Notes:anatomy identified, needle tip was noted to be adjacent to the nerve/plexus identified, no ultrasound evidence of intravascular and/or intraneural injection and image(s) printed for medical record Following insertion, dressing applied and Biopatch. Post procedure assessment: unchanged and normal  Post procedure complications: second provider assisted. Patient tolerated the procedure well with no immediate complications.

## 2020-10-03 NOTE — H&P (Signed)
HPI:  The patient has had a H&P performed within the last 30 days, all history, medications, and exam have been reviewed. The patient denies any interval changes since the H&P.  Timothy Reeves denies any complaints except that his feet are cold from being in the cold hospital. He notes that during the diagnostic angiogram he experienced burning on the left side of his head which lasted a few seconds and then resolved. He understands the procedure today, including the use of general anesthesia and overnight admission to the hospital, and is agreeable to proceed.  Medications: Prior to Admission medications   Medication Sig Start Date End Date Taking? Authorizing Provider  aspirin EC 81 MG tablet Take 81 mg by mouth daily. Swallow whole.   Yes [provider]  betamethasone dipropionate 0.05 % cream Apply topically 2 (two) times daily as needed (Rash). Patient taking differently: Apply 1 application topically 2 (two) times daily as needed (psoriasis). 10/21/19  Yes Deirdre Evener, MD  calcipotriene (DOVONOX) 0.005 % cream Apply to aa's psoriasis QOD alternating with Triamcinolone. Patient taking differently: Apply 1 application topically daily as needed (psoriasis). 11/12/19  Yes Deirdre Evener, MD  Cyanocobalamin (B-12) 1000 MCG TABS Take 1,000 mcg by mouth at bedtime. 07/02/20  Yes [provider]  esomeprazole (NEXIUM) 20 MG capsule Take 20 mg by mouth at bedtime. 08/22/20  Yes [provider]  Fluticasone-Umeclidin-Vilant (TRELEGY ELLIPTA) 100-62.5-25 MCG/INH AEPB Inhale 1 puff into the lungs daily.   Yes [provider]  ibuprofen (ADVIL) 200 MG tablet Take 400 mg by mouth every 6 (six) hours as needed for moderate pain.   Yes [provider]  losartan (COZAAR) 100 MG tablet Take 100 mg by mouth at bedtime. 07/18/20  Yes [provider]  naproxen (NAPROSYN) 500 MG tablet Take 500 mg by mouth 2 (two) times daily as needed for moderate pain.  09/26/15  Yes [provider]  PARoxetine (PAXIL) 10 MG tablet Take 10 mg by mouth at bedtime.   Yes [provider]  rosuvastatin (CRESTOR) 20 MG tablet Take 20 mg by mouth daily. 08/10/20  Yes [provider]  ticagrelor (BRILINTA) 90 MG TABS tablet Take 1 tablet (90 mg total) by mouth 2 (two) times daily. 09/23/20  Yes Hoyt Koch, PA  triamcinolone cream (KENALOG) 0.1 % APPLY TO AFFECTED AREAS (PSORIASIS) TWICE DAILY AS NEEDED FOR FLARES. AVOID FACE, GROIN, AXILLA. Patient taking differently: Apply 1 application topically 2 (two) times daily as needed (psoriasis). 07/20/19  Yes Deirdre Evener, MD     Vital Signs: There were no vitals taken for this visit.  Physical Exam Vitals reviewed.  Constitutional:      General: He is not in acute distress. HENT:     Head: Normocephalic.     Mouth/Throat:     Mouth: Mucous membranes are moist.     Pharynx: Oropharynx is clear. No oropharyngeal exudate or posterior oropharyngeal erythema.  Cardiovascular:     Rate and Rhythm: Normal rate and regular rhythm.  Pulmonary:     Effort: Pulmonary effort is normal.     Breath sounds: Normal breath sounds.  Abdominal:     General: There is no distension.     Palpations: Abdomen is soft.     Tenderness: There is no abdominal tenderness.  Skin:    General: Skin is warm and dry.  Neurological:     Mental Status: He is alert and oriented to person, place, and time.  Cranial Nerves: No cranial nerve deficit.     Motor: No weakness.  Psychiatric:        Mood and Affect: Mood normal.        Behavior: Behavior normal.        Thought Content: Thought content normal.        Judgment: Judgment normal.    Mallampati Score:  MD Evaluation Airway: WNL Heart: WNL Abdomen: WNL Chest/ Lungs: WNL ASA  Classification: Per MD or Designee Mallampati/Airway Score: Two  Labs:  CBC: Recent Labs    03/29/20 1027 09/22/20 0744 10/03/20 0628  WBC 5.1 4.5 5.2   HGB 13.8 12.2* 14.6  HCT 39.9 37.7* 43.6  PLT 205 237 250    COAGS: Recent Labs    09/22/20 0744 10/03/20 0628  INR 1.0 0.9    BMP: Recent Labs    03/29/20 1027 09/22/20 0744 10/03/20 0628  NA 138 138 138  K 4.2 4.3 4.0  CL 107 108 106  CO2 24 22 21*  GLUCOSE 105* 108* 107*  BUN 21 21 17   CALCIUM 9.3 9.2 9.5  CREATININE 0.81 0.96 1.00  GFRNONAA >60 >60 >60    LIVER FUNCTION TESTS: Recent Labs    03/29/20 1027  BILITOT 1.2  AST 23  ALT 19  ALKPHOS 77  PROT 7.4  ALBUMIN 4.1    Assessment/Plan:   65 y/o M with recently diagnosed left narrow neck ICA and left wide neck MCA bifurcation aneurysms who presents today for a cerebral angiogram with possible intervention under general anesthesia.   Patient has been NPO since midnight. Afebrile, WBC 5.2, hgb 14.6, plt 250, creatinine 1.00, INR 0.9.  Risks and benefits of cerebral arteriogram with intervention were discussed with the patient including, but not limited to bleeding, infection, vascular injury, contrast induced renal failure, stroke, reperfusion hemorrhage, or even death.  This interventional procedure involves the use of X-rays and because of the nature of the planned procedure, it is possible that we will have prolonged use of X-ray fluoroscopy. Potential radiation risks to you include (but are not limited to) the following: - A slightly elevated risk for cancer  several years later in life. This risk is typically less than 0.5% percent. This risk is low in comparison to the normal incidence of human cancer, which is 33% for women and 50% for men according to the [employer] Cancer Society. - Radiation induced injury can include skin redness, resembling a rash, tissue breakdown / ulcers and hair loss (which can be temporary or permanent).  The likelihood of either of these occurring depends on the difficulty of the procedure and whether you are sensitive to radiation due to previous procedures, disease, or  genetic conditions.  IF your procedure requires a prolonged use of radiation, you will be notified and given written instructions for further action.  It is your responsibility to monitor the irradiated area for the 2 weeks following the procedure and to notify your physician if you are concerned that you have suffered a radiation induced injury.    All of the patient's questions were answered, patient is agreeable to proceed.  Consent signed and in chart.  Signed: 69 10/03/2020, 8:30 AM   I spent a total of  30 Minutes   in face to face in clinical consultation, greater than 50% of which was counseling/coordinating care for intra-cranial aneurysms.

## 2020-10-03 NOTE — Transfer of Care (Signed)
Immediate Anesthesia Transfer of Care Note  Patient: Timothy Reeves.  Procedure(s) Performed: EMBOLIZATION WITH ANESTHESIA  Patient Location: PACU  Anesthesia Type:General  Level of Consciousness: alert , patient cooperative, confused and responds to stimulation  Airway & Oxygen Therapy: Patient Spontanous Breathing and Patient connected to face mask oxygen  Post-op Assessment: Report given to RN, Post -op Vital signs reviewed and stable and Patient moving all extremities  Post vital signs: Reviewed and stable  Last Vitals:  Vitals Value Taken Time  BP 110/93 10/03/20 1225  Temp    Pulse 69 10/03/20 1236  Resp 22 10/03/20 1236  SpO2 99 % 10/03/20 1236  Vitals shown include unvalidated device data.  Last Pain:  Vitals:   10/03/20 0709  TempSrc:   PainSc: 0-No pain         Complications: No notable events documented.

## 2020-10-03 NOTE — Anesthesia Procedure Notes (Signed)
Procedure Name: Intubation Date/Time: 10/03/2020 8:53 AM Performed by: Myna Bright, CRNA Pre-anesthesia Checklist: Patient identified, Emergency Drugs available, Suction available and Patient being monitored Patient Re-evaluated:Patient Re-evaluated prior to induction Oxygen Delivery Method: Circle system utilized Preoxygenation: Pre-oxygenation with 100% oxygen Induction Type: IV induction Ventilation: Mask ventilation without difficulty Laryngoscope Size: Mac and 4 Grade View: Grade II Tube type: Oral Tube size: 7.5 mm Number of attempts: 1 Airway Equipment and Method: Stylet Placement Confirmation: ETT inserted through vocal cords under direct vision, positive ETCO2 and breath sounds checked- equal and bilateral Secured at: 22 cm Tube secured with: Tape Dental Injury: Teeth and Oropharynx as per pre-operative assessment

## 2020-10-04 ENCOUNTER — Encounter (HOSPITAL_COMMUNITY): Payer: Self-pay | Admitting: Neuroradiology

## 2020-10-04 HISTORY — PX: IR ANGIOGRAM SELECTIVE EACH ADDITIONAL VESSEL: IMG667

## 2020-10-04 HISTORY — PX: IR 3D INDEPENDENT WKST: IMG2385

## 2020-10-04 HISTORY — PX: IR ANGIOGRAM FOLLOW UP STUDY: IMG697

## 2020-10-04 MED ORDER — LOSARTAN POTASSIUM 50 MG PO TABS
100.0000 mg | ORAL_TABLET | Freq: Every day | ORAL | Status: DC
  Administered 2020-10-04 – 2020-10-05 (×2): 100 mg via ORAL
  Filled 2020-10-04 (×2): qty 2

## 2020-10-04 MED ORDER — KETOROLAC TROMETHAMINE 30 MG/ML IJ SOLN
30.0000 mg | Freq: Four times a day (QID) | INTRAMUSCULAR | Status: AC | PRN
  Administered 2020-10-04 (×2): 30 mg via INTRAVENOUS
  Filled 2020-10-04 (×2): qty 1

## 2020-10-04 NOTE — Progress Notes (Signed)
Patient transferred to 4NP03 with all belongings. Family updated.

## 2020-10-04 NOTE — Progress Notes (Signed)
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 Procedure performed 10/03/20 with ICU admission for recovery/overnight observation.   Subjective: Patient awake and alert in bed. His wife and daughter are at the bedside. Patient's complains of a 1/10 headache. He has not been out of bed yet; foley catheter removed but patient has not voided.    Allergies: Atorvastatin  Medications: Prior to Admission medications   Medication Sig Start Date End Date Taking? Authorizing Provider  aspirin EC 81 MG tablet Take 81 mg by mouth daily. Swallow whole.   Yes [provider]  Cyanocobalamin (B-12) 1000 MCG TABS Take 1,000 mcg by mouth at bedtime. 07/02/20  Yes [provider]  esomeprazole (NEXIUM) 20 MG capsule Take 20 mg by mouth at bedtime. 08/22/20  Yes [provider]  Fluticasone-Umeclidin-Vilant (TRELEGY ELLIPTA) 100-62.5-25 MCG/INH AEPB Inhale 1 puff into the lungs daily.   Yes [provider]  ibuprofen (ADVIL) 200 MG tablet Take 400 mg by mouth every 6 (six) hours as needed for moderate pain.   Yes [provider]  losartan (COZAAR) 100 MG tablet Take 100 mg by mouth at bedtime. 07/18/20  Yes [provider]  naproxen (NAPROSYN) 500 MG tablet Take 500 mg by mouth 2 (two) times daily as needed for moderate pain. 09/26/15  Yes [provider]  PARoxetine (PAXIL) 10 MG tablet Take 10 mg by mouth at bedtime.   Yes [provider]  rosuvastatin (CRESTOR) 20 MG tablet Take 20 mg by mouth daily. 08/10/20  Yes [provider]  ticagrelor (BRILINTA) 90 MG TABS tablet Take 1 tablet (90 mg total) by mouth 2 (two) times daily. 09/23/20  Yes Docia Barrier, PA  betamethasone dipropionate 0.05 % cream Apply topically 2  (two) times daily as needed (Rash). Patient taking differently: Apply 1 application topically 2 (two) times daily as needed (psoriasis). 10/21/19   Ralene Bathe, MD  calcipotriene (DOVONOX) 0.005 % cream Apply to aa's psoriasis QOD alternating with Triamcinolone. Patient taking differently: Apply 1 application topically daily as needed (psoriasis). 11/12/19   Ralene Bathe, MD  triamcinolone cream (KENALOG) 0.1 % APPLY TO AFFECTED AREAS (PSORIASIS) TWICE DAILY AS NEEDED FOR FLARES. AVOID FACE, GROIN, AXILLA. Patient taking differently: Apply 1 application topically 2 (two) times daily as needed (psoriasis). 07/20/19   Ralene Bathe, MD     Vital Signs: BP (!) 158/70   Pulse (!) 56   Temp 98.5 F (36.9 C) (Oral)   Resp 12   Ht 6' 1"  (1.854 m)   Wt 194 lb (88 kg)   SpO2 97%   BMI 25.60 kg/m   Physical Exam Constitutional:      General: He is not in acute distress.    Appearance: He is not ill-appearing.  HENT:     Mouth/Throat:     Mouth: Mucous membranes are moist.     Pharynx: Oropharynx is clear.  Cardiovascular:     Rate and Rhythm: Normal rate and regular rhythm.     Comments: Right radial vascular site clean and dry; non-tender. Left a-line site is covered in gauze/tape.  Pulmonary:     Effort: Pulmonary effort is normal.  Skin:    General: Skin is warm and dry.  Neurological:     Mental Status: He is alert and oriented to  person, place, and time.     Cranial Nerves: No dysarthria or facial asymmetry.    Imaging: IR Angiogram Selective Each Additional Vessel  Result Date: 10/04/2020 INDICATION: Timothy Reeves. is a 65 year old male with past medical history significant for coronary artery disease, discoid lupus, erectile dysfunction, gastroesophageal reflux disease with esophagitis, generalized anxiety disorder, hypertension, psoriatic arthritis, hypercholesterolemia and remote history of tobacco use having quit 12 years ago (37 pack years). 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 His family history is significant for mom passing away due to a ruptured brain aneurysm. He underwent a diagnostic cerebral angiogram September 22, 2020 which confirmed the presence of both aneurysms. She comes to our service today for elective treatment of these aneurysm. In anticipation to today's procedure, the patient was started on dual anti-platelet therapy with aspirin and ticagrelor. EXAM: ULTRASOUND-GUIDED VASCULAR ACCESS DIAGNOSTIC CEREBRAL ANGIOGRAM 3D ROTATIONAL ANGIOGRAM ENDOVASCULAR EMBOLIZATION OF LEFT MCA BIFURCATION ANEURYSM ENDOVASCULAR EMBOLIZATION OF LEFT ICA ANEURYSM FLAT PANEL HEAD CT COMPARISON:  Cerebral angiogram September 22, 2020. MEDICATIONS: A total of a 1000 mg of heparin intravascular were administered. ANESTHESIA/SEDATION: The procedure was performed under general anesthesia. CONTRAST:  50 mL of Omnipaque 350 milligram/mL FLUOROSCOPY TIME:  Fluoroscopy Time: 99 minutes 30 seconds (DAP 3,582 mGy). COMPLICATIONS: None immediate. TECHNIQUE: Informed written consent was obtained from the patient after a thorough discussion of the procedural risks, benefits and alternatives. All questions were addressed. Maximal Sterile Barrier Technique was utilized including caps, mask, sterile gowns, sterile gloves, sterile drape, hand hygiene and skin antiseptic. A timeout was performed prior to the initiation of the procedure. Using the modified Seldinger technique and a micropuncture kit, access was gained to the right radial artery at the wrist and a 7 French sheath was placed. Slow intra arterial infusion of 5,000 IU heparin, 5 mg Verapamil and 200 mcg nitroglycerin diluted in patient's own blood was performed. No significant fluctuation in patient's blood pressure seen. Then, a right radial artery angiogram was  obtained via sheath side port. Next, a 5 [place] JB 2 glide catheter was navigated over a 0.035" Terumo Glidewire into the right subclavian artery under fluoroscopic guidance. The catheter was then advanced into the left common carotid artery. Frontal and lateral angiograms of the neck were obtained. Under biplane roadmap, the catheter was advanced into the left internal carotid artery. Frontal and lateral angiograms of the head were obtained. 3D rotational angiograms were acquired and post processed in a separate workstation under concurrent attending physician supervision. Selected images were sent to PACS. Additionally, magnified right anterior oblique and magnified lateral views of the head were obtained, centered on the intracranial left MCA. FINDINGS: 1. Normal brachial artery branching pattern seen. No significant anatomical variation. The right radial artery caliber is adequate for vascular access. 2. Saccular aneurysm is seen in the communicating segment measuring 5.3 x 2.7 mm. The posterior communicating artery originates from the neck of the aneurysm. 3. Duplicated left T5/VVO segment. 4. Wide neck saccular aneurysm is seen at the bifurcation of the proximal M1/MCA, measuring approximately 8 x 5 mm. PROCEDURE: The 5 [place] JB 2 catheter was exchanged over the wire and under biplane roadmap for a 7 [place] Rist catheter which was placed in the distal cervical segment of the left ICA. Magnified right anterior oblique and lateral views of the head were obtained  in the working projections. Under biplane roadmap, triaxial navigation of a Ruthven distal access catheter over a via 17 microcatheter and an Aristotle 14 microguidewire into the distal cervical segment of the left ICA. The Sofia catheter was not navigated more distally due to catheter length limitation related to tortuosity. The microcatheter was subsequently navigated over the microwire into the proximal right M1/MCA division and  subsequently into the aneurysm pouch. Then, a 7 x 4 mm web device was deployed into the aneurysm sac. Magnified frontal and lateral angiograms of the head were obtained in the working projection confirming adequate positioning of the web device. The device was subsequently detached. The via 17 microcatheter was subsequently withdrawn. Follow-up left ICA angiograms with magnified frontal and lateral views of the head confirmed adequate positioning of the web device with adequate aneurysm coverage and preservation of parent arteries. Under biplane roadmap, a phenom 27 microcatheter was navigated over an Aristotle 14 microguidewire into an M2 branch of the distal left MCA. Then, a 4 x 12 mm pipeline flex device was advanced and deployed into the intracranial left ICA supraclinoid segment. Although initial positioning of the device was adequately covering the left ICA communicating segment aneurysm neck, upon deployment of the proximal of the device, significant foreshortening was noticed with partial uncovering of the left ICA aneurysm neck. Under biplane roadmap, a phenom 27 microcatheter was navigated over an Aristotle 14 microguidewire into an M2 branch of the distal left MCA. Then, a 3.75 X 12 mm pipeline flex device was advanced and deployed into the intracranial left ICA supraclinoid segment with adequate coverage of the aneurysm neck. Left ICA angiograms confirmed positioning of the device with adequate device wall apposition. The device construct does not cover the origin of either left MCA/M1 segments. Flat panel CT of the head was obtained and post processed in a separate workstation with concurrent attending physician supervision. Selected images were sent to PACS. No evidence of hemorrhagic complication. Left internal carotid artery angiograms with frontal and lateral full views of head shows brisk contrast opacification of the left anterior circulation with no evidence of thromboembolic. Flow stagnation is  noted in the left MCA bifurcation aneurysm and left ICA communicating segment aneurysm. The catheter construct was subsequently withdrawn. An inflatable band was placed and inflated over the right wrist access site. The vascular sheath was withdrawn and the band was slowly deflated until brisk flow was noted through the arteriotomy site. At this point, the band was reinflated with additional 2 cc of air to obtain patent hemostasis. IMPRESSION: 1. Successful and uncomplicated endovascular embolization of a left MCA wide neck bifurcation aneurysm with use of a WEB device. 2. Successful and uncomplicated endovascular embolization of a left ICA communicating segment aneurysm with use of 2 telescoping pipeline flex flow diverters. PLAN: Continue on dual anti-platelet therapy until next follow-up angiogram, at six-month. Electronically Signed   By: Pedro Earls M.D.   On: 10/04/2020 09:43    Labs:  CBC: Recent Labs    03/29/20 1027 09/22/20 0744 10/03/20 0628  WBC 5.1 4.5 5.2  HGB 13.8 12.2* 14.6  HCT 39.9 37.7* 43.6  PLT 205 237 250    COAGS: Recent Labs    09/22/20 0744 10/03/20 0628  INR 1.0 0.9    BMP: Recent Labs    03/29/20 1027 09/22/20 0744 10/03/20 0628  NA 138 138 138  K 4.2 4.3 4.0  CL 107 108 106  CO2 24 22 21*  GLUCOSE 105*  108* 107*  BUN 21 21 17   CALCIUM 9.3 9.2 9.5  CREATININE 0.81 0.96 1.00  GFRNONAA >60 >60 >60    LIVER FUNCTION TESTS: Recent Labs    03/29/20 1027  BILITOT 1.2  AST 23  ALT 19  ALKPHOS 77  PROT 7.4  ALBUMIN 4.1    Assessment and Plan:  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 Procedure performed 10/03/20 with ICU admission for recovery.  Patient evaluated by PT today with findings of fatigue and shakiness with increased distance. Patient's family is able to assist at d/c but voiced concerns about taking  patient home today. Patient will stay one more night (with transfer out of the ICU to a Med-Surg unit) with further PT evaluation tomorrow.   Tentative discharge plan for tomorrow. NIR will evaluate patient in the morning. Please call NIR with any questions.   Electronically Signed: Soyla Reeves, AGACNP-BC [phone] 10/04/2020, 3:32 PM   I spent a total of 35 Minutes at the the patient's bedside AND on the patient's hospital floor or unit, greater than 50% of which was counseling/coordinating care for aneurysm treatment

## 2020-10-04 NOTE — Evaluation (Signed)
Physical Therapy Evaluation Patient Details Name: Timothy Reeves. MRN: [id number] DOB: 03-Jun-1955 Today's Date: 10/04/2020  History of Present Illness  Pt is a 65 y/o male s/p cerebral angiogram and embolization of L MCA bifurcation aneurysm and L ICA aneurysm. PMH includes COPD, HTN, CAD, and lupus.  Clinical Impression  Pt admitted secondary to problem above with deficits below. Pt initially requiring min guard A for mobility with use of RW, however, with increased distance, pt became very fatigued and shaky and required min A. VSS.  Educated about using RW at home for increased safety. Pt's family able to assist at d/c, but understandably voiced concerns about taking pt home today. Feel pt would benefit from continued acute PT prior to return home to ensure safety with mobility tasks. Will continue to follow acutely.      Recommendations for follow up therapy are one component of a multi-disciplinary discharge planning process, led by the attending physician.  Recommendations may be updated based on patient status, additional functional criteria and insurance authorization.  Follow Up Recommendations Home health PT;Supervision for mobility/OOB (pending progression)    Equipment Recommendations  None recommended by PT    Recommendations for Other Services       Precautions / Restrictions Precautions Precautions: Fall Restrictions Weight Bearing Restrictions: No      Mobility  Bed Mobility Overal bed mobility: Needs Assistance Bed Mobility: Supine to Sit;Sit to Supine     Supine to sit: Min assist Sit to supine: Min assist   General bed mobility comments: Required assist for trunk and LE assist. Increased time required.    Transfers Overall transfer level: Needs assistance Equipment used: Rolling walker (2 wheeled) Transfers: Sit to/from Stand Sit to Stand: Min guard         General transfer comment: Min guard for safety.  Ambulation/Gait Ambulation/Gait  assistance: Min assist;Min guard Gait Distance (Feet): 50 Feet Assistive device: Rolling walker (2 wheeled) Gait Pattern/deviations: Step-through pattern;Decreased stride length Gait velocity: Decreased   General Gait Details: Initially requiring min guard A for safety, however, with increased distance, pt becoming more shaky and required min A. Required standing rest X3. VSS. Further mobility deferred.  Stairs            Wheelchair Mobility    Modified Rankin (Stroke Patients Only)       Balance Overall balance assessment: Needs assistance Sitting-balance support: No upper extremity supported;Feet supported Sitting balance-Leahy Scale: Fair     Standing balance support: Bilateral upper extremity supported;During functional activity Standing balance-Leahy Scale: Poor Standing balance comment: Reliant on BUE support                             Pertinent Vitals/Pain Pain Assessment: Faces Faces Pain Scale: Hurts a little bit Pain Location: headache Pain Descriptors / Indicators: Headache Pain Intervention(s): Limited activity within patient's tolerance;Monitored during session;Repositioned    Home Living Family/patient expects to be discharged to:: Private residence Living Arrangements: Spouse/significant other;Children Available Help at Discharge: Family Type of Home: House Home Access: Stairs to enter Entrance Stairs-Rails: Doctor, general practice of Steps: 4-5 Home Layout: One level Home Equipment: Cane - single point;Walker - 4 wheels;Walker - 2 wheels      Prior Function Level of Independence: Independent               Hand Dominance        Extremity/Trunk Assessment   Upper Extremity Assessment Upper Extremity Assessment: Defer  to OT evaluation    Lower Extremity Assessment Lower Extremity Assessment: Generalized weakness    Cervical / Trunk Assessment Cervical / Trunk Assessment: Normal  Communication    Communication: No difficulties  Cognition Arousal/Alertness: Awake/alert Behavior During Therapy: Anxious Overall Cognitive Status: Impaired/Different from baseline Area of Impairment: Problem solving                             Problem Solving: Slow processing General Comments: Slightly anxious throughout.      General Comments General comments (skin integrity, edema, etc.): Pt's wife and daughter present    Exercises     Assessment/Plan    PT Assessment Patient needs continued PT services  PT Problem List Decreased strength;Decreased balance;Decreased activity tolerance;Decreased mobility;Decreased knowledge of use of DME       PT Treatment Interventions DME instruction;Stair training;Gait training;Functional mobility training;Therapeutic activities;Therapeutic exercise;Balance training;Patient/family education    PT Goals (Current goals can be found in the Care Plan section)  Acute Rehab PT Goals Patient Stated Goal: to be able to walk better PT Goal Formulation: With patient Time For Goal Achievement: 10/18/20 Potential to Achieve Goals: Good    Frequency Min 3X/week   Barriers to discharge        Co-evaluation               AM-PAC PT "6 Clicks" Mobility  Outcome Measure Help needed turning from your back to your side while in a flat bed without using bedrails?: A Little Help needed moving from lying on your back to sitting on the side of a flat bed without using bedrails?: A Little Help needed moving to and from a bed to a chair (including a wheelchair)?: A Little Help needed standing up from a chair using your arms (e.g., wheelchair or bedside chair)?: A Little Help needed to walk in hospital room?: A Little Help needed climbing 3-5 steps with a railing? : A Lot 6 Click Score: 17    End of Session Equipment Utilized During Treatment: Gait belt Activity Tolerance: Patient limited by fatigue Patient left: in bed;with call bell/phone within  reach;with family/visitor present Nurse Communication: Mobility status PT Visit Diagnosis: Unsteadiness on feet (R26.81);Muscle weakness (generalized) (M62.81)    Time: 1225-1300 PT Time Calculation (min) (ACUTE ONLY): 35 min   Charges:   PT Evaluation $PT Eval Moderate Complexity: 1 Mod PT Treatments $Gait Training: 8-22 mins        Cindee Salt, DPT  Acute Rehabilitation Services  Pager: [phone] Office: [phone]   Lehman Prom 10/04/2020, 2:09 PM

## 2020-10-05 ENCOUNTER — Encounter (HOSPITAL_COMMUNITY): Payer: Self-pay

## 2020-10-05 HISTORY — PX: IR NEURO EACH ADD'L AFTER BASIC UNI LEFT (MS): IMG5373

## 2020-10-05 NOTE — TOC Transition Note (Signed)
Transition of Care [facility]) - CM/SW Discharge Note   Patient Details  Name: Timothy Reeves. MRN: [id number] Date of Birth: 1955-08-26  Transition of [facility] CM/SW Contact:  Harriet Masson, RN Phone Number: 10/05/2020, 2:17 PM   Clinical Narrative:   Patient stable for dischage.Orders for DME and Home health PT. Spoke to patient and whife regarding transition needs. Choice offered with list Per CMS guidelines from medicare.gov website with star ratings (copy placed in shadow chart)  Patient agreeable to use in house provider, [employer] for rolling walker to be delivered to the room prior to discharge.  Called [place] and spoke to Amy. Insurance out of network Called second Architectural technologist. Spoke to [place] and referral excepted for PT. Address, phone number and PCP confirmed.    Final next level of care: Home w Home Health Services Barriers to Discharge: Barriers Resolved   Patient Goals and CMS Choice Patient states their goals for this hospitalization and ongoing recovery are:: return home CMS Medicare.gov Compare Post Acute Care list provided to:: Patient Choice offered to / list presented to : Patient  Discharge Placement             Home with home health PT and rolling walker          Discharge Plan and Services   Discharge Planning Services: CM Consult Post Acute Care Choice: Home Health          DME Arranged: Walker rolling DME Agency: [employer] Date DME Agency Contacted: 10/05/20 Time DME Agency Contacted: 1232 Representative spoke with at DME Agency: Ezequiel [place] HH Arranged: PT HH Agency: [facility] Date [facility] Agency Contacted: 10/05/20 Time HH Agency Contacted: 1415 Representative spoke with at [facility] Agency: Kandee Keen  Social Determinants of Health (SDOH) Interventions     Readmission Risk Interventions No flowsheet data found.

## 2020-10-05 NOTE — Evaluation (Signed)
Occupational Therapy Evaluation Patient Details Name: Timothy Reeves. 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 PMH includes COPD, HTN, CAD, and lupus.   Clinical Impression   Patient is s/p cerberal angiogram and embolization of L MCA surgery resulting in functional limitations due to the deficits listed below (see OT problem list). Pt demonstrates supervision for transfers with adls.  Patient will benefit from skilled OT acutely to increase independence and safety with ADLS to allow discharge home.       Recommendations for follow up therapy are one component of a multi-disciplinary discharge planning process, led by the attending physician.  Recommendations may be updated based on patient status, additional functional criteria and insurance authorization.   Follow Up Recommendations  No OT follow up    Equipment Recommendations  None recommended by OT;Other (comment) (RW in room)    Recommendations for Other Services       Precautions / Restrictions Precautions Precautions: Fall      Mobility Bed Mobility Overal bed mobility: Modified Independent                  Transfers Overall transfer level: Needs assistance Equipment used: Rolling walker (2 wheeled) Transfers: Sit to/from Stand Sit to Stand: Supervision              Balance Overall balance assessment: Needs assistance   Sitting balance-Leahy Scale: Fair       Standing balance-Leahy Scale: Fair                             ADL either performed or assessed with clinical judgement   ADL Overall ADL's : Needs assistance/impaired Eating/Feeding: Independent   Grooming: Wash/dry hands   Upper Body Bathing: Supervision/ safety   Lower Body Bathing: Supervison/ safety   Upper Body Dressing : Supervision/safety   Lower Body  Dressing: Supervision/safety   Toilet Transfer: Supervision/safety           Functional mobility during ADLs: Supervision/safety General ADL Comments: educated on shower transfer with RW and using grab bar. pt advised to sponge bath if balance feels off and wife present for all education. advised wife to stay with patient in bathroom first 2 showers     Vision Baseline Vision/History: 1 Wears glasses Ability to See in Adequate Light: 0 Adequate Additional Comments: glasses all times     Perception     Praxis      Pertinent Vitals/Pain Pain Assessment: 0-10 Pain Score: 1  Pain Location: indegestion Pain Intervention(s): Monitored during session;Repositioned     Hand Dominance Right   Extremity/Trunk Assessment Upper Extremity Assessment Upper Extremity Assessment: Overall WFL for tasks assessed   Lower Extremity Assessment Lower Extremity Assessment: Defer to PT evaluation   Cervical / Trunk Assessment Cervical / Trunk Assessment: Normal   Communication Communication Communication: No difficulties   Cognition Arousal/Alertness: Awake/alert Behavior During Therapy: WFL for tasks assessed/performed Overall Cognitive Status: Impaired/Different from baseline Area of Impairment: Problem solving                               General Comments: losing sock that was on R side of patient on the bed after taking them out of his shoes. pt reports "i need you" when  talkin about how well he is doing   General Comments  pt with all questions answered during session    Exercises     Shoulder Instructions      Home Living Family/patient expects to be discharged to:: Private residence Living Arrangements: Spouse/significant other;Children Available Help at Discharge: Family Type of Home: House Home Access: Stairs to enter Secretary/administrator of Steps: 4-5 Entrance Stairs-Rails: Right;Left Home Layout: One level     Bathroom Shower/Tub: Retail banker: Handicapped height     Home Equipment: Cane - single point;Walker - 4 wheels;Walker - 2 wheels          Prior Functioning/Environment Level of Independence: Independent                 OT Problem List: Impaired balance (sitting and/or standing)      OT Treatment/Interventions:      OT Goals(Current goals can be found in the care plan section) Acute Rehab OT Goals Patient Stated Goal: to be able to walk better OT Goal Formulation: With patient  OT Frequency:     Barriers to D/C:            Co-evaluation              AM-PAC OT "6 Clicks" Daily Activity     Outcome Measure Help from another person eating meals?: None Help from another person taking care of personal grooming?: None Help from another person toileting, which includes using toliet, bedpan, or urinal?: A Little Help from another person bathing (including washing, rinsing, drying)?: A Little Help from another person to put on and taking off regular upper body clothing?: A Little Help from another person to put on and taking off regular lower body clothing?: A Little 6 Click Score: 20   End of Session Equipment Utilized During Treatment: Rolling walker Nurse Communication: Mobility status;Precautions  Activity Tolerance: Patient tolerated treatment well Patient left: in bed;with call bell/phone within reach (wife present pending RN giving d/c papers in room)  OT Visit Diagnosis: Unsteadiness on feet (R26.81)                Time: 5003-7048 OT Time Calculation (min): 29 min Charges:  OT General Charges $OT Visit: 1 Visit OT Evaluation $OT Eval Moderate Complexity: 1 Mod OT Treatments $Self Care/Home Management : 8-22 mins   Brynn, OTR/L  Acute Rehabilitation Services Pager: [phone] Office: [phone] .   Mateo Flow 10/05/2020, 4:22 PM

## 2020-10-05 NOTE — Progress Notes (Addendum)
Physical Therapy Treatment Patient Details Name: Timothy Reeves. 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 PMH includes COPD, HTN, CAD, and lupus.    PT Comments    Pt up in chair, reporting mild headache but agreeable to OOB mobility. Pt ambulatory good hallway distance with use of RW, pt with UE trembling with increased distance suspect due to anxiety and partly fatigue. Pt encouraged seated rest to recover. Pt proficiently navigated steps with contact guard for safety only. PT explained activity pacing throughout the day once home to manage fatigability, pt expresses understanding.    HR 49-78 bpm    Recommendations for follow up therapy are one component of a multi-disciplinary discharge planning process, led by the attending physician.  Recommendations may be updated based on patient status, additional functional criteria and insurance authorization.  Follow Up Recommendations  Home health PT;Supervision for mobility/OOB (pending progression)     Equipment Recommendations  Rolling walker with 5" wheels    Recommendations for Other Services       Precautions / Restrictions Precautions Precautions: Fall Restrictions Weight Bearing Restrictions: No     Mobility  Bed Mobility Overal bed mobility: Needs Assistance             General bed mobility comments: up in chair    Transfers Overall transfer level: Needs assistance Equipment used: Rolling walker (2 wheeled) Transfers: Sit to/from Stand Sit to Stand: Supervision         General transfer comment: for safety, cues for hand placement when rising/sitting. STS x2, from recliner and bench seat outside of stairwell.  Ambulation/Gait Ambulation/Gait assistance: Min guard Gait Distance (Feet): 175 Feet (to and from stairwell) Assistive device: Rolling  walker (2 wheeled) Gait Pattern/deviations: Step-through pattern;Decreased stride length Gait velocity: decr   General Gait Details: close guard for safety, verbal cuing for RW use and rest breaks as needed given pt's UE trembling. Seated rest x1 to recover trembling and fatigue.   Stairs Stairs: Yes Stairs assistance: Min guard Stair Management: One rail Right;Step to pattern;Forwards Number of Stairs: 5 General stair comments: cues for step-to pattern for pt comfort and stability, no evidence of LOB or unsteadiness with use of railing   Wheelchair Mobility    Modified Rankin (Stroke Patients Only)       Balance Overall balance assessment: Needs assistance Sitting-balance support: No upper extremity supported;Feet supported Sitting balance-Leahy Scale: Fair     Standing balance support: Bilateral upper extremity supported;During functional activity Standing balance-Leahy Scale: Poor Standing balance comment: Reliant on BUE support                            Cognition Arousal/Alertness: Awake/alert Behavior During Therapy: Anxious Overall Cognitive Status: Impaired/Different from baseline Area of Impairment: Problem solving                             Problem Solving: Difficulty sequencing;Requires verbal cues General Comments: anxious and tremulous with mobility      Exercises      General Comments General comments (skin integrity, edema, etc.): pt's wife at bedside      Pertinent Vitals/Pain Pain Assessment: 0-10 Pain Score: 1  Pain Location: head Pain Descriptors / Indicators: Headache Pain Intervention(s): Limited activity within patient's tolerance;Monitored  during session;Repositioned    Home Living                      Prior Function            PT Goals (current goals can now be found in the care plan section) Acute Rehab PT Goals Patient Stated Goal: to be able to walk better PT Goal Formulation: With  patient Time For Goal Achievement: 10/18/20 Potential to Achieve Goals: Good Progress towards PT goals: Progressing toward goals    Frequency    Min 3X/week      PT Plan Current plan remains appropriate    Co-evaluation              AM-PAC PT "6 Clicks" Mobility   Outcome Measure  Help needed turning from your back to your side while in a flat bed without using bedrails?: A Little Help needed moving from lying on your back to sitting on the side of a flat bed without using bedrails?: A Little Help needed moving to and from a bed to a chair (including a wheelchair)?: A Little Help needed standing up from a chair using your arms (e.g., wheelchair or bedside chair)?: A Little Help needed to walk in hospital room?: A Little Help needed climbing 3-5 steps with a railing? : A Little 6 Click Score: 18    End of Session   Activity Tolerance: Patient limited by fatigue Patient left: with call bell/phone within reach;with family/visitor present;in chair (pt up in chair upon PT arrival with no chair alarm, wife present and pt expresses he understands to press call button and wait for assist prior to mobilizing out of chair.) Nurse Communication: Mobility status PT Visit Diagnosis: Unsteadiness on feet (R26.81);Muscle weakness (generalized) (M62.81)     Time: 3335-4562 PT Time Calculation (min) (ACUTE ONLY): 29 min  Charges:  $Gait Training: 23-37 mins                     Marye Round, PT DPT Acute Rehabilitation Services Pager [phone]  Office [phone]    Yu Peggs E Christain [place] 10/05/2020, 10:42 AM

## 2020-10-05 NOTE — Discharge Summary (Addendum)
Patient ID: Timothy Reeves. MRN: [id number] DOB/AGE: July 04, 1955 65 y.o.  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 Procedure performed 10/03/20 with ICU admission for recovery/overnight observation  Discharge Diagnoses:  Active Problems:   Aneurysm, cerebral, nonruptured  Discharged Condition: good  Hospital Course: Timothy Reeves., 65 year old male, presented 10/03/20 to the [facility] Neuro Interventional Radiology department for elective aneurysm treatments under general anesthesia with plans for one night of observation in the ICU. The patient was assessed yesterday by physical therapy and found to be easily fatigued/weak during ambulation. The family expressed concerns about taking the patient home and the decision was made to keep the patient for one more night of observation. He was transferred out of the ICU to a step down unit.   Timothy Reeves was seen this morning in his room and his wife was also in the room. Timothy Reeves has worked with Physical Therapy this morning and has improved compared to yesterday. Both he and his wife state they are ready for discharge today. He still has a mild headache but it has improved since yesterday. He expressed some concern that he continues to cough up some brown phlegm but this symptom has also improved. He was advised to notify [facility] if any of these symptoms worsen.   The patient's daughter will pick up the patient around 3 pm today for transportation home. He will follow up with Dr. Karenann Cai in 6 months for a diagnostic cerebral angiogram and a scheduler from our office will call him to arrange a date/time. He knows to continue taking 81 mg aspirin daily and 90 mg brilinta  twice daily until instructed otherwise. He was instructed to avoid lifting anything greater than 5 lbs for two weeks and to avoid taking ibuprofen, additional aspirin (beyond the daily 81 mg aspirin) or other medications with blood-thinning abilities. The patient and his wife know they can call our office with any questions/concerns.   Addendum** Physical Therapy made recommendations for Home Health Physical Therapy and a Rolling Walker. These have been ordered.   Consults: rehabilitation medicine/PT  Significant Diagnostic Studies: MR ANGIO HEAD WO CONTRAST  Addendum Date: 09/15/2020   ADDENDUM REPORT: 09/15/2020 10:07 ADDENDUM: MRA impressions 1 and 2 called by telephone on 09/15/2020 at 10:07 am to provider CREIGHTON VAUGHT , who verbally acknowledged these results. Electronically Signed   By: Kellie Simmering D.O.   On: 09/15/2020 10:07   Result Date: 09/15/2020 CLINICAL DATA:  Dizziness. Additional history provided by scanning technologist: Patient reports dizziness when looking up, ringing in both ears, symptoms for years. EXAM: MRI HEAD WITHOUT AND WITH CONTRAST MRA HEAD WITHOUT CONTRAST TECHNIQUE: Multiplanar, multi-echo pulse sequences of the brain and surrounding structures were acquired without and with intravenous contrast. Angiographic images of the Circle of Willis were acquired using MRA technique without intravenous contrast. CONTRAST:  69m GADAVIST GADOBUTROL 1 MMOL/ML IV SOLN COMPARISON:  No pertinent prior exam. FINDINGS: MRI HEAD FINDINGS Brain: Mild generalized cerebral atrophy. Mild multifocal T2/FLAIR hyperintensity within the cerebral white matter, nonspecific but compatible with chronic small vessel ischemic disease. Small chronic lacunar infarcts within the bilateral basal ganglia. 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 No evidence of an intracranial mass. Specifically, no cerebellopontine angle or internal auditory canal mass is  demonstrated.  Unremarkable appearance of the seventh and eighth cranial nerves bilaterally. There is no acute infarct. No chronic intracranial blood products. No extra-axial fluid collection. No midline shift. Incidentally noted cavum septum pellucidum and cavum vergae. Left M1/M2 MCA aneurysm as described below. Otherwise, no pathologic intracranial enhancement. Vascular: Maintained flow voids within the proximal large arterial vessels. Please see concurrently performed MRA for a description of a left M1/M2 MCA aneurysm. Skull and upper cervical spine: No focal suspicious marrow lesion. Sinuses/Orbits: Visualized orbits show no acute finding. Mild mucosal thickening within the bilateral ethmoid air cells. Mild mucosal thickening within the right maxillary sinus. Moderate mucosal thickening within the left maxillary sinus. Other: Right mastoid effusion. MRA HEAD FINDINGS Anterior circulation: The intracranial internal carotid arteries are patent. Duplicated left middle cerebral artery. The M1 middle cerebral arteries are patent. No M2 proximal branch occlusion or high-grade proximal stenosis is identified. The anterior cerebral arteries are patent. 5 mm inferolaterally projecting aneurysm arising from the supraclinoid left ICA (for instance as seen on series 5, image 79). 9 x 6 mm saccular aneurysm arising from the left middle cerebral artery at the M1/M2 junction (series 5, image 98). Posterior circulation: The intracranial vertebral arteries are patent. Left vertebral artery dominant. The basilar artery is patent. The posterior cerebral arteries are patent. Posterior communicating arteries are hypoplastic or absent bilaterally. Anatomic variants: As described. MRA head impressions #1 and #2 will be called to the ordering clinician or representative by the Radiologist Assistant, and communication documented in the PACS or [employer]. IMPRESSION: MRI brain: 1. No evidence of acute intracranial abnormality. 2. No  cerebellopontine angle or internal auditory canal mass. 3. Mild chronic small-vessel ischemic changes within the cerebral white matter. 4. Small chronic lacunar infarcts within the bilateral basal ganglia. 5. 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 6. Mild generalized cerebral atrophy. 7. Paranasal sinus disease, as described. 8. Right mastoid effusion. MRA head: 1. Duplicated left middle cerebral artery. There is a 9 x 6 mm saccular aneurysm arising from the left middle cerebral artery at the M1/M2 junction. Neuro-interventional consultation is recommended. 2. 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 Neuro-interventional consultation is recommended. 3. No intracranial large vessel occlusion or proximal high-grade arterial stenosis. Electronically Signed: By: Kellie Simmering D.O. On: 09/15/2020 09:12   IR Transcath/Emboliz  Result Date: 10/04/2020 INDICATION: Timothy Reeves. is a 65 year old male with past medical history significant for coronary artery disease, discoid lupus, erectile dysfunction, gastroesophageal reflux disease with esophagitis, generalized anxiety disorder, hypertension, psoriatic arthritis, hypercholesterolemia and remote history of tobacco use having quit 12 years ago (37 pack years). 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 His family history is significant for mom passing away due to a ruptured brain aneurysm. He underwent a diagnostic cerebral angiogram September 22, 2020 which confirmed the presence of both aneurysms. She comes to our service today for elective treatment of these aneurysm. In anticipation to today's procedure, the patient was started on dual anti-platelet therapy with aspirin and ticagrelor. EXAM: ULTRASOUND-GUIDED VASCULAR ACCESS DIAGNOSTIC CEREBRAL  ANGIOGRAM 3D ROTATIONAL ANGIOGRAM ENDOVASCULAR EMBOLIZATION OF LEFT MCA BIFURCATION ANEURYSM ENDOVASCULAR EMBOLIZATION OF LEFT ICA ANEURYSM FLAT PANEL HEAD CT COMPARISON:  Cerebral angiogram September 22, 2020. MEDICATIONS: A total of a 1000 mg of heparin intravascular were administered. ANESTHESIA/SEDATION: The procedure was  performed under general anesthesia. CONTRAST:  50 mL of Omnipaque 350 milligram/mL FLUOROSCOPY TIME:  Fluoroscopy Time: 99 minutes 30 seconds (DAP 3,582 mGy). COMPLICATIONS: None immediate. TECHNIQUE: Informed written consent was obtained from the patient after a thorough discussion of the procedural risks, benefits and alternatives. All questions were addressed. Maximal Sterile Barrier Technique was utilized including caps, mask, sterile gowns, sterile gloves, sterile drape, hand hygiene and skin antiseptic. A timeout was performed prior to the initiation of the procedure. Using the modified Seldinger technique and a micropuncture kit, access was gained to the right radial artery at the wrist and a 7 French sheath was placed. Slow intra arterial infusion of 5,000 IU heparin, 5 mg Verapamil and 200 mcg nitroglycerin diluted in patient's own blood was performed. No significant fluctuation in patient's blood pressure seen. Then, a right radial artery angiogram was obtained via sheath side port. Next, a 5 [place] JB 2 glide catheter was navigated over a 0.035" Terumo Glidewire into the right subclavian artery under fluoroscopic guidance. The catheter was then advanced into the left common carotid artery. Frontal and lateral angiograms of the neck were obtained. Under biplane roadmap, the catheter was advanced into the left internal carotid artery. Frontal and lateral angiograms of the head were obtained. 3D rotational angiograms were acquired and post processed in a separate workstation under concurrent attending physician supervision. Selected images were sent to PACS. Additionally, magnified  right anterior oblique and magnified lateral views of the head were obtained, centered on the intracranial left MCA. FINDINGS: 1. Normal brachial artery branching pattern seen. No significant anatomical variation. The right radial artery caliber is adequate for vascular access. 2. Saccular aneurysm is seen in the communicating segment measuring 5.3 x 2.7 mm. The posterior communicating artery originates from the neck of the aneurysm. 3. Duplicated left L3/YBO segment. 4. Wide neck saccular aneurysm is seen at the bifurcation of the proximal M1/MCA, measuring approximately 8 x 5 mm. PROCEDURE: The 5 [place] JB 2 catheter was exchanged over the wire and under biplane roadmap for a 7 [place] Rist catheter which was placed in the distal cervical segment of the left ICA. Magnified right anterior oblique and lateral views of the head were obtained in the working projections. Under biplane roadmap, triaxial navigation of a [place] distal access catheter over a via 17 microcatheter and an Aristotle 14 microguidewire into the distal cervical segment of the left ICA. The [place] catheter was not navigated more distally due to catheter length limitation related to tortuosity. The microcatheter was subsequently navigated over the microwire into the proximal right M1/MCA division and subsequently into the aneurysm pouch. Then, a 7 x 4 mm web device was deployed into the aneurysm sac. Magnified frontal and lateral angiograms of the head were obtained in the working projection confirming adequate positioning of the web device. The device was subsequently detached. The via 17 microcatheter was subsequently withdrawn. Follow-up left ICA angiograms with magnified frontal and lateral views of the head confirmed adequate positioning of the web device with adequate aneurysm coverage and preservation of parent arteries. Under biplane roadmap, a phenom 27 microcatheter was navigated over an Aristotle 14 microguidewire into an M2  branch of the distal left MCA. Then, a 4 x 12 mm pipeline flex device was advanced and deployed into the intracranial left ICA supraclinoid segment. Although initial positioning of the device was adequately covering the left ICA communicating segment aneurysm neck, upon deployment of the proximal of the device, significant foreshortening was noticed with  partial uncovering of the left ICA aneurysm neck. Under biplane roadmap, a phenom 27 microcatheter was navigated over an Aristotle 14 microguidewire into an M2 branch of the distal left MCA. Then, a 3.75 X 12 mm pipeline flex device was advanced and deployed into the intracranial left ICA supraclinoid segment with adequate coverage of the aneurysm neck. Left ICA angiograms confirmed positioning of the device with adequate device wall apposition. The device construct does not cover the origin of either left MCA/M1 segments. Flat panel CT of the head was obtained and post processed in a separate workstation with concurrent attending physician supervision. Selected images were sent to PACS. No evidence of hemorrhagic complication. Left internal carotid artery angiograms with frontal and lateral full views of head shows brisk contrast opacification of the left anterior circulation with no evidence of thromboembolic. Flow stagnation is noted in the left MCA bifurcation aneurysm and left ICA communicating segment aneurysm. The catheter construct was subsequently withdrawn. An inflatable band was placed and inflated over the right wrist access site. The vascular sheath was withdrawn and the band was slowly deflated until brisk flow was noted through the arteriotomy site. At this point, the band was reinflated with additional 2 cc of air to obtain patent hemostasis. IMPRESSION: 1. Successful and uncomplicated endovascular embolization of a left MCA wide neck bifurcation aneurysm with use of a WEB device. 2. Successful and uncomplicated endovascular embolization of a left  ICA communicating segment aneurysm with use of 2 telescoping pipeline flex flow diverters. PLAN: Continue on dual anti-platelet therapy until next follow-up angiogram, at six-month. Electronically Signed   By: Pedro Earls M.D.   On: 10/04/2020 09:43   IR Angiogram Follow Up Study  Result Date: 10/04/2020 INDICATION: Timothy Reeves. is a 65 year old male with past medical history significant for coronary artery disease, discoid lupus, erectile dysfunction, gastroesophageal reflux disease with esophagitis, generalized anxiety disorder, hypertension, psoriatic arthritis, hypercholesterolemia and remote history of tobacco use having quit 12 years ago (37 pack years). 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 His family history is significant for mom passing away due to a ruptured brain aneurysm. He underwent a diagnostic cerebral angiogram September 22, 2020 which confirmed the presence of both aneurysms. She comes to our service today for elective treatment of these aneurysm. In anticipation to today's procedure, the patient was started on dual anti-platelet therapy with aspirin and ticagrelor. EXAM: ULTRASOUND-GUIDED VASCULAR ACCESS DIAGNOSTIC CEREBRAL ANGIOGRAM 3D ROTATIONAL ANGIOGRAM ENDOVASCULAR EMBOLIZATION OF LEFT MCA BIFURCATION ANEURYSM ENDOVASCULAR EMBOLIZATION OF LEFT ICA ANEURYSM FLAT PANEL HEAD CT COMPARISON:  Cerebral angiogram September 22, 2020. MEDICATIONS: A total of a 1000 mg of heparin intravascular were administered. ANESTHESIA/SEDATION: The procedure was performed under general anesthesia. CONTRAST:  50 mL of Omnipaque 350 milligram/mL FLUOROSCOPY TIME:  Fluoroscopy Time: 99 minutes 30 seconds (DAP 3,582 mGy). COMPLICATIONS: None immediate. TECHNIQUE: Informed written consent was obtained from the patient after a thorough  discussion of the procedural risks, benefits and alternatives. All questions were addressed. Maximal Sterile Barrier Technique was utilized including caps, mask, sterile gowns, sterile gloves, sterile drape, hand hygiene and skin antiseptic. A timeout was performed prior to the initiation of the procedure. Using the modified Seldinger technique and a micropuncture kit, access was gained to the right radial artery at the wrist and a 7 French sheath was placed. Slow  intra arterial infusion of 5,000 IU heparin, 5 mg Verapamil and 200 mcg nitroglycerin diluted in patient's own blood was performed. No significant fluctuation in patient's blood pressure seen. Then, a right radial artery angiogram was obtained via sheath side port. Next, a 5 [place] JB 2 glide catheter was navigated over a 0.035" Terumo Glidewire into the right subclavian artery under fluoroscopic guidance. The catheter was then advanced into the left common carotid artery. Frontal and lateral angiograms of the neck were obtained. Under biplane roadmap, the catheter was advanced into the left internal carotid artery. Frontal and lateral angiograms of the head were obtained. 3D rotational angiograms were acquired and post processed in a separate workstation under concurrent attending physician supervision. Selected images were sent to PACS. Additionally, magnified right anterior oblique and magnified lateral views of the head were obtained, centered on the intracranial left MCA. FINDINGS: 1. Normal brachial artery branching pattern seen. No significant anatomical variation. The right radial artery caliber is adequate for vascular access. 2. Saccular aneurysm is seen in the communicating segment measuring 5.3 x 2.7 mm. The posterior communicating artery originates from the neck of the aneurysm. 3. Duplicated left Z0/CHE segment. 4. Wide neck saccular aneurysm is seen at the bifurcation of the proximal M1/MCA, measuring approximately 8 x 5 mm. PROCEDURE: The 5  [place] JB 2 catheter was exchanged over the wire and under biplane roadmap for a 7 [place] Rist catheter which was placed in the distal cervical segment of the left ICA. Magnified right anterior oblique and lateral views of the head were obtained in the working projections. Under biplane roadmap, triaxial navigation of a [place] distal access catheter over a via 17 microcatheter and an Aristotle 14 microguidewire into the distal cervical segment of the left ICA. The Sof[place] catheter was not navigated more distally due to catheter length limitation related to tortuosity. The microcatheter was subsequently navigated over the microwire into the proximal right M1/MCA division and subsequently into the aneurysm pouch. Then, a 7 x 4 mm web device was deployed into the aneurysm sac. Magnified frontal and lateral angiograms of the head were obtained in the working projection confirming adequate positioning of the web device. The device was subsequently detached. The via 17 microcatheter was subsequently withdrawn. Follow-up left ICA angiograms with magnified frontal and lateral views of the head confirmed adequate positioning of the web device with adequate aneurysm coverage and preservation of parent arteries. Under biplane roadmap, a phenom 27 microcatheter was navigated over an Aristotle 14 microguidewire into an M2 branch of the distal left MCA. Then, a 4 x 12 mm pipeline flex device was advanced and deployed into the intracranial left ICA supraclinoid segment. Although initial positioning of the device was adequately covering the left ICA communicating segment aneurysm neck, upon deployment of the proximal of the device, significant foreshortening was noticed with partial uncovering of the left ICA aneurysm neck. Under biplane roadmap, a phenom 27 microcatheter was navigated over an Aristotle 14 microguidewire into an M2 branch of the distal left MCA. Then, a 3.75 X 12 mm pipeline flex device was advanced and  deployed into the intracranial left ICA supraclinoid segment with adequate coverage of the aneurysm neck. Left ICA angiograms confirmed positioning of the device with adequate device wall apposition. The device construct does not cover the origin of either left MCA/M1 segments. Flat panel CT of the head was obtained and post processed in a separate workstation with concurrent attending physician supervision. Selected images were sent to PACS.  No evidence of hemorrhagic complication. Left internal carotid artery angiograms with frontal and lateral full views of head shows brisk contrast opacification of the left anterior circulation with no evidence of thromboembolic. Flow stagnation is noted in the left MCA bifurcation aneurysm and left ICA communicating segment aneurysm. The catheter construct was subsequently withdrawn. An inflatable band was placed and inflated over the right wrist access site. The vascular sheath was withdrawn and the band was slowly deflated until brisk flow was noted through the arteriotomy site. At this point, the band was reinflated with additional 2 cc of air to obtain patent hemostasis. IMPRESSION: 1. Successful and uncomplicated endovascular embolization of a left MCA wide neck bifurcation aneurysm with use of a WEB device. 2. Successful and uncomplicated endovascular embolization of a left ICA communicating segment aneurysm with use of 2 telescoping pipeline flex flow diverters. PLAN: Continue on dual anti-platelet therapy until next follow-up angiogram, at six-month. Electronically Signed   By: Pedro Earls M.D.   On: 10/04/2020 09:43   IR Angiogram Follow Up Study  Result Date: 10/04/2020 INDICATION: Dashton Czerwinski. is a 65 year old male with past medical history significant for coronary artery disease, discoid lupus, erectile dysfunction, gastroesophageal reflux disease with esophagitis, generalized anxiety disorder, hypertension, psoriatic arthritis,  hypercholesterolemia and remote history of tobacco use having quit 12 years ago (37 pack years). 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 His family history is significant for mom passing away due to a ruptured brain aneurysm. He underwent a diagnostic cerebral angiogram September 22, 2020 which confirmed the presence of both aneurysms. She comes to our service today for elective treatment of these aneurysm. In anticipation to today's procedure, the patient was started on dual anti-platelet therapy with aspirin and ticagrelor. EXAM: ULTRASOUND-GUIDED VASCULAR ACCESS DIAGNOSTIC CEREBRAL ANGIOGRAM 3D ROTATIONAL ANGIOGRAM ENDOVASCULAR EMBOLIZATION OF LEFT MCA BIFURCATION ANEURYSM ENDOVASCULAR EMBOLIZATION OF LEFT ICA ANEURYSM FLAT PANEL HEAD CT COMPARISON:  Cerebral angiogram September 22, 2020. MEDICATIONS: A total of a 1000 mg of heparin intravascular were administered. ANESTHESIA/SEDATION: The procedure was performed under general anesthesia. CONTRAST:  50 mL of Omnipaque 350 milligram/mL FLUOROSCOPY TIME:  Fluoroscopy Time: 99 minutes 30 seconds (DAP 3,582 mGy). COMPLICATIONS: None immediate. TECHNIQUE: Informed written consent was obtained from the patient after a thorough discussion of the procedural risks, benefits and alternatives. All questions were addressed. Maximal Sterile Barrier Technique was utilized including caps, mask, sterile gowns, sterile gloves, sterile drape, hand hygiene and skin antiseptic. A timeout was performed prior to the initiation of the procedure. Using the modified Seldinger technique and a micropuncture kit, access was gained to the right radial artery at the wrist and a 7 French sheath was placed. Slow intra arterial infusion of 5,000 IU heparin, 5 mg Verapamil and 200 mcg nitroglycerin diluted in patient's own blood was performed. No  significant fluctuation in patient's blood pressure seen. Then, a right radial artery angiogram was obtained via sheath side port. Next, a 5 [place] JB 2 glide catheter was navigated over a 0.035" Terumo Glidewire into the right subclavian artery under fluoroscopic guidance. The catheter was then advanced into the left common carotid artery. Frontal and lateral angiograms of the neck were obtained. Under biplane roadmap, the catheter was advanced into the left internal carotid artery. Frontal and lateral angiograms of the head were obtained. 3D rotational angiograms were  acquired and post processed in a separate workstation under concurrent attending physician supervision. Selected images were sent to PACS. Additionally, magnified right anterior oblique and magnified lateral views of the head were obtained, centered on the intracranial left MCA. FINDINGS: 1. Normal brachial artery branching pattern seen. No significant anatomical variation. The right radial artery caliber is adequate for vascular access. 2. Saccular aneurysm is seen in the communicating segment measuring 5.3 x 2.7 mm. The posterior communicating artery originates from the neck of the aneurysm. 3. Duplicated left A4/TXM segment. 4. Wide neck saccular aneurysm is seen at the bifurcation of the proximal M1/MCA, measuring approximately 8 x 5 mm. PROCEDURE: The 5 [place] JB 2 catheter was exchanged over the wire and under biplane roadmap for a 7 [place] Rist catheter which was placed in the distal cervical segment of the left ICA. Magnified right anterior oblique and lateral views of the head were obtained in the working projections. Under biplane roadmap, triaxial navigation of a [place] distal access catheter over a via 17 microcatheter and an Aristotle 14 microguidewire into the distal cervical segment of the left ICA. The [place] catheter was not navigated more distally due to catheter length limitation related to tortuosity. The microcatheter  was subsequently navigated over the microwire into the proximal right M1/MCA division and subsequently into the aneurysm pouch. Then, a 7 x 4 mm web device was deployed into the aneurysm sac. Magnified frontal and lateral angiograms of the head were obtained in the working projection confirming adequate positioning of the web device. The device was subsequently detached. The via 17 microcatheter was subsequently withdrawn. Follow-up left ICA angiograms with magnified frontal and lateral views of the head confirmed adequate positioning of the web device with adequate aneurysm coverage and preservation of parent arteries. Under biplane roadmap, a phenom 27 microcatheter was navigated over an Aristotle 14 microguidewire into an M2 branch of the distal left MCA. Then, a 4 x 12 mm pipeline flex device was advanced and deployed into the intracranial left ICA supraclinoid segment. Although initial positioning of the device was adequately covering the left ICA communicating segment aneurysm neck, upon deployment of the proximal of the device, significant foreshortening was noticed with partial uncovering of the left ICA aneurysm neck. Under biplane roadmap, a phenom 27 microcatheter was navigated over an Aristotle 14 microguidewire into an M2 branch of the distal left MCA. Then, a 3.75 X 12 mm pipeline flex device was advanced and deployed into the intracranial left ICA supraclinoid segment with adequate coverage of the aneurysm neck. Left ICA angiograms confirmed positioning of the device with adequate device wall apposition. The device construct does not cover the origin of either left MCA/M1 segments. Flat panel CT of the head was obtained and post processed in a separate workstation with concurrent attending physician supervision. Selected images were sent to PACS. No evidence of hemorrhagic complication. Left internal carotid artery angiograms with frontal and lateral full views of head shows brisk contrast  opacification of the left anterior circulation with no evidence of thromboembolic. Flow stagnation is noted in the left MCA bifurcation aneurysm and left ICA communicating segment aneurysm. The catheter construct was subsequently withdrawn. An inflatable band was placed and inflated over the right wrist access site. The vascular sheath was withdrawn and the band was slowly deflated until brisk flow was noted through the arteriotomy site. At this point, the band was reinflated with additional 2 cc of air to obtain patent hemostasis. IMPRESSION: 1. Successful and uncomplicated endovascular  embolization of a left MCA wide neck bifurcation aneurysm with use of a WEB device. 2. Successful and uncomplicated endovascular embolization of a left ICA communicating segment aneurysm with use of 2 telescoping pipeline flex flow diverters. PLAN: Continue on dual anti-platelet therapy until next follow-up angiogram, at six-month. Electronically Signed   By: Pedro Earls M.D.   On: 10/04/2020 09:43   IR 3D Independent Timothy Reeves  Result Date: 10/04/2020 INDICATION: Marny Lowenstein Timothy Reeves. is a 65 year old male with past medical history significant for coronary artery disease, discoid lupus, erectile dysfunction, gastroesophageal reflux disease with esophagitis, generalized anxiety disorder, hypertension, psoriatic arthritis, hypercholesterolemia and remote history of tobacco use having quit 12 years ago (37 pack years). 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 His family history is significant for mom passing away due to a ruptured brain aneurysm. He underwent a diagnostic cerebral angiogram September 22, 2020 which confirmed the presence of both aneurysms. She comes to our service today for elective treatment of these aneurysm. In anticipation to today's procedure, the  patient was started on dual anti-platelet therapy with aspirin and ticagrelor. EXAM: ULTRASOUND-GUIDED VASCULAR ACCESS DIAGNOSTIC CEREBRAL ANGIOGRAM 3D ROTATIONAL ANGIOGRAM ENDOVASCULAR EMBOLIZATION OF LEFT MCA BIFURCATION ANEURYSM ENDOVASCULAR EMBOLIZATION OF LEFT ICA ANEURYSM FLAT PANEL HEAD CT COMPARISON:  Cerebral angiogram September 22, 2020. MEDICATIONS: A total of a 1000 mg of heparin intravascular were administered. ANESTHESIA/SEDATION: The procedure was performed under general anesthesia. CONTRAST:  50 mL of Omnipaque 350 milligram/mL FLUOROSCOPY TIME:  Fluoroscopy Time: 99 minutes 30 seconds (DAP 3,582 mGy). COMPLICATIONS: None immediate. TECHNIQUE: Informed written consent was obtained from the patient after a thorough discussion of the procedural risks, benefits and alternatives. All questions were addressed. Maximal Sterile Barrier Technique was utilized including caps, mask, sterile gowns, sterile gloves, sterile drape, hand hygiene and skin antiseptic. A timeout was performed prior to the initiation of the procedure. Using the modified Seldinger technique and a micropuncture kit, access was gained to the right radial artery at the wrist and a 7 French sheath was placed. Slow intra arterial infusion of 5,000 IU heparin, 5 mg Verapamil and 200 mcg nitroglycerin diluted in patient's own blood was performed. No significant fluctuation in patient's blood pressure seen. Then, a right radial artery angiogram was obtained via sheath side port. Next, a 5 [place] JB 2 glide catheter was navigated over a 0.035" Terumo Glidewire into the right subclavian artery under fluoroscopic guidance. The catheter was then advanced into the left common carotid artery. Frontal and lateral angiograms of the neck were obtained. Under biplane roadmap, the catheter was advanced into the left internal carotid artery. Frontal and lateral angiograms of the head were obtained. 3D rotational angiograms were acquired and post processed  in a separate workstation under concurrent attending physician supervision. Selected images were sent to PACS. Additionally, magnified right anterior oblique and magnified lateral views of the head were obtained, centered on the intracranial left MCA. FINDINGS: 1. Normal brachial artery branching pattern seen. No significant anatomical variation. The right radial artery caliber is adequate for vascular access. 2. Saccular aneurysm is seen in the communicating segment measuring 5.3 x 2.7 mm. The posterior communicating artery originates from the neck of the aneurysm. 3. Duplicated left I4/PYK segment. 4. Wide neck saccular aneurysm is seen at the bifurcation of the proximal M1/MCA, measuring approximately 8 x 5 mm.  PROCEDURE: The 5 [place] JB 2 catheter was exchanged over the wire and under biplane roadmap for a 7 [place] Rist catheter which was placed in the distal cervical segment of the left ICA. Magnified right anterior oblique and lateral views of the head were obtained in the working projections. Under biplane roadmap, triaxial navigation of a [place] distal access catheter over a via 17 microcatheter and an Aristotle 14 microguidewire into the distal cervical segment of the left ICA. The Sof[place] catheter was not navigated more distally due to catheter length limitation related to tortuosity. The microcatheter was subsequently navigated over the microwire into the proximal right M1/MCA division and subsequently into the aneurysm pouch. Then, a 7 x 4 mm web device was deployed into the aneurysm sac. Magnified frontal and lateral angiograms of the head were obtained in the working projection confirming adequate positioning of the web device. The device was subsequently detached. The via 17 microcatheter was subsequently withdrawn. Follow-up left ICA angiograms with magnified frontal and lateral views of the head confirmed adequate positioning of the web device with adequate aneurysm coverage and  preservation of parent arteries. Under biplane roadmap, a phenom 27 microcatheter was navigated over an Aristotle 14 microguidewire into an M2 branch of the distal left MCA. Then, a 4 x 12 mm pipeline flex device was advanced and deployed into the intracranial left ICA supraclinoid segment. Although initial positioning of the device was adequately covering the left ICA communicating segment aneurysm neck, upon deployment of the proximal of the device, significant foreshortening was noticed with partial uncovering of the left ICA aneurysm neck. Under biplane roadmap, a phenom 27 microcatheter was navigated over an Aristotle 14 microguidewire into an M2 branch of the distal left MCA. Then, a 3.75 X 12 mm pipeline flex device was advanced and deployed into the intracranial left ICA supraclinoid segment with adequate coverage of the aneurysm neck. Left ICA angiograms confirmed positioning of the device with adequate device wall apposition. The device construct does not cover the origin of either left MCA/M1 segments. Flat panel CT of the head was obtained and post processed in a separate workstation with concurrent attending physician supervision. Selected images were sent to PACS. No evidence of hemorrhagic complication. Left internal carotid artery angiograms with frontal and lateral full views of head shows brisk contrast opacification of the left anterior circulation with no evidence of thromboembolic. Flow stagnation is noted in the left MCA bifurcation aneurysm and left ICA communicating segment aneurysm. The catheter construct was subsequently withdrawn. An inflatable band was placed and inflated over the right wrist access site. The vascular sheath was withdrawn and the band was slowly deflated until brisk flow was noted through the arteriotomy site. At this point, the band was reinflated with additional 2 cc of air to obtain patent hemostasis. IMPRESSION: 1. Successful and uncomplicated endovascular  embolization of a left MCA wide neck bifurcation aneurysm with use of a WEB device. 2. Successful and uncomplicated endovascular embolization of a left ICA communicating segment aneurysm with use of 2 telescoping pipeline flex flow diverters. PLAN: Continue on dual anti-platelet therapy until next follow-up angiogram, at six-month. Electronically Signed   By: Pedro Earls M.D.   On: 10/04/2020 09:43   IR 3D Independent Timothy Reeves  Result Date: 09/22/2020 INDICATION: Timothy Reeves. is a 65 y.o. male with past medical history significant for coronary artery disease, discoid lupus, erectile dysfunction, gastroesophageal reflux disease with esophagitis, generalized anxiety disorder, hypertension, psoriatic arthritis, hypercholesterolemia and  remote history of tobacco use recently found to have a 48m left ICA aneurysm and 971mleft MCA bifurcation aneurysm. He comes to our service today for a diagnostic cerebral angiogram better evaluate his aneurysms. EXAM: ULTRASOUND-GUIDED VASCULAR ACCESS DIAGNOSTIC CEREBRAL ANGIOGRAM 3D ROTATIONAL ANGIOGRAM COMPARISON:  MR angiogram September 14, 2020. MEDICATIONS: No antibiotics prescribed. ANESTHESIA/SEDATION: Versed 2 mg IV; Fentanyl 100 mcg IV Moderate Sedation Time:  67 minutes The patient was continuously monitored during the procedure by the interventional radiology nurse under my direct supervision. CONTRAST:  24 mL of Omnipaque 350 milligram/mL; 88 mL of Omnipaque 300 milligram/mL FLUOROSCOPY TIME:  Fluoroscopy Time: 14 minutes (700 mGy). COMPLICATIONS: None immediate. TECHNIQUE: Informed written consent was obtained from the patient after a thorough discussion of the procedural risks, benefits and alternatives. All questions were addressed. Maximal Sterile Barrier Technique was utilized including caps, mask, sterile gowns, sterile gloves, sterile drape, hand hygiene and skin antiseptic. A timeout was performed prior to the initiation of the procedure.  Using the modified Seldinger technique and a micropuncture kit, access was gained to the right radial artery at the wrist and a 5 French sheath was placed. Real-time ultrasound guidance was utilized for vascular access including the acquisition of a permanent ultrasound image documenting patency of the accessed vessel. Slow intra arterial infusion of 5,000 IU heparin, 5 mg Verapamil and 200 mcg nitroglycerin diluted in patient's own blood was performed. No significant fluctuation in patient's blood pressure seen. Then, a right radial artery angiogram was obtained via sheath side port. Next, a 5 [place] 2 glide catheter was navigated over a 0.035" Terumo Glidewire into the right subclavian artery under fluoroscopic guidance. Under fluoroscopy, the catheter was placed into the right common carotid artery. Frontal and lateral angiograms of the neck were obtained. Using biplane roadmap, the catheter was the catheter was advanced into the right internal carotid artery. Frontal and lateral angiograms were obtained followed by magnified right anterior oblique and magnified lateral views of the. Next, the catheter was placed into the left common carotid artery. Frontal lateral angiograms of the neck were obtained. Under biplane roadmap, the catheter was advanced into the left internal carotid artery. Frontal and lateral angiograms of the head were obtained. 3D rotational angiograms were acquired and post processed in a separate workstation under concurrent attending physician supervision. Selected images were sent to PACS. Additionally, multiple magnified oblique views of the head were obtained centered on the left MCA bifurcation. The catheter was then placed into the left subclavian artery. Frontal and lateral angiograms of the head were obtained. The catheter was subsequently retracted into the right subclavian artery. Frontal and lateral angiograms of the neck were obtained. Using roadmap guidance, the catheter  was advanced into the right vertebral artery. Frontal and lateral angiograms of the head were obtained. The catheter was subsequently withdrawn. An inflatable band was placed and inflated over the right wrist access site. The vascular sheath was withdrawn and the band was slowly deflated until brisk flow was noted through the arteriotomy site. At this point, the band was reinflated with additional 2 cc of air to obtain patent hemostasis. FINDINGS: Right radial artery ultrasound and right radial artery angiogram: The caliber of the distal right radial artery is appropriate for angiogram access. The right radial artery and the right ulnar artery have normal course and caliber. No significant anatomical variants noted. Right CCA angiograms: Calcified atherosclerotic changes of the right carotid bifurcation without hemodynamically significant. Increased tortuosity of the distal cervical segment of the right  ICA. Right ICA angiograms: Luminal irregularity of the right carotid siphon without hemodynamically significant stenosis. An infundibular dilatation is noted at the origin of a small posterior communicating artery. There is brisk vascular contrast filling of the right ACA and MCA vascular trees. Flash filling of the left ACA seen via prominent anterior communicating artery. No aneurysms or abnormally high-flow, early draining veins are seen. No regions of abnormal hypervascularity are noted. The visualized dural sinuses are patent. Left CCA angiograms: Calcified atherosclerotic changes of the left carotid bifurcation without stenosis. Left ICA angiograms: Luminal irregularity of the cavernous segment of the left ICA with mild stenosis at the horizontal portion of the cavernous segment and ectasia of the anterior genu. A saccular aneurysm is seen in the communicating segment measuring 5.3 x 2.7 mm. The posterior communicating artery originates from the neck of the aneurysm. There is brisk vascular contrast filling of  the left ACA and MCA vascular trees noting a duplicated left U2/PNT segment. A wide neck saccular aneurysm is seen at the bifurcation of the proximal M1/MCA, measuring approximately 8 x 5 mm. Luminal caliber is smooth and tapering. No abnormally high-flow, early draining veins are seen. No regions of abnormal hypervascularity are noted. The visualized dural sinuses are patent. Left vertebral artery angiograms: The dominant left vertebral artery, basilar artery, and bilateral posterior cerebral arteries are unremarkable. Luminal caliber is smooth and tapering. No aneurysms or abnormally high-flow, early draining veins are seen. No regions of abnormal hypervascularity are noted. The visualized dural sinuses are patent. Right subclavian angiograms: The right subclavian artery, cervical right vertebral artery and visualized branches of the thyrocervical trunk are unremarkable. Right vertebral artery angiograms: The non dominant right vertebral artery, basilar artery, and bilateral posterior cerebral arteries are unremarkable. Luminal caliber is smooth and tapering. No aneurysms or abnormally high-flow, early draining veins are seen. No regions of abnormal hypervascularity are noted. The visualized dural sinuses are patent. PROCEDURE: No intervention performed. IMPRESSION: 1. 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 2. 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 3. Luminal irregularity of the bilateral carotid siphons suggestive of intracranial atherosclerotic disease without hemodynamically significant stenosis. 4. Calcified atherosclerotic changes of the bilateral carotid bifurcation without hemodynamically significant stenosis. PLAN: Patient will be brought for office consultation to discuss brain aneurysms management. Electronically Signed   By: Pedro Earls M.D.   On: 09/22/2020 15:45   IR CT [facility]  Result Date:  10/04/2020 INDICATION: Timothy Reeves. is a 65 year old male with past medical history significant for coronary artery disease, discoid lupus, erectile dysfunction, gastroesophageal reflux disease with esophagitis, generalized anxiety disorder, hypertension, psoriatic arthritis, hypercholesterolemia and remote history of tobacco use having quit 12 years ago (37 pack years). 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 His family history is significant for mom passing away due to a ruptured brain aneurysm. He underwent a diagnostic cerebral angiogram September 22, 2020 which confirmed the presence of both aneurysms. She comes to our service today for elective treatment of these aneurysm. In anticipation to today's procedure, the patient was started on dual anti-platelet therapy with aspirin and ticagrelor. EXAM: ULTRASOUND-GUIDED VASCULAR ACCESS DIAGNOSTIC CEREBRAL ANGIOGRAM 3D ROTATIONAL ANGIOGRAM ENDOVASCULAR EMBOLIZATION OF LEFT MCA BIFURCATION ANEURYSM ENDOVASCULAR EMBOLIZATION OF LEFT ICA ANEURYSM FLAT PANEL  HEAD CT COMPARISON:  Cerebral angiogram September 22, 2020. MEDICATIONS: A total of a 1000 mg of heparin intravascular were administered. ANESTHESIA/SEDATION: The procedure was performed under general anesthesia. CONTRAST:  50 mL of Omnipaque 350 milligram/mL FLUOROSCOPY TIME:  Fluoroscopy Time: 99 minutes 30 seconds (DAP 3,582 mGy). COMPLICATIONS: None immediate. TECHNIQUE: Informed written consent was obtained from the patient after a thorough discussion of the procedural risks, benefits and alternatives. All questions were addressed. Maximal Sterile Barrier Technique was utilized including caps, mask, sterile gowns, sterile gloves, sterile drape, hand hygiene and skin antiseptic. A timeout was performed prior to the initiation of the procedure. Using the  modified Seldinger technique and a micropuncture kit, access was gained to the right radial artery at the wrist and a 7 French sheath was placed. Slow intra arterial infusion of 5,000 IU heparin, 5 mg Verapamil and 200 mcg nitroglycerin diluted in patient's own blood was performed. No significant fluctuation in patient's blood pressure seen. Then, a right radial artery angiogram was obtained via sheath side port. Next, a 5 [place] JB 2 glide catheter was navigated over a 0.035" Terumo Glidewire into the right subclavian artery under fluoroscopic guidance. The catheter was then advanced into the left common carotid artery. Frontal and lateral angiograms of the neck were obtained. Under biplane roadmap, the catheter was advanced into the left internal carotid artery. Frontal and lateral angiograms of the head were obtained. 3D rotational angiograms were acquired and post processed in a separate workstation under concurrent attending physician supervision. Selected images were sent to PACS. Additionally, magnified right anterior oblique and magnified lateral views of the head were obtained, centered on the intracranial left MCA. FINDINGS: 1. Normal brachial artery branching pattern seen. No significant anatomical variation. The right radial artery caliber is adequate for vascular access. 2. Saccular aneurysm is seen in the communicating segment measuring 5.3 x 2.7 mm. The posterior communicating artery originates from the neck of the aneurysm. 3. Duplicated left K5/GYB segment. 4. Wide neck saccular aneurysm is seen at the bifurcation of the proximal M1/MCA, measuring approximately 8 x 5 mm. PROCEDURE: The 5 [place] JB 2 catheter was exchanged over the wire and under biplane roadmap for a 7 [place] Rist catheter which was placed in the distal cervical segment of the left ICA. Magnified right anterior oblique and lateral views of the head were obtained in the working projections. Under biplane roadmap, triaxial navigation  of a [place] distal access catheter over a via 17 microcatheter and an Aristotle 14 microguidewire into the distal cervical segment of the left ICA. The [place] catheter was not navigated more distally due to catheter length limitation related to tortuosity. The microcatheter was subsequently navigated over the microwire into the proximal right M1/MCA division and subsequently into the aneurysm pouch. Then, a 7 x 4 mm web device was deployed into the aneurysm sac. Magnified frontal and lateral angiograms of the head were obtained in the working projection confirming adequate positioning of the web device. The device was subsequently detached. The via 17 microcatheter was subsequently withdrawn. Follow-up left ICA angiograms with magnified frontal and lateral views of the head confirmed adequate positioning of the web device with adequate aneurysm coverage and preservation of parent arteries. Under biplane roadmap, a phenom 27 microcatheter was navigated over an Aristotle 14 microguidewire into an M2 branch of the distal left MCA. Then, a 4 x 12 mm pipeline flex device was advanced and deployed into the intracranial left ICA supraclinoid segment. Although initial positioning of  the device was adequately covering the left ICA communicating segment aneurysm neck, upon deployment of the proximal of the device, significant foreshortening was noticed with partial uncovering of the left ICA aneurysm neck. Under biplane roadmap, a phenom 27 microcatheter was navigated over an Aristotle 14 microguidewire into an M2 branch of the distal left MCA. Then, a 3.75 X 12 mm pipeline flex device was advanced and deployed into the intracranial left ICA supraclinoid segment with adequate coverage of the aneurysm neck. Left ICA angiograms confirmed positioning of the device with adequate device wall apposition. The device construct does not cover the origin of either left MCA/M1 segments. Flat panel CT of the head was obtained  and post processed in a separate workstation with concurrent attending physician supervision. Selected images were sent to PACS. No evidence of hemorrhagic complication. Left internal carotid artery angiograms with frontal and lateral full views of head shows brisk contrast opacification of the left anterior circulation with no evidence of thromboembolic. Flow stagnation is noted in the left MCA bifurcation aneurysm and left ICA communicating segment aneurysm. The catheter construct was subsequently withdrawn. An inflatable band was placed and inflated over the right wrist access site. The vascular sheath was withdrawn and the band was slowly deflated until brisk flow was noted through the arteriotomy site. At this point, the band was reinflated with additional 2 cc of air to obtain patent hemostasis. IMPRESSION: 1. Successful and uncomplicated endovascular embolization of a left MCA wide neck bifurcation aneurysm with use of a WEB device. 2. Successful and uncomplicated endovascular embolization of a left ICA communicating segment aneurysm with use of 2 telescoping pipeline flex flow diverters. PLAN: Continue on dual anti-platelet therapy until next follow-up angiogram, at six-month. Electronically Signed   By: Pedro Earls M.D.   On: 10/04/2020 09:43   IR US Guide Vasc Access Right  Result Date: 10/04/2020 INDICATION: Sterlin Knightly. is a 65 year old male with past medical history significant for coronary artery disease, discoid lupus, erectile dysfunction, gastroesophageal reflux disease with esophagitis, generalized anxiety disorder, hypertension, psoriatic arthritis, hypercholesterolemia and remote history of tobacco use having quit 12 years ago (37 pack years). 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 His family history is significant for  mom passing away due to a ruptured brain aneurysm. He underwent a diagnostic cerebral angiogram September 22, 2020 which confirmed the presence of both aneurysms. She comes to our service today for elective treatment of these aneurysm. In anticipation to today's procedure, the patient was started on dual anti-platelet therapy with aspirin and ticagrelor. EXAM: ULTRASOUND-GUIDED VASCULAR ACCESS DIAGNOSTIC CEREBRAL ANGIOGRAM 3D ROTATIONAL ANGIOGRAM ENDOVASCULAR EMBOLIZATION OF LEFT MCA BIFURCATION ANEURYSM ENDOVASCULAR EMBOLIZATION OF LEFT ICA ANEURYSM FLAT PANEL HEAD CT COMPARISON:  Cerebral angiogram September 22, 2020. MEDICATIONS: A total of a 1000 mg of heparin intravascular were administered. ANESTHESIA/SEDATION: The procedure was performed under general anesthesia. CONTRAST:  50 mL of Omnipaque 350 milligram/mL FLUOROSCOPY TIME:  Fluoroscopy Time: 99 minutes 30 seconds (DAP 3,582 mGy). COMPLICATIONS: None immediate. TECHNIQUE: Informed written consent was obtained from the patient after a thorough discussion of the procedural risks, benefits and alternatives. All questions were addressed. Maximal Sterile Barrier Technique was utilized including caps, mask, sterile gowns, sterile gloves, sterile drape, hand hygiene and skin antiseptic. A timeout was performed prior to the initiation of the procedure. Using the  modified Seldinger technique and a micropuncture kit, access was gained to the right radial artery at the wrist and a 7 French sheath was placed. Slow intra arterial infusion of 5,000 IU heparin, 5 mg Verapamil and 200 mcg nitroglycerin diluted in patient's own blood was performed. No significant fluctuation in patient's blood pressure seen. Then, a right radial artery angiogram was obtained via sheath side port. Next, a 5 [place] JB 2 glide catheter was navigated over a 0.035" Terumo Glidewire into the right subclavian artery under fluoroscopic guidance. The catheter was then advanced into the left common  carotid artery. Frontal and lateral angiograms of the neck were obtained. Under biplane roadmap, the catheter was advanced into the left internal carotid artery. Frontal and lateral angiograms of the head were obtained. 3D rotational angiograms were acquired and post processed in a separate workstation under concurrent attending physician supervision. Selected images were sent to PACS. Additionally, magnified right anterior oblique and magnified lateral views of the head were obtained, centered on the intracranial left MCA. FINDINGS: 1. Normal brachial artery branching pattern seen. No significant anatomical variation. The right radial artery caliber is adequate for vascular access. 2. Saccular aneurysm is seen in the communicating segment measuring 5.3 x 2.7 mm. The posterior communicating artery originates from the neck of the aneurysm. 3. Duplicated left Q0/HKV segment. 4. Wide neck saccular aneurysm is seen at the bifurcation of the proximal M1/MCA, measuring approximately 8 x 5 mm. PROCEDURE: The 5 [place] JB 2 catheter was exchanged over the wire and under biplane roadmap for a 7 [place] Rist catheter which was placed in the distal cervical segment of the left ICA. Magnified right anterior oblique and lateral views of the head were obtained in the working projections. Under biplane roadmap, triaxial navigation of a [place] distal access catheter over a via 17 microcatheter and an Aristotle 14 microguidewire into the distal cervical segment of the left ICA. The [place] catheter was not navigated more distally due to catheter length limitation related to tortuosity. The microcatheter was subsequently navigated over the microwire into the proximal right M1/MCA division and subsequently into the aneurysm pouch. Then, a 7 x 4 mm web device was deployed into the aneurysm sac. Magnified frontal and lateral angiograms of the head were obtained in the working projection confirming adequate positioning of the web  device. The device was subsequently detached. The via 17 microcatheter was subsequently withdrawn. Follow-up left ICA angiograms with magnified frontal and lateral views of the head confirmed adequate positioning of the web device with adequate aneurysm coverage and preservation of parent arteries. Under biplane roadmap, a phenom 27 microcatheter was navigated over an Aristotle 14 microguidewire into an M2 branch of the distal left MCA. Then, a 4 x 12 mm pipeline flex device was advanced and deployed into the intracranial left ICA supraclinoid segment. Although initial positioning of the device was adequately covering the left ICA communicating segment aneurysm neck, upon deployment of the proximal of the device, significant foreshortening was noticed with partial uncovering of the left ICA aneurysm neck. Under biplane roadmap, a phenom 27 microcatheter was navigated over an Aristotle 14 microguidewire into an M2 branch of the distal left MCA. Then, a 3.75 X 12 mm pipeline flex device was advanced and deployed into the intracranial left ICA supraclinoid segment with adequate coverage of the aneurysm neck. Left ICA angiograms confirmed positioning of the device with adequate device wall apposition. The device construct does not cover the origin of either left MCA/M1 segments.  Flat panel CT of the head was obtained and post processed in a separate workstation with concurrent attending physician supervision. Selected images were sent to PACS. No evidence of hemorrhagic complication. Left internal carotid artery angiograms with frontal and lateral full views of head shows brisk contrast opacification of the left anterior circulation with no evidence of thromboembolic. Flow stagnation is noted in the left MCA bifurcation aneurysm and left ICA communicating segment aneurysm. The catheter construct was subsequently withdrawn. An inflatable band was placed and inflated over the right wrist access site. The vascular sheath  was withdrawn and the band was slowly deflated until brisk flow was noted through the arteriotomy site. At this point, the band was reinflated with additional 2 cc of air to obtain patent hemostasis. IMPRESSION: 1. Successful and uncomplicated endovascular embolization of a left MCA wide neck bifurcation aneurysm with use of a WEB device. 2. Successful and uncomplicated endovascular embolization of a left ICA communicating segment aneurysm with use of 2 telescoping pipeline flex flow diverters. PLAN: Continue on dual anti-platelet therapy until next follow-up angiogram, at six-month. Electronically Signed   By: Pedro Earls M.D.   On: 10/04/2020 09:43   IR US Guide Vasc Access Right  Result Date: 09/22/2020 INDICATION: Timothy Reeves. is a 65 y.o. male with past medical history significant for coronary artery disease, discoid lupus, erectile dysfunction, gastroesophageal reflux disease with esophagitis, generalized anxiety disorder, hypertension, psoriatic arthritis, hypercholesterolemia and remote history of tobacco use recently found to have a 32m left ICA aneurysm and 979mleft MCA bifurcation aneurysm. He comes to our service today for a diagnostic cerebral angiogram better evaluate his aneurysms. EXAM: ULTRASOUND-GUIDED VASCULAR ACCESS DIAGNOSTIC CEREBRAL ANGIOGRAM 3D ROTATIONAL ANGIOGRAM COMPARISON:  MR angiogram September 14, 2020. MEDICATIONS: No antibiotics prescribed. ANESTHESIA/SEDATION: Versed 2 mg IV; Fentanyl 100 mcg IV Moderate Sedation Time:  67 minutes The patient was continuously monitored during the procedure by the interventional radiology nurse under my direct supervision. CONTRAST:  24 mL of Omnipaque 350 milligram/mL; 88 mL of Omnipaque 300 milligram/mL FLUOROSCOPY TIME:  Fluoroscopy Time: 14 minutes (700 mGy). COMPLICATIONS: None immediate. TECHNIQUE: Informed written consent was obtained from the patient after a thorough discussion of the procedural risks, benefits  and alternatives. All questions were addressed. Maximal Sterile Barrier Technique was utilized including caps, mask, sterile gowns, sterile gloves, sterile drape, hand hygiene and skin antiseptic. A timeout was performed prior to the initiation of the procedure. Using the modified Seldinger technique and a micropuncture kit, access was gained to the right radial artery at the wrist and a 5 French sheath was placed. Real-time ultrasound guidance was utilized for vascular access including the acquisition of a permanent ultrasound image documenting patency of the accessed vessel. Slow intra arterial infusion of 5,000 IU heparin, 5 mg Verapamil and 200 mcg nitroglycerin diluted in patient's own blood was performed. No significant fluctuation in patient's blood pressure seen. Then, a right radial artery angiogram was obtained via sheath side port. Next, a 5 [place] 2 glide catheter was navigated over a 0.035" Terumo Glidewire into the right subclavian artery under fluoroscopic guidance. Under fluoroscopy, the catheter was placed into the right common carotid artery. Frontal and lateral angiograms of the neck were obtained. Using biplane roadmap, the catheter was the catheter was advanced into the right internal carotid artery. Frontal and lateral angiograms were obtained followed by magnified right anterior oblique and magnified lateral views of the. Next, the catheter was placed into the left common carotid artery.  Frontal lateral angiograms of the neck were obtained. Under biplane roadmap, the catheter was advanced into the left internal carotid artery. Frontal and lateral angiograms of the head were obtained. 3D rotational angiograms were acquired and post processed in a separate workstation under concurrent attending physician supervision. Selected images were sent to PACS. Additionally, multiple magnified oblique views of the head were obtained centered on the left MCA bifurcation. The catheter was then  placed into the left subclavian artery. Frontal and lateral angiograms of the head were obtained. The catheter was subsequently retracted into the right subclavian artery. Frontal and lateral angiograms of the neck were obtained. Using roadmap guidance, the catheter was advanced into the right vertebral artery. Frontal and lateral angiograms of the head were obtained. The catheter was subsequently withdrawn. An inflatable band was placed and inflated over the right wrist access site. The vascular sheath was withdrawn and the band was slowly deflated until brisk flow was noted through the arteriotomy site. At this point, the band was reinflated with additional 2 cc of air to obtain patent hemostasis. FINDINGS: Right radial artery ultrasound and right radial artery angiogram: The caliber of the distal right radial artery is appropriate for angiogram access. The right radial artery and the right ulnar artery have normal course and caliber. No significant anatomical variants noted. Right CCA angiograms: Calcified atherosclerotic changes of the right carotid bifurcation without hemodynamically significant. Increased tortuosity of the distal cervical segment of the right ICA. Right ICA angiograms: Luminal irregularity of the right carotid siphon without hemodynamically significant stenosis. An infundibular dilatation is noted at the origin of a small posterior communicating artery. There is brisk vascular contrast filling of the right ACA and MCA vascular trees. Flash filling of the left ACA seen via prominent anterior communicating artery. No aneurysms or abnormally high-flow, early draining veins are seen. No regions of abnormal hypervascularity are noted. The visualized dural sinuses are patent. Left CCA angiograms: Calcified atherosclerotic changes of the left carotid bifurcation without stenosis. Left ICA angiograms: Luminal irregularity of the cavernous segment of the left ICA with mild stenosis at the horizontal  portion of the cavernous segment and ectasia of the anterior genu. A saccular aneurysm is seen in the communicating segment measuring 5.3 x 2.7 mm. The posterior communicating artery originates from the neck of the aneurysm. There is brisk vascular contrast filling of the left ACA and MCA vascular trees noting a duplicated left O2/DXA segment. A wide neck saccular aneurysm is seen at the bifurcation of the proximal M1/MCA, measuring approximately 8 x 5 mm. Luminal caliber is smooth and tapering. No abnormally high-flow, early draining veins are seen. No regions of abnormal hypervascularity are noted. The visualized dural sinuses are patent. Left vertebral artery angiograms: The dominant left vertebral artery, basilar artery, and bilateral posterior cerebral arteries are unremarkable. Luminal caliber is smooth and tapering. No aneurysms or abnormally high-flow, early draining veins are seen. No regions of abnormal hypervascularity are noted. The visualized dural sinuses are patent. Right subclavian angiograms: The right subclavian artery, cervical right vertebral artery and visualized branches of the thyrocervical trunk are unremarkable. Right vertebral artery angiograms: The non dominant right vertebral artery, basilar artery, and bilateral posterior cerebral arteries are unremarkable. Luminal caliber is smooth and tapering. No aneurysms or abnormally high-flow, early draining veins are seen. No regions of abnormal hypervascularity are noted. The visualized dural sinuses are patent. PROCEDURE: No intervention performed. IMPRESSION: 1. 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 2. 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 3. Luminal irregularity of the bilateral carotid siphons suggestive of intracranial atherosclerotic disease without hemodynamically significant stenosis. 4. Calcified atherosclerotic changes of the bilateral carotid  bifurcation without hemodynamically significant stenosis. PLAN: Patient will be brought for office consultation to discuss brain aneurysms management. Electronically Signed   By: Pedro Earls M.D.   On: 09/22/2020 15:45   MR BRAIN/IAC W WO CONTRAST  Addendum Date: 09/15/2020   ADDENDUM REPORT: 09/15/2020 10:07 ADDENDUM: MRA impressions 1 and 2 called by telephone on 09/15/2020 at 10:07 am to provider [facility] , who verbally acknowledged these results. Electronically Signed   By: Kellie Simmering D.O.   On: 09/15/2020 10:07   Result Date: 09/15/2020 CLINICAL DATA:  Dizziness. Additional history provided by scanning technologist: Patient reports dizziness when looking up, ringing in both ears, symptoms for years. EXAM: MRI HEAD WITHOUT AND WITH CONTRAST MRA HEAD WITHOUT CONTRAST TECHNIQUE: Multiplanar, multi-echo pulse sequences of the brain and surrounding structures were acquired without and with intravenous contrast. Angiographic images of the Circle of Willis were acquired using MRA technique without intravenous contrast. CONTRAST:  25m GADAVIST GADOBUTROL 1 MMOL/ML IV SOLN COMPARISON:  No pertinent prior exam. FINDINGS: MRI HEAD FINDINGS Brain: Mild generalized cerebral atrophy. Mild multifocal T2/FLAIR hyperintensity within the cerebral white matter, nonspecific but compatible with chronic small vessel ischemic disease. Small chronic lacunar infarcts within the bilateral basal ganglia. 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 No evidence of an intracranial mass. Specifically, no cerebellopontine angle or internal auditory canal mass is demonstrated. Unremarkable appearance of the seventh and eighth cranial nerves bilaterally. There is no acute infarct. No chronic intracranial blood products. No extra-axial fluid collection. No midline shift. Incidentally noted cavum septum pellucidum and cavum vergae. Left M1/M2 MCA aneurysm as described below. Otherwise, no  pathologic intracranial enhancement. Vascular: Maintained flow voids within the proximal large arterial vessels. Please see concurrently performed MRA for a description of a left M1/M2 MCA aneurysm. Skull and upper cervical spine: No focal suspicious marrow lesion. Sinuses/Orbits: Visualized orbits show no acute finding. Mild mucosal thickening within the bilateral ethmoid air cells. Mild mucosal thickening within the right maxillary sinus. Moderate mucosal thickening within the left maxillary sinus. Other: Right mastoid effusion. MRA HEAD FINDINGS Anterior circulation: The intracranial internal carotid arteries are patent. Duplicated left middle cerebral artery. The M1 middle cerebral arteries are patent. No M2 proximal branch occlusion or high-grade proximal stenosis is identified. The anterior cerebral arteries are patent. 5 mm inferolaterally projecting aneurysm arising from the supraclinoid left ICA (for instance as seen on series 5, image 79). 9 x 6 mm saccular aneurysm arising from the left middle cerebral artery at the M1/M2 junction (series 5, image 98). Posterior circulation: The intracranial vertebral arteries are patent. Left vertebral artery dominant. The basilar artery is patent. The posterior cerebral arteries are patent. Posterior communicating arteries are hypoplastic or absent bilaterally. Anatomic variants: As described. MRA head impressions #1 and #2 will be called to the ordering clinician or representative by the Radiologist Assistant, and communication documented in the PACS or [employer] IMPRESSION: MRI brain: 1. No evidence of acute intracranial abnormality. 2. No cerebellopontine angle or internal auditory canal mass. 3. Mild chronic small-vessel ischemic changes within the cerebral white matter. 4. Small chronic lacunar infarcts within the bilateral basal ganglia. 5. 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 6. Mild generalized cerebral atrophy. 7.  Paranasal sinus disease, as described.  8. Right mastoid effusion. MRA head: 1. Duplicated left middle cerebral artery. There is a 9 x 6 mm saccular aneurysm arising from the left middle cerebral artery at the M1/M2 junction. Neuro-interventional consultation is recommended. 2. 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 Neuro-interventional consultation is recommended. 3. No intracranial large vessel occlusion or proximal high-grade arterial stenosis. Electronically Signed: By: Kellie Simmering D.O. On: 09/15/2020 09:12   IR ANGIO INTRA EXTRACRAN SEL INTERNAL CAROTID UNI L MOD SED  Result Date: 10/04/2020 INDICATION: Kinsey Cowsert. is a 65 year old male with past medical history significant for coronary artery disease, discoid lupus, erectile dysfunction, gastroesophageal reflux disease with esophagitis, generalized anxiety disorder, hypertension, psoriatic arthritis, hypercholesterolemia and remote history of tobacco use having quit 12 years ago (37 pack years). 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 His family history is significant for mom passing away due to a ruptured brain aneurysm. He underwent a diagnostic cerebral angiogram September 22, 2020 which confirmed the presence of both aneurysms. She comes to our service today for elective treatment of these aneurysm. In anticipation to today's procedure, the patient was started on dual anti-platelet therapy with aspirin and ticagrelor. EXAM: ULTRASOUND-GUIDED VASCULAR ACCESS DIAGNOSTIC CEREBRAL ANGIOGRAM 3D ROTATIONAL ANGIOGRAM ENDOVASCULAR EMBOLIZATION OF LEFT MCA BIFURCATION ANEURYSM ENDOVASCULAR EMBOLIZATION OF LEFT ICA ANEURYSM FLAT PANEL HEAD CT COMPARISON:  Cerebral angiogram September 22, 2020. MEDICATIONS: A total of a 1000 mg of heparin intravascular were  administered. ANESTHESIA/SEDATION: The procedure was performed under general anesthesia. CONTRAST:  50 mL of Omnipaque 350 milligram/mL FLUOROSCOPY TIME:  Fluoroscopy Time: 99 minutes 30 seconds (DAP 3,582 mGy). COMPLICATIONS: None immediate. TECHNIQUE: Informed written consent was obtained from the patient after a thorough discussion of the procedural risks, benefits and alternatives. All questions were addressed. Maximal Sterile Barrier Technique was utilized including caps, mask, sterile gowns, sterile gloves, sterile drape, hand hygiene and skin antiseptic. A timeout was performed prior to the initiation of the procedure. Using the modified Seldinger technique and a micropuncture kit, access was gained to the right radial artery at the wrist and a 7 French sheath was placed. Slow intra arterial infusion of 5,000 IU heparin, 5 mg Verapamil and 200 mcg nitroglycerin diluted in patient's own blood was performed. No significant fluctuation in patient's blood pressure seen. Then, a right radial artery angiogram was obtained via sheath side port. Next, a 5 [place] JB 2 glide catheter was navigated over a 0.035" Terumo Glidewire into the right subclavian artery under fluoroscopic guidance. The catheter was then advanced into the left common carotid artery. Frontal and lateral angiograms of the neck were obtained. Under biplane roadmap, the catheter was advanced into the left internal carotid artery. Frontal and lateral angiograms of the head were obtained. 3D rotational angiograms were acquired and post processed in a separate workstation under concurrent attending physician supervision. Selected images were sent to PACS. Additionally, magnified right anterior oblique and magnified lateral views of the head were obtained, centered on the intracranial left MCA. FINDINGS: 1. Normal brachial artery branching pattern seen. No significant anatomical variation. The right radial artery caliber is adequate for vascular access.  2. Saccular aneurysm is seen in the communicating segment measuring 5.3 x 2.7 mm. The posterior communicating artery originates from the neck of the aneurysm. 3. Duplicated left B2/IOM segment. 4. Wide neck saccular  aneurysm is seen at the bifurcation of the proximal M1/MCA, measuring approximately 8 x 5 mm. PROCEDURE: The 5 [place] JB 2 catheter was exchanged over the wire and under biplane roadmap for a 7 [place] Rist catheter which was placed in the distal cervical segment of the left ICA. Magnified right anterior oblique and lateral views of the head were obtained in the working projections. Under biplane roadmap, triaxial navigation of a [place] distal access catheter over a via 17 microcatheter and an Aristotle 14 microguidewire into the distal cervical segment of the left ICA. The [place] catheter was not navigated more distally due to catheter length limitation related to tortuosity. The microcatheter was subsequently navigated over the microwire into the proximal right M1/MCA division and subsequently into the aneurysm pouch. Then, a 7 x 4 mm web device was deployed into the aneurysm sac. Magnified frontal and lateral angiograms of the head were obtained in the working projection confirming adequate positioning of the web device. The device was subsequently detached. The via 17 microcatheter was subsequently withdrawn. Follow-up left ICA angiograms with magnified frontal and lateral views of the head confirmed adequate positioning of the web device with adequate aneurysm coverage and preservation of parent arteries. Under biplane roadmap, a phenom 27 microcatheter was navigated over an Aristotle 14 microguidewire into an M2 branch of the distal left MCA. Then, a 4 x 12 mm pipeline flex device was advanced and deployed into the intracranial left ICA supraclinoid segment. Although initial positioning of the device was adequately covering the left ICA communicating segment aneurysm neck, upon deployment  of the proximal of the device, significant foreshortening was noticed with partial uncovering of the left ICA aneurysm neck. Under biplane roadmap, a phenom 27 microcatheter was navigated over an Aristotle 14 microguidewire into an M2 branch of the distal left MCA. Then, a 3.75 X 12 mm pipeline flex device was advanced and deployed into the intracranial left ICA supraclinoid segment with adequate coverage of the aneurysm neck. Left ICA angiograms confirmed positioning of the device with adequate device wall apposition. The device construct does not cover the origin of either left MCA/M1 segments. Flat panel CT of the head was obtained and post processed in a separate workstation with concurrent attending physician supervision. Selected images were sent to PACS. No evidence of hemorrhagic complication. Left internal carotid artery angiograms with frontal and lateral full views of head shows brisk contrast opacification of the left anterior circulation with no evidence of thromboembolic. Flow stagnation is noted in the left MCA bifurcation aneurysm and left ICA communicating segment aneurysm. The catheter construct was subsequently withdrawn. An inflatable band was placed and inflated over the right wrist access site. The vascular sheath was withdrawn and the band was slowly deflated until brisk flow was noted through the arteriotomy site. At this point, the band was reinflated with additional 2 cc of air to obtain patent hemostasis. IMPRESSION: 1. Successful and uncomplicated endovascular embolization of a left MCA wide neck bifurcation aneurysm with use of a WEB device. 2. Successful and uncomplicated endovascular embolization of a left ICA communicating segment aneurysm with use of 2 telescoping pipeline flex flow diverters. PLAN: Continue on dual anti-platelet therapy until next follow-up angiogram, at six-month. Electronically Signed   By: Pedro Earls M.D.   On: 10/04/2020 09:43   IR ANGIO  INTRA EXTRACRAN SEL INTERNAL CAROTID BILAT MOD SED  Result Date: 09/22/2020 INDICATION: Timothy Reeves. is a 65 y.o. 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 He comes to our service today for a diagnostic cerebral angiogram better evaluate his aneurysms. EXAM: ULTRASOUND-GUIDED VASCULAR ACCESS DIAGNOSTIC CEREBRAL ANGIOGRAM 3D ROTATIONAL ANGIOGRAM COMPARISON:  MR angiogram September 14, 2020. MEDICATIONS: No antibiotics prescribed. ANESTHESIA/SEDATION: Versed 2 mg IV; Fentanyl 100 mcg IV Moderate Sedation Time:  67 minutes The patient was continuously monitored during the procedure by the interventional radiology nurse under my direct supervision. CONTRAST:  24 mL of Omnipaque 350 milligram/mL; 88 mL of Omnipaque 300 milligram/mL FLUOROSCOPY TIME:  Fluoroscopy Time: 14 minutes (700 mGy). COMPLICATIONS: None immediate. TECHNIQUE: Informed written consent was obtained from the patient after a thorough discussion of the procedural risks, benefits and alternatives. All questions were addressed. Maximal Sterile Barrier Technique was utilized including caps, mask, sterile gowns, sterile gloves, sterile drape, hand hygiene and skin antiseptic. A timeout was performed prior to the initiation of the procedure. Using the modified Seldinger technique and a micropuncture kit, access was gained to the right radial artery at the wrist and a 5 French sheath was placed. Real-time ultrasound guidance was utilized for vascular access including the acquisition of a permanent ultrasound image documenting patency of the accessed vessel. Slow intra arterial infusion of 5,000 IU heparin, 5 mg Verapamil and 200 mcg  nitroglycerin diluted in patient's own blood was performed. No significant fluctuation in patient's blood pressure seen. Then, a right radial artery angiogram was obtained via sheath side port. Next, a 5 [place] 2 glide catheter was navigated over a 0.035" Terumo Glidewire into the right subclavian artery under fluoroscopic guidance. Under fluoroscopy, the catheter was placed into the right common carotid artery. Frontal and lateral angiograms of the neck were obtained. Using biplane roadmap, the catheter was the catheter was advanced into the right internal carotid artery. Frontal and lateral angiograms were obtained followed by magnified right anterior oblique and magnified lateral views of the. Next, the catheter was placed into the left common carotid artery. Frontal lateral angiograms of the neck were obtained. Under biplane roadmap, the catheter was advanced into the left internal carotid artery. Frontal and lateral angiograms of the head were obtained. 3D rotational angiograms were acquired and post processed in a separate workstation under concurrent attending physician supervision. Selected images were sent to PACS. Additionally, multiple magnified oblique views of the head were obtained centered on the left MCA bifurcation. The catheter was then placed into the left subclavian artery. Frontal and lateral angiograms of the head were obtained. The catheter was subsequently retracted into the right subclavian artery. Frontal and lateral angiograms of the neck were obtained. Using roadmap guidance, the catheter was advanced into the right vertebral artery. Frontal and lateral angiograms of the head were obtained. The catheter was subsequently withdrawn. An inflatable band was placed and inflated over the right wrist access site. The vascular sheath was withdrawn and the band was slowly deflated until brisk flow was noted through the arteriotomy site. At this point, the band was reinflated with  additional 2 cc of air to obtain patent hemostasis. FINDINGS: Right radial artery ultrasound and right radial artery angiogram: The caliber of the distal right radial artery is appropriate for angiogram access. The right radial artery and the right ulnar artery have normal course and caliber. No significant anatomical variants noted. Right CCA  angiograms: Calcified atherosclerotic changes of the right carotid bifurcation without hemodynamically significant. Increased tortuosity of the distal cervical segment of the right ICA. Right ICA angiograms: Luminal irregularity of the right carotid siphon without hemodynamically significant stenosis. An infundibular dilatation is noted at the origin of a small posterior communicating artery. There is brisk vascular contrast filling of the right ACA and MCA vascular trees. Flash filling of the left ACA seen via prominent anterior communicating artery. No aneurysms or abnormally high-flow, early draining veins are seen. No regions of abnormal hypervascularity are noted. The visualized dural sinuses are patent. Left CCA angiograms: Calcified atherosclerotic changes of the left carotid bifurcation without stenosis. Left ICA angiograms: Luminal irregularity of the cavernous segment of the left ICA with mild stenosis at the horizontal portion of the cavernous segment and ectasia of the anterior genu. A saccular aneurysm is seen in the communicating segment measuring 5.3 x 2.7 mm. The posterior communicating artery originates from the neck of the aneurysm. There is brisk vascular contrast filling of the left ACA and MCA vascular trees noting a duplicated left H5/KTG segment. A wide neck saccular aneurysm is seen at the bifurcation of the proximal M1/MCA, measuring approximately 8 x 5 mm. Luminal caliber is smooth and tapering. No abnormally high-flow, early draining veins are seen. No regions of abnormal hypervascularity are noted. The visualized dural sinuses are patent. Left  vertebral artery angiograms: The dominant left vertebral artery, basilar artery, and bilateral posterior cerebral arteries are unremarkable. Luminal caliber is smooth and tapering. No aneurysms or abnormally high-flow, early draining veins are seen. No regions of abnormal hypervascularity are noted. The visualized dural sinuses are patent. Right subclavian angiograms: The right subclavian artery, cervical right vertebral artery and visualized branches of the thyrocervical trunk are unremarkable. Right vertebral artery angiograms: The non dominant right vertebral artery, basilar artery, and bilateral posterior cerebral arteries are unremarkable. Luminal caliber is smooth and tapering. No aneurysms or abnormally high-flow, early draining veins are seen. No regions of abnormal hypervascularity are noted. The visualized dural sinuses are patent. PROCEDURE: No intervention performed. IMPRESSION: 1. 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 2. 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 3. Luminal irregularity of the bilateral carotid siphons suggestive of intracranial atherosclerotic disease without hemodynamically significant stenosis. 4. Calcified atherosclerotic changes of the bilateral carotid bifurcation without hemodynamically significant stenosis. PLAN: Patient will be brought for office consultation to discuss brain aneurysms management. Electronically Signed   By: Pedro Earls M.D.   On: 09/22/2020 15:45   IR ANGIO VERTEBRAL SEL SUBCLAVIAN INNOMINATE UNI L MOD SED  Result Date: 09/22/2020 INDICATION: Timothy Reeves. is a 65 y.o. male with past medical history significant for coronary artery disease, discoid lupus, erectile dysfunction, gastroesophageal reflux disease with esophagitis, generalized anxiety disorder, hypertension, psoriatic arthritis, hypercholesterolemia and remote history of tobacco use recently  found to have a 71m left ICA aneurysm and 931mleft MCA bifurcation aneurysm. He comes to our service today for a diagnostic cerebral angiogram better evaluate his aneurysms. EXAM: ULTRASOUND-GUIDED VASCULAR ACCESS DIAGNOSTIC CEREBRAL ANGIOGRAM 3D ROTATIONAL ANGIOGRAM COMPARISON:  MR angiogram September 14, 2020. MEDICATIONS: No antibiotics prescribed. ANESTHESIA/SEDATION: Versed 2 mg IV; Fentanyl 100 mcg IV Moderate Sedation Time:  67 minutes The patient was continuously monitored during the procedure by the interventional radiology nurse under my direct supervision. CONTRAST:  24 mL of Omnipaque 350 milligram/mL; 88 mL of Omnipaque 300 milligram/mL FLUOROSCOPY  TIME:  Fluoroscopy Time: 14 minutes (700 mGy). COMPLICATIONS: None immediate. TECHNIQUE: Informed written consent was obtained from the patient after a thorough discussion of the procedural risks, benefits and alternatives. All questions were addressed. Maximal Sterile Barrier Technique was utilized including caps, mask, sterile gowns, sterile gloves, sterile drape, hand hygiene and skin antiseptic. A timeout was performed prior to the initiation of the procedure. Using the modified Seldinger technique and a micropuncture kit, access was gained to the right radial artery at the wrist and a 5 French sheath was placed. Real-time ultrasound guidance was utilized for vascular access including the acquisition of a permanent ultrasound image documenting patency of the accessed vessel. Slow intra arterial infusion of 5,000 IU heparin, 5 mg Verapamil and 200 mcg nitroglycerin diluted in patient's own blood was performed. No significant fluctuation in patient's blood pressure seen. Then, a right radial artery angiogram was obtained via sheath side port. Next, a 5 [place] Simmons 2 glide catheter was navigated over a 0.035" Terumo Glidewire into the right subclavian artery under fluoroscopic guidance. Under fluoroscopy, the catheter was placed into the right common  carotid artery. Frontal and lateral angiograms of the neck were obtained. Using biplane roadmap, the catheter was the catheter was advanced into the right internal carotid artery. Frontal and lateral angiograms were obtained followed by magnified right anterior oblique and magnified lateral views of the. Next, the catheter was placed into the left common carotid artery. Frontal lateral angiograms of the neck were obtained. Under biplane roadmap, the catheter was advanced into the left internal carotid artery. Frontal and lateral angiograms of the head were obtained. 3D rotational angiograms were acquired and post processed in a separate workstation under concurrent attending physician supervision. Selected images were sent to PACS. Additionally, multiple magnified oblique views of the head were obtained centered on the left MCA bifurcation. The catheter was then placed into the left subclavian artery. Frontal and lateral angiograms of the head were obtained. The catheter was subsequently retracted into the right subclavian artery. Frontal and lateral angiograms of the neck were obtained. Using roadmap guidance, the catheter was advanced into the right vertebral artery. Frontal and lateral angiograms of the head were obtained. The catheter was subsequently withdrawn. An inflatable band was placed and inflated over the right wrist access site. The vascular sheath was withdrawn and the band was slowly deflated until brisk flow was noted through the arteriotomy site. At this point, the band was reinflated with additional 2 cc of air to obtain patent hemostasis. FINDINGS: Right radial artery ultrasound and right radial artery angiogram: The caliber of the distal right radial artery is appropriate for angiogram access. The right radial artery and the right ulnar artery have normal course and caliber. No significant anatomical variants noted. Right CCA angiograms: Calcified atherosclerotic changes of the right carotid  bifurcation without hemodynamically significant. Increased tortuosity of the distal cervical segment of the right ICA. Right ICA angiograms: Luminal irregularity of the right carotid siphon without hemodynamically significant stenosis. An infundibular dilatation is noted at the origin of a small posterior communicating artery. There is brisk vascular contrast filling of the right ACA and MCA vascular trees. Flash filling of the left ACA seen via prominent anterior communicating artery. No aneurysms or abnormally high-flow, early draining veins are seen. No regions of abnormal hypervascularity are noted. The visualized dural sinuses are patent. Left CCA angiograms: Calcified atherosclerotic changes of the left carotid bifurcation without stenosis. Left ICA angiograms: Luminal irregularity of the cavernous segment of the left  ICA with mild stenosis at the horizontal portion of the cavernous segment and ectasia of the anterior genu. A saccular aneurysm is seen in the communicating segment measuring 5.3 x 2.7 mm. The posterior communicating artery originates from the neck of the aneurysm. There is brisk vascular contrast filling of the left ACA and MCA vascular trees noting a duplicated left Y7/WGN segment. A wide neck saccular aneurysm is seen at the bifurcation of the proximal M1/MCA, measuring approximately 8 x 5 mm. Luminal caliber is smooth and tapering. No abnormally high-flow, early draining veins are seen. No regions of abnormal hypervascularity are noted. The visualized dural sinuses are patent. Left vertebral artery angiograms: The dominant left vertebral artery, basilar artery, and bilateral posterior cerebral arteries are unremarkable. Luminal caliber is smooth and tapering. No aneurysms or abnormally high-flow, early draining veins are seen. No regions of abnormal hypervascularity are noted. The visualized dural sinuses are patent. Right subclavian angiograms: The right subclavian artery, cervical right  vertebral artery and visualized branches of the thyrocervical trunk are unremarkable. Right vertebral artery angiograms: The non dominant right vertebral artery, basilar artery, and bilateral posterior cerebral arteries are unremarkable. Luminal caliber is smooth and tapering. No aneurysms or abnormally high-flow, early draining veins are seen. No regions of abnormal hypervascularity are noted. The visualized dural sinuses are patent. PROCEDURE: No intervention performed. IMPRESSION: 1. 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 2. 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 3. Luminal irregularity of the bilateral carotid siphons suggestive of intracranial atherosclerotic disease without hemodynamically significant stenosis. 4. Calcified atherosclerotic changes of the bilateral carotid bifurcation without hemodynamically significant stenosis. PLAN: Patient will be brought for office consultation to discuss brain aneurysms management. Electronically Signed   By: Pedro Earls M.D.   On: 09/22/2020 15:45   IR ANGIO VERTEBRAL SEL VERTEBRAL UNI R MOD SED  Result Date: 09/22/2020 INDICATION: Benancio Osmundson. is a 65 y.o. male with past medical history significant for coronary artery disease, discoid lupus, erectile dysfunction, gastroesophageal reflux disease with esophagitis, generalized anxiety disorder, hypertension, psoriatic arthritis, hypercholesterolemia and remote history of tobacco use recently found to have a 53m left ICA aneurysm and 967mleft MCA bifurcation aneurysm. He comes to our service today for a diagnostic cerebral angiogram better evaluate his aneurysms. EXAM: ULTRASOUND-GUIDED VASCULAR ACCESS DIAGNOSTIC CEREBRAL ANGIOGRAM 3D ROTATIONAL ANGIOGRAM COMPARISON:  MR angiogram September 14, 2020. MEDICATIONS: No antibiotics prescribed. ANESTHESIA/SEDATION: Versed 2 mg IV; Fentanyl 100 mcg IV Moderate Sedation  Time:  67 minutes The patient was continuously monitored during the procedure by the interventional radiology nurse under my direct supervision. CONTRAST:  24 mL of Omnipaque 350 milligram/mL; 88 mL of Omnipaque 300 milligram/mL FLUOROSCOPY TIME:  Fluoroscopy Time: 14 minutes (700 mGy). COMPLICATIONS: None immediate. TECHNIQUE: Informed written consent was obtained from the patient after a thorough discussion of the procedural risks, benefits and alternatives. All questions were addressed. Maximal Sterile Barrier Technique was utilized including caps, mask, sterile gowns, sterile gloves, sterile drape, hand hygiene and skin antiseptic. A timeout was performed prior to the initiation of the procedure. Using the modified Seldinger technique and a micropuncture kit, access was gained to the right radial artery at the wrist and a 5 French sheath was placed. Real-time ultrasound guidance was utilized for vascular access including the acquisition of a permanent ultrasound image documenting patency of the accessed vessel. Slow intra arterial infusion of 5,000 IU heparin, 5 mg Verapamil  and 200 mcg nitroglycerin diluted in patient's own blood was performed. No significant fluctuation in patient's blood pressure seen. Then, a right radial artery angiogram was obtained via sheath side port. Next, a 5 [place] Simmons 2 glide catheter was navigated over a 0.035" Terumo Glidewire into the right subclavian artery under fluoroscopic guidance. Under fluoroscopy, the catheter was placed into the right common carotid artery. Frontal and lateral angiograms of the neck were obtained. Using biplane roadmap, the catheter was the catheter was advanced into the right internal carotid artery. Frontal and lateral angiograms were obtained followed by magnified right anterior oblique and magnified lateral views of the. Next, the catheter was placed into the left common carotid artery. Frontal lateral angiograms of the neck were obtained. Under  biplane roadmap, the catheter was advanced into the left internal carotid artery. Frontal and lateral angiograms of the head were obtained. 3D rotational angiograms were acquired and post processed in a separate workstation under concurrent attending physician supervision. Selected images were sent to PACS. Additionally, multiple magnified oblique views of the head were obtained centered on the left MCA bifurcation. The catheter was then placed into the left subclavian artery. Frontal and lateral angiograms of the head were obtained. The catheter was subsequently retracted into the right subclavian artery. Frontal and lateral angiograms of the neck were obtained. Using roadmap guidance, the catheter was advanced into the right vertebral artery. Frontal and lateral angiograms of the head were obtained. The catheter was subsequently withdrawn. An inflatable band was placed and inflated over the right wrist access site. The vascular sheath was withdrawn and the band was slowly deflated until brisk flow was noted through the arteriotomy site. At this point, the band was reinflated with additional 2 cc of air to obtain patent hemostasis. FINDINGS: Right radial artery ultrasound and right radial artery angiogram: The caliber of the distal right radial artery is appropriate for angiogram access. The right radial artery and the right ulnar artery have normal course and caliber. No significant anatomical variants noted. Right CCA angiograms: Calcified atherosclerotic changes of the right carotid bifurcation without hemodynamically significant. Increased tortuosity of the distal cervical segment of the right ICA. Right ICA angiograms: Luminal irregularity of the right carotid siphon without hemodynamically significant stenosis. An infundibular dilatation is noted at the origin of a small posterior communicating artery. There is brisk vascular contrast filling of the right ACA and MCA vascular trees. Flash filling of the left  ACA seen via prominent anterior communicating artery. No aneurysms or abnormally high-flow, early draining veins are seen. No regions of abnormal hypervascularity are noted. The visualized dural sinuses are patent. Left CCA angiograms: Calcified atherosclerotic changes of the left carotid bifurcation without stenosis. Left ICA angiograms: Luminal irregularity of the cavernous segment of the left ICA with mild stenosis at the horizontal portion of the cavernous segment and ectasia of the anterior genu. A saccular aneurysm is seen in the communicating segment measuring 5.3 x 2.7 mm. The posterior communicating artery originates from the neck of the aneurysm. There is brisk vascular contrast filling of the left ACA and MCA vascular trees noting a duplicated left T2/WPY segment. A wide neck saccular aneurysm is seen at the bifurcation of the proximal M1/MCA, measuring approximately 8 x 5 mm. Luminal caliber is smooth and tapering. No abnormally high-flow, early draining veins are seen. No regions of abnormal hypervascularity are noted. The visualized dural sinuses are patent. Left vertebral artery angiograms: The dominant left vertebral artery, basilar artery, and bilateral posterior cerebral arteries  are unremarkable. Luminal caliber is smooth and tapering. No aneurysms or abnormally high-flow, early draining veins are seen. No regions of abnormal hypervascularity are noted. The visualized dural sinuses are patent. Right subclavian angiograms: The right subclavian artery, cervical right vertebral artery and visualized branches of the thyrocervical trunk are unremarkable. Right vertebral artery angiograms: The non dominant right vertebral artery, basilar artery, and bilateral posterior cerebral arteries are unremarkable. Luminal caliber is smooth and tapering. No aneurysms or abnormally high-flow, early draining veins are seen. No regions of abnormal hypervascularity are noted. The visualized dural sinuses are patent.  PROCEDURE: No intervention performed. IMPRESSION: 1. 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 2. 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 3. Luminal irregularity of the bilateral carotid siphons suggestive of intracranial atherosclerotic disease without hemodynamically significant stenosis. 4. Calcified atherosclerotic changes of the bilateral carotid bifurcation without hemodynamically significant stenosis. PLAN: Patient will be brought for office consultation to discuss brain aneurysms management. Electronically Signed   By: Pedro Earls M.D.   On: 09/22/2020 15:45    Treatments: Observation; physical therapy   Discharge Exam: Blood pressure (!) 166/83, pulse 63, temperature 98.3 F (36.8 C), temperature source Oral, resp. rate 13, height _0  (1.854 m), weight 194 lb (88 kg), SpO2 96 %. Physical Exam Constitutional:      General: He is not in acute distress.    Appearance: He is not ill-appearing.  Cardiovascular:     Comments: Right arterial vascular access site clean, dry and non-tender.  Pulmonary:     Effort: Pulmonary effort is normal.  Neurological:     Mental Status: He is alert and oriented to person, place, and time.  Psychiatric:        Mood and Affect: Mood normal.        Behavior: Behavior normal.        Thought Content: Thought content normal.        Judgment: Judgment normal.    Disposition: Discharge disposition: 01-Home or Self Care    Allergies as of 10/05/2020       Reactions   Atorvastatin Other (See Comments)   Unknown reaction        Medication List     STOP taking these medications    ibuprofen 200 MG tablet Commonly known as: ADVIL   naproxen 500 MG tablet Commonly known as: NAPROSYN       TAKE these medications    aspirin EC 81 MG tablet Take 81 mg by mouth daily. Swallow whole.   B-12 1000 MCG Tabs Take 1,000 mcg by mouth at bedtime.    betamethasone dipropionate 0.05 % cream Apply topically 2 (two) times daily as needed (Rash). What changed:  how much to take reasons to take this   calcipotriene 0.005 % cream Commonly known as: DOVONOX Apply to aa's psoriasis QOD alternating with Triamcinolone. What changed:  how much to take how to take this when to take this reasons to take this additional instructions   esomeprazole 20 MG capsule Commonly known as: NEXIUM Take 20 mg by mouth at bedtime.   losartan 100 MG tablet Commonly known as: COZAAR Take 100 mg by mouth at bedtime.   PARoxetine 10 MG tablet Commonly known as: PAXIL Take 10 mg by mouth at bedtime.   rosuvastatin 20 MG tablet Commonly known as: CRESTOR Take 20 mg by mouth daily.   ticagrelor 90 MG Tabs tablet Commonly  known as: Brilinta Take 1 tablet (90 mg total) by mouth 2 (two) times daily.   Trelegy Ellipta 100-62.5-25 MCG/INH Aepb Generic drug: Fluticasone-Umeclidin-Vilant Inhale 1 puff into the lungs daily.   triamcinolone cream 0.1 % Commonly known as: KENALOG APPLY TO AFFECTED AREAS (PSORIASIS) TWICE DAILY AS NEEDED FOR FLARES. AVOID FACE, GROIN, AXILLA. What changed: See the new instructions.        Follow-up Information     [facility] Follow up.   Why: Please follow up with Dr. Karenann Cai for a diagnostic cerebral angiogram with moderate sedation in approximately 6 months. A scheduler from our office will call you with a date/time. Please call our office with any questions/concerns prior to your appointment. Contact information: [place] [address] [phone]                  Electronically Signed: Theresa Duty, NP 10/05/2020, 10:50 AM   I have spent Less Than 30 Minutes discharging [place].Marland Kitchen

## 2020-10-07 ENCOUNTER — Telehealth: Payer: Self-pay | Admitting: Student

## 2020-10-07 NOTE — Telephone Encounter (Signed)
Patient called [facility] asking how long he is supposed to take his Brilinta. Patient advised to continue taking his Brilinta until he has his 6 month check up with Dr. Cephus Shelling and she will advise him if he needs to continue or discontinue this medication. Patient verbalized understanding of this information.  Alwyn Ren, [place] [phone] 10/07/2020, 1:54 PM

## 2020-11-01 ENCOUNTER — Telehealth: Payer: Self-pay | Admitting: Internal Medicine

## 2020-11-01 ENCOUNTER — Telehealth (HOSPITAL_COMMUNITY): Payer: Self-pay | Admitting: Radiology

## 2020-11-01 NOTE — Telephone Encounter (Signed)
Pt called with concern for daily headaches since his 10/04/20 procedure. Per Dr. Quay Burow, headaches are expected for ~ 1 month post procedure. Pt states that he is taking 500 mg Tylenol 2-3 times daily for headaches which is relieved, but headache returns after medication wears off. Pt was advised to take 1000 mg Tylenol every 6 hours but not to exceed 4000 mg in 24 hour period to see if it helps with pain. Even though he has relief with medication, pt wants to come in for re-eval with Dr. Quay Burow concern for continuing headaches. Pt denies vision changes, nausea/vomiting. He rates headache pain from 4-8 that is relieved by Tylenol. He endorses dizziness that was his baseline prior to procedure that has not worsened.  Alessandra Bevels. notified to call pt and schedule an evaluation with Dr. Quay Burow.   Electronically Signed: Shon Hough, AGNP 11/01/2020, 2:28 PM

## 2020-11-01 NOTE — Telephone Encounter (Signed)
Pt called and wanted to know how long he should expect to have headaches following his procedure with Dr. Quay Burow. I forwarded this message to Alex Gardener, NP. She will contact pt. JM

## 2020-11-02 ENCOUNTER — Other Ambulatory Visit (HOSPITAL_COMMUNITY): Payer: Self-pay | Admitting: Neuroradiology

## 2020-11-02 DIAGNOSIS — I671 Cerebral aneurysm, nonruptured: Secondary | ICD-10-CM

## 2020-11-03 ENCOUNTER — Telehealth: Payer: Self-pay | Admitting: Student

## 2020-11-03 ENCOUNTER — Other Ambulatory Visit (HOSPITAL_COMMUNITY): Payer: Self-pay | Admitting: Neuroradiology

## 2020-11-03 DIAGNOSIS — Z9889 Other specified postprocedural states: Secondary | ICD-10-CM

## 2020-11-03 DIAGNOSIS — R519 Headache, unspecified: Secondary | ICD-10-CM

## 2020-11-03 DIAGNOSIS — I671 Cerebral aneurysm, nonruptured: Secondary | ICD-10-CM

## 2020-11-03 DIAGNOSIS — Z8679 Personal history of other diseases of the circulatory system: Secondary | ICD-10-CM

## 2020-11-03 NOTE — Telephone Encounter (Signed)
IR received a message that Mr. Hurrell is requesting anxiety medication prior to scheduled MRI tomorrow. I called Mr. Fiumara to find out which medication he has been prescribed in the past and also to make him aware he will need a driver to/from the hospital if we send in a one-time anxiety medication prescription. No answer, VM message left.  Alwyn Ren, [place] [phone] 11/03/2020, 4:50 PM

## 2020-11-04 ENCOUNTER — Ambulatory Visit (HOSPITAL_COMMUNITY)
Admission: RE | Admit: 2020-11-04 | Discharge: 2020-11-04 | Disposition: A | Discharge status: Home / Self Care | Payer: Medicare PPO | Source: Ambulatory Visit | Attending: Neuroradiology | Admitting: Neuroradiology

## 2020-11-04 ENCOUNTER — Other Ambulatory Visit: Payer: Self-pay

## 2020-11-04 ENCOUNTER — Telehealth: Payer: Self-pay | Admitting: Student

## 2020-11-04 DIAGNOSIS — R519 Headache, unspecified: Secondary | ICD-10-CM

## 2020-11-04 DIAGNOSIS — Z9889 Other specified postprocedural states: Secondary | ICD-10-CM | POA: Diagnosis present

## 2020-11-04 DIAGNOSIS — Z8679 Personal history of other diseases of the circulatory system: Secondary | ICD-10-CM | POA: Diagnosis present

## 2020-11-04 DIAGNOSIS — I671 Cerebral aneurysm, nonruptured: Secondary | ICD-10-CM

## 2020-11-04 MED ORDER — METHYLPREDNISOLONE 4 MG PO TBPK
8.0000 mg | ORAL_TABLET | Freq: Every evening | ORAL | Status: DC
Start: 2020-11-04 — End: 2020-11-04

## 2020-11-04 MED ORDER — METHYLPREDNISOLONE 4 MG PO TBPK: ORAL_TABLET | ORAL | 0 refills | Status: DC

## 2020-11-04 MED ORDER — METHYLPREDNISOLONE 4 MG PO TBPK
4.0000 mg | ORAL_TABLET | ORAL | Status: DC
Start: 2020-11-04 — End: 2020-11-04

## 2020-11-04 MED ORDER — DIAZEPAM 5 MG PO TABS: 5.0000 mg | ORAL_TABLET | Freq: Once | ORAL | Status: AC

## 2020-11-04 MED ORDER — METHYLPREDNISOLONE 4 MG PO TBPK: 8.0000 mg | ORAL_TABLET | Freq: Every morning | ORAL | Status: DC

## 2020-11-04 MED ORDER — METHYLPREDNISOLONE 4 MG PO TBPK: 4.0000 mg | ORAL_TABLET | Freq: Four times a day (QID) | ORAL | Status: DC

## 2020-11-04 MED ORDER — DIAZEPAM 5 MG PO TABS
ORAL_TABLET | ORAL | Status: AC
  Administered 2020-11-04: 5 mg via ORAL
  Filled 2020-11-04: qty 1

## 2020-11-04 MED ORDER — METHYLPREDNISOLONE 4 MG PO TBPK: 4.0000 mg | ORAL_TABLET | Freq: Three times a day (TID) | ORAL | Status: DC

## 2020-11-04 MED ORDER — METHYLPREDNISOLONE 4 MG PO TBPK: 8.0000 mg | ORAL_TABLET | Freq: Every evening | ORAL | Status: DC

## 2020-11-04 NOTE — Progress Notes (Signed)
Referring Physician(s): de Macedo Rodrigues,Khori Rosevear  Chief Complaint: The patient is seen in follow up today s/p Left ICA and left MCA aneurysm endovascular embolization.  History of present illness:  History of Present Illness: Timothy Reeves. is a 65 y.o. male with past medical history significant for coronary artery disease, discoid lupus, erectile dysfunction, gastroesophageal reflux disease with esophagitis, generalized anxiety disorder, hypertension, psoriatic arthritis, hypercholesterolemia and remote history of tobacco use recently found to have a 7mm left ICA aneurysm and 4mm left MCA bifurcation aneurysm. These aneurysm were treated endovascularly  with use of a WEB device for treatment of the left MCA bifurcation aneurysm and 2 partially overlapping flow diverters for treatment of the left ICA aneurysm. The procedure went without any complication. Timothy Reeves complains of headache since the procedure. He tells me it has gotten much better but still persists. He grades the headache as 4/10. No other signs or symptoms are associated.  Past Medical History:  Diagnosis Date   Anxiety    Arthritis    Carotid artery disease (HCC)    COPD (chronic obstructive pulmonary disease) (HCC)    Coronary artery disease    ED (erectile dysfunction)    GERD (gastroesophageal reflux disease)    Headache    hx 20 yrs ago per pt   HLD (hyperlipidemia)    Hypertension    Left bundle branch block (LBBB)    Lupus (HCC)    Psoriasis     Past Surgical History:  Procedure Laterality Date   CARDIAC CATHETERIZATION     IR 3D INDEPENDENT WKST  09/22/2020   IR 3D INDEPENDENT WKST  10/04/2020   IR ANGIO INTRA EXTRACRAN SEL INTERNAL CAROTID BILAT MOD SED  09/22/2020   IR ANGIO INTRA EXTRACRAN SEL INTERNAL CAROTID UNI L MOD SED  10/03/2020   IR ANGIO VERTEBRAL SEL SUBCLAVIAN INNOMINATE UNI L MOD SED  09/22/2020   IR ANGIO VERTEBRAL SEL VERTEBRAL UNI R MOD SED  09/22/2020   IR ANGIOGRAM FOLLOW UP  STUDY  10/04/2020   IR ANGIOGRAM FOLLOW UP STUDY  10/03/2020   IR CT HEAD LTD  10/03/2020   IR NEURO EACH ADD'L AFTER BASIC UNI LEFT (MS)  10/05/2020   IR TRANSCATH/EMBOLIZ  10/03/2020   IR US GUIDE VASC ACCESS RIGHT  09/22/2020   IR US GUIDE VASC ACCESS RIGHT  10/03/2020   RADIOLOGY WITH ANESTHESIA N/A 10/03/2020   Procedure: EMBOLIZATION WITH ANESTHESIA;  Surgeon: Baldemar Lenis, MD;  Location: [facility] OR;  Service: Radiology;  Laterality: N/A;    Allergies: Atorvastatin  Medications: Prior to Admission medications   Medication Sig Start Date End Date Taking? Authorizing Provider  aspirin EC 81 MG tablet Take 81 mg by mouth daily. Swallow whole.    [provider]  betamethasone dipropionate 0.05 % cream Apply topically 2 (two) times daily as needed (Rash). Patient taking differently: Apply 1 application topically 2 (two) times daily as needed (psoriasis). 10/21/19   Deirdre Evener, MD  calcipotriene (DOVONOX) 0.005 % cream Apply to aa's psoriasis QOD alternating with Triamcinolone. Patient taking differently: Apply 1 application topically daily as needed (psoriasis). 11/12/19   Deirdre Evener, MD  Cyanocobalamin (B-12) 1000 MCG TABS Take 1,000 mcg by mouth at bedtime. 07/02/20   [provider]  esomeprazole (NEXIUM) 20 MG capsule Take 20 mg by mouth at bedtime. 08/22/20   [provider]  Fluticasone-Umeclidin-Vilant (TRELEGY ELLIPTA) 100-62.5-25 MCG/INH AEPB Inhale 1 puff into the lungs daily.    [provider]  losartan (COZAAR) 100 MG tablet Take 100 mg by mouth at bedtime. 07/18/20   [provider]  methylPREDNISolone (MEDROL DOSEPAK) 4 MG TBPK tablet Tapering Predisone dose pack - take per package instructions. 11/05/20   Mickie Kay, NP  PARoxetine (PAXIL) 10 MG tablet Take 10 mg by mouth at bedtime.    [provider]  rosuvastatin (CRESTOR) 20 MG tablet Take 20 mg by mouth daily. 08/10/20   [provider]  ticagrelor (BRILINTA) 90 MG TABS tablet Take 1 tablet (90 mg total) by mouth 2 (two) times daily. 09/23/20   Hoyt Koch, PA  triamcinolone cream (KENALOG) 0.1 % APPLY TO AFFECTED AREAS (PSORIASIS) TWICE DAILY AS NEEDED FOR FLARES. AVOID FACE, GROIN, AXILLA. Patient taking differently: Apply 1 application topically 2 (two) times daily as needed (psoriasis). 07/20/19   Deirdre Evener, MD     No family history on file.  Social History   Socioeconomic History   Marital status: Married    Spouse name: Not on file   Number of children: Not on file   Years of education: Not on file   Highest education level: Not on file  Occupational History   Not on file  Tobacco Use   Smoking status: Former    Packs/day: 1.00    Years: 37.00    Pack years: 37.00    Types: Cigarettes    Quit date: 10/04/2008    Years since quitting: 12.0   Smokeless tobacco: Never  Vaping Use   Vaping Use: Never used  Substance and Sexual Activity   Alcohol use: Yes    Alcohol/week: 2.0 standard drinks    Types: 2 Cans of beer per week   Drug use: Not Currently   Sexual activity: Not Currently  Other Topics Concern   Not on file  Social History Narrative   Not on file   Social Determinants of Health   Financial Resource Strain: Not on file  Food Insecurity: Not on file  Transportation Needs: Not on file  Physical Activity: Not on file  Stress: Not on file  Social Connections: Not on file     Vital Signs: There were no vitals taken for this visit.  Physical Exam Constitutional:      Appearance: Normal appearance.  Eyes:     Extraocular Movements: Extraocular movements intact.     Conjunctiva/sclera: Conjunctivae normal.     Pupils: Pupils are equal, round, and reactive to light.  Neurological:     Mental Status: He is alert and oriented to person, place, and time.     Cranial Nerves: No cranial nerve deficit, dysarthria or facial asymmetry.     Sensory: Sensation is intact.      Motor: Weakness present.     Comments: Very mild LLE weakness that patient refers to be long stanging and related to his left knee.       Imaging: [name] underwent an MRI of the brain today.  While results are still pending, I personally reviewed the images.  No evidence of procedure related complication is seen.  Expected susceptibility artifact from the web device in the left MCA bifurcation and from the flow diverter construct along the intracranial left ICA are seen.  Aneurysm patency cannot be evaluated by MRI of the brain alone. MRA was ordered but not performed.  Labs:  CBC: Recent Labs    03/29/20 1027 09/22/20 0744 10/03/20 0628  WBC 5.1 4.5 5.2  HGB 13.8 12.2* 14.6  HCT 39.9 37.7* 43.6  PLT 205 237 250    COAGS: Recent Labs    09/22/20 0744 10/03/20 0628  INR 1.0 0.9    BMP: Recent Labs    03/29/20 1027 09/22/20 0744 10/03/20 0628  NA 138 138 138  K 4.2 4.3 4.0  CL 107 108 106  CO2 24 22 21*  GLUCOSE 105* 108* 107*  BUN 21 21 17   CALCIUM 9.3 9.2 9.5  CREATININE 0.81 0.96 1.00  GFRNONAA >60 >60 >60    LIVER FUNCTION TESTS: Recent Labs    03/29/20 1027  BILITOT 1.2  AST 23  ALT 19  ALKPHOS 77  PROT 7.4  ALBUMIN 4.1    Assessment:  Timothy Reeves presents with persistent mild-to-moderate headaches since endovascular treatment of left anterior circulation aneurysms.  No focal neurological deficit or imaging evidence of treatment complication.  I explained that headache and/or inflammatory changes post implant of flow diverters have been described.  No imaging evidence of significant inflammatory changes although cannot exclude mild inflammation.  Headaches usually subside around 1 month; given persistent headache, we will try Medrol Dosepak.  He was instructed to take Tylenol for headaches and avoid NSAID.  He agrees with the plan.  An order will be placed to his pharmacy in [place].   Signed: [place], MD 11/04/2020,  4:32 PM    I spent a total of    25 Minutes in face to face in clinical consultation, greater than 50% of which was counseling/coordinating care for left MCA and left ICA aneurysms.

## 2020-11-04 NOTE — Telephone Encounter (Signed)
Medrol dose pack e-prescribed to the patient's pharmacy in [place]. I spoke with the pharmacist about the regimen Dr. Salvadore Dom specified and they stated that is what the instructions are on the Dose Pack box.   Instructions as follows:  Day 1: 8 mg PO before breakfast, 4 mg after lunch and after dinner, and 8 mg at bedtime  Day 2: 4 mg PO before breakfast, after lunch, and after dinner and 8 mg at bedtime  Day 3: 4 mg PO before breakfast, after lunch, after dinner, and at bedtime  Day 4: 4 mg PO before breakfast, after lunch, and at bedtime  Day 5: 4 mg PO before breakfast and at bedtime  Day 6: 4 mg PO before breakfast.  Alwyn Ren, AGACNP-BC [phone] 11/04/2020, 1:30 PM

## 2020-11-07 ENCOUNTER — Ambulatory Visit (HOSPITAL_COMMUNITY): Payer: Medicare PPO

## 2020-11-30 ENCOUNTER — Telehealth: Payer: Self-pay | Admitting: Internal Medicine

## 2020-11-30 DIAGNOSIS — I6529 Occlusion and stenosis of unspecified carotid artery: Secondary | ICD-10-CM

## 2020-11-30 MED ORDER — TICAGRELOR 90 MG PO TABS
90.0000 mg | ORAL_TABLET | Freq: Two times a day (BID) | ORAL | 1 refills | Status: DC
Start: 2020-11-30 — End: 2021-04-13

## 2020-11-30 NOTE — Progress Notes (Signed)
Pt called and requested refill for his Brilinta. Prescription was sent electronically to pharmacy on file.  Alex Gardener, AGNP-BC 11/30/2020, 9:49 AM

## 2020-12-06 ENCOUNTER — Encounter (INDEPENDENT_AMBULATORY_CARE_PROVIDER_SITE_OTHER): Payer: Medicare PPO | Admitting: Vascular Surgery

## 2020-12-08 ENCOUNTER — Ambulatory Visit (INDEPENDENT_AMBULATORY_CARE_PROVIDER_SITE_OTHER): Payer: Medicare PPO | Admitting: Vascular Surgery

## 2020-12-12 ENCOUNTER — Ambulatory Visit (INDEPENDENT_AMBULATORY_CARE_PROVIDER_SITE_OTHER): Payer: Medicare PPO | Admitting: Vascular Surgery

## 2020-12-13 ENCOUNTER — Other Ambulatory Visit: Payer: Self-pay | Admitting: Pulmonary Disease

## 2020-12-13 DIAGNOSIS — R911 Solitary pulmonary nodule: Secondary | ICD-10-CM

## 2020-12-19 ENCOUNTER — Encounter (INDEPENDENT_AMBULATORY_CARE_PROVIDER_SITE_OTHER): Payer: Self-pay | Admitting: Vascular Surgery

## 2020-12-19 ENCOUNTER — Ambulatory Visit (INDEPENDENT_AMBULATORY_CARE_PROVIDER_SITE_OTHER): Payer: Medicare PPO | Admitting: Vascular Surgery

## 2020-12-19 ENCOUNTER — Other Ambulatory Visit: Payer: Self-pay

## 2020-12-19 VITALS — BP 160/84 | HR 89 | Ht 73.0 in | Wt 196.0 lb

## 2020-12-19 DIAGNOSIS — I6523 Occlusion and stenosis of bilateral carotid arteries: Secondary | ICD-10-CM

## 2020-12-19 DIAGNOSIS — K219 Gastro-esophageal reflux disease without esophagitis: Secondary | ICD-10-CM

## 2020-12-19 DIAGNOSIS — I10 Essential (primary) hypertension: Secondary | ICD-10-CM | POA: Diagnosis not present

## 2020-12-19 DIAGNOSIS — I671 Cerebral aneurysm, nonruptured: Secondary | ICD-10-CM

## 2020-12-19 DIAGNOSIS — R7303 Prediabetes: Secondary | ICD-10-CM

## 2020-12-26 ENCOUNTER — Encounter (INDEPENDENT_AMBULATORY_CARE_PROVIDER_SITE_OTHER): Payer: Self-pay | Admitting: Vascular Surgery

## 2020-12-26 NOTE — Progress Notes (Signed)
MRN : [id number]  Timothy Reeves. is a 65 y.o. (02-Feb-1955) male who presents with chief complaint of check aneurysm.  History of Present Illness:   Patient with known cerebral aneurysm is concerned about aneurysms in other locations.   Patient denies abdominal pain, chest pain or unusual back pain, no other abdominal complaints.  No history of an acute onset of painful blue discoloration of the toes.     No family history of AAA.   Patient denies amaurosis fugax or TIA symptoms. There is no history of claudication or rest pain symptoms of the lower extremities.  The patient denies angina or shortness of breath.  CT scan of the chest 07/07/2020 is negative for aneurysmal disease.     Duplex ultrasound of the abdominal aorta date 03/07/2020 is negative for AAA  Current Meds  Medication Sig   aspirin EC 81 MG tablet Take 81 mg by mouth daily. Swallow whole.   betamethasone dipropionate 0.05 % cream Apply topically 2 (two) times daily as needed (Rash). (Patient taking differently: Apply 1 application topically 2 (two) times daily as needed (psoriasis).)   calcipotriene (DOVONOX) 0.005 % cream Apply to aa's psoriasis QOD alternating with Triamcinolone. (Patient taking differently: Apply 1 application topically daily as needed (psoriasis).)   Cyanocobalamin (B-12) 1000 MCG TABS Take 1,000 mcg by mouth at bedtime.   Fluticasone-Umeclidin-Vilant (TRELEGY ELLIPTA) 100-62.5-25 MCG/INH AEPB Inhale 1 puff into the lungs daily.   losartan (COZAAR) 100 MG tablet Take 100 mg by mouth at bedtime.   PARoxetine (PAXIL) 10 MG tablet Take 10 mg by mouth at bedtime.   rosuvastatin (CRESTOR) 20 MG tablet Take 20 mg by mouth daily.   ticagrelor (BRILINTA) 90 MG TABS tablet Take 1 tablet (90 mg total) by mouth 2 (two) times daily.   triamcinolone cream (KENALOG) 0.1 % APPLY TO AFFECTED AREAS (PSORIASIS) TWICE DAILY AS NEEDED FOR FLARES. AVOID FACE, GROIN, AXILLA. (Patient taking differently: Apply 1  application topically 2 (two) times daily as needed (psoriasis).)   [DISCONTINUED] pantoprazole (PROTONIX) 20 MG tablet Take by mouth.    Past Medical History:  Diagnosis Date   Anxiety    Arthritis    Carotid artery disease (HCC)    COPD (chronic obstructive pulmonary disease) (HCC)    Coronary artery disease    ED (erectile dysfunction)    GERD (gastroesophageal reflux disease)    Headache    hx 20 yrs ago per pt   HLD (hyperlipidemia)    Hypertension    Left bundle branch block (LBBB)    Lupus (HCC)    Psoriasis     Past Surgical History:  Procedure Laterality Date   CARDIAC CATHETERIZATION     IR 3D INDEPENDENT WKST  09/22/2020   IR 3D INDEPENDENT WKST  10/04/2020   IR ANGIO INTRA EXTRACRAN SEL INTERNAL CAROTID BILAT MOD SED  09/22/2020   IR ANGIO INTRA EXTRACRAN SEL INTERNAL CAROTID UNI L MOD SED  10/03/2020   IR ANGIO VERTEBRAL SEL SUBCLAVIAN INNOMINATE UNI L MOD SED  09/22/2020   IR ANGIO VERTEBRAL SEL VERTEBRAL UNI R MOD SED  09/22/2020   IR ANGIOGRAM FOLLOW UP STUDY  10/04/2020   IR ANGIOGRAM FOLLOW UP STUDY  10/03/2020   IR CT HEAD LTD  10/03/2020   IR NEURO EACH ADD'L AFTER BASIC UNI LEFT (MS)  10/05/2020   IR TRANSCATH/EMBOLIZ  10/03/2020   IR US GUIDE VASC ACCESS RIGHT  09/22/2020   IR US GUIDE VASC ACCESS RIGHT  10/03/2020  RADIOLOGY WITH ANESTHESIA N/A 10/03/2020   Procedure: EMBOLIZATION WITH ANESTHESIA;  Surgeon: Baldemar Lenis, MD;  Location: [facility] OR;  Service: Radiology;  Laterality: N/A;    Social History Social History   Tobacco Use   Smoking status: Former    Packs/day: 1.00    Years: 37.00    Pack years: 37.00    Types: Cigarettes    Quit date: 10/04/2008    Years since quitting: 12.2   Smokeless tobacco: Never  Vaping Use   Vaping Use: Never used  Substance Use Topics   Alcohol use: Yes    Alcohol/week: 2.0 standard drinks    Types: 2 Cans of beer per week   Drug use: Not Currently    Family History No family history on  file.  Allergies  Allergen Reactions   Atorvastatin Other (See Comments)    Unknown reaction     REVIEW OF SYSTEMS (Negative unless checked)  Constitutional: [] Weight loss  [] Fever  [] Chills Cardiac: [] Chest pain   [] Chest pressure   [] Palpitations   [] Shortness of breath when laying flat   [] Shortness of breath with exertion. Vascular:  [] Pain in legs with walking   [] Pain in legs at rest  [] History of DVT   [] Phlebitis   [] Swelling in legs   [] Varicose veins   [] Non-healing ulcers Pulmonary:   [] Uses home oxygen   [] Productive cough   [] Hemoptysis   [] Wheeze  [] COPD   [] Asthma Neurologic:  [] Dizziness   [] Seizures   [] History of stroke   [] History of TIA  [] Aphasia   [] Vissual changes   [] Weakness or numbness in arm   [] Weakness or numbness in leg Musculoskeletal:   [] Joint swelling   [] Joint pain   [] Low back pain Hematologic:  [] Easy bruising  [] Easy bleeding   [] Hypercoagulable state   [] Anemic Gastrointestinal:  [] Diarrhea   [] Vomiting  [x] Gastroesophageal reflux/heartburn   [] Difficulty swallowing. Genitourinary:  [] Chronic kidney disease   [] Difficult urination  [] Frequent urination   [] Blood in urine Skin:  [] Rashes   [] Ulcers  Psychological:  [] History of anxiety   []  History of major depression.  Physical Examination  Vitals:   12/19/20 1105  BP: (!) 160/84  Pulse: 89  Weight: 196 lb (88.9 kg)  Height: 6\' 1"  (1.854 m)   Body mass index is 25.86 kg/m. Gen: WD/WN, NAD Head: [place]/AT, No temporalis wasting.  Ear/Nose/Throat: Hearing grossly intact, nares w/o erythema or drainage Eyes: PER, EOMI, sclera nonicteric.  Neck: Supple, no masses.  No bruit or JVD.  Pulmonary:  Good air movement, no audible wheezing, no use of accessory muscles.  Cardiac: RRR, normal S1, S2, no Murmurs. Vascular:   carotid bruits noted Vessel Right Left  Radial Palpable Palpable  Carotid Palpable Palpable  Gastrointestinal: soft, non-distended. No guarding/no peritoneal signs.   Musculoskeletal: M/S 5/5 throughout.  No visible deformity.  Neurologic: CN 2-12 intact. Pain and light touch intact in extremities.  Symmetrical.  Speech is fluent. Motor exam as listed above. Psychiatric: Judgment intact, Mood & affect appropriate for pt's clinical situation. Dermatologic: No rashes or ulcers noted.  No changes consistent with cellulitis.   CBC Lab Results  Component Value Date   WBC 5.2 10/03/2020   HGB 14.6 10/03/2020   HCT 43.6 10/03/2020   MCV 90.3 10/03/2020   PLT 250 10/03/2020    BMET    Component Value Date/Time   NA 138 10/03/2020 0628   K 4.0 10/03/2020 0628   CL 106 10/03/2020 0628   CO2 21 (  L) 10/03/2020 0628   GLUCOSE 107 (H) 10/03/2020 0628   BUN 17 10/03/2020 0628   CREATININE 1.00 10/03/2020 0628   CALCIUM 9.5 10/03/2020 0628   GFRNONAA >60 10/03/2020 0628   CrCl cannot be calculated (Patient's most recent lab result is older than the maximum 21 days allowed.).  COAG Lab Results  Component Value Date   INR 0.9 10/03/2020   INR 1.0 09/22/2020    Radiology No results found.   Assessment/Plan 1. Aneurysm, cerebral, nonruptured No evidence for other aneueysmal disease.  Aortic ultrasound showed the aorta was 2.8 cm so a follow up duplex in several years is indicated   A total of 35 minutes was spent with this patient and greater than 50% was spent in counseling and coordination of care with the patient.  Discussion included the treatment options for vascular disease including indications for surgery and intervention.  Also discussed is the appropriate timing of treatment.  In addition medical therapy was discussed.   2. Bilateral carotid artery stenosis Recommend:  Given the patient's asymptomatic carotid bruit no further invasive testing or surgery at this time.  Duplex ultrasound is indicated and will be ordered.  Continue antiplatelet therapy as prescribed Continue management of CAD, HTN and Hyperlipidemia Healthy heart  diet,  encouraged exercise at least 4 times per week  - VAS US CAROTID; Future  3. Essential hypertension Continue antihypertensive medications as already ordered, these medications have been reviewed and there are no changes at this time.   4. Gastroesophageal reflux disease without esophagitis Continue PPI as already ordered, this medication has been reviewed and there are no changes at this time.  Avoidence of caffeine and alcohol  Moderate elevation of the head of the bed    5. Borderline diabetes mellitus Continue to assess for progression  Hgb A1C to be monitored as already arranged by primary service     Levora Dredge, MD  12/26/2020 2:24 PM

## 2021-01-04 DIAGNOSIS — J449 Chronic obstructive pulmonary disease, unspecified: Secondary | ICD-10-CM | POA: Insufficient documentation

## 2021-01-06 ENCOUNTER — Other Ambulatory Visit: Payer: Medicare PPO

## 2021-01-11 ENCOUNTER — Telehealth: Payer: Self-pay | Admitting: Physician Assistant

## 2021-01-11 NOTE — Telephone Encounter (Signed)
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  Patient calling today inquiring when Brilinta can be stopped, he previously had some mild nose bleeding but this has stopped. He denies any other abnormal bleeding. He is somewhat concerned about the increased risk of bleeding with this medication so would like to take it the shortest amount of time that is safe.  Reviewed with Dr Tommie Sams who recommends patient remain on Brilinta for at least 6 months post procedure unless there are further abnormal bleeding concerns.  Discussed above with patient who is happy to remain on Brilinta at this time. We will review the continued need for Brilinta at the 6 month mark (approximately 03/03/21).  Patient encouraged to call [facility] with any further questions or concerns.  Lynnette Caffey, PA-C

## 2021-01-15 NOTE — Progress Notes (Signed)
MRN : [id number]  Timothy Reeves. is a 66 y.o. (11/16/1955) male who presents with chief complaint of check carotid.  History of Present Illness:   The patient is seen for follow up evaluation of carotid stenosis. The carotid stenosis followed by ultrasound.   The patient denies amaurosis fugax. There is no recent history of TIA symptoms or focal motor deficits. There is no prior documented CVA.  The patient is taking enteric-coated aspirin 81 mg daily.  There is no history of migraine headaches. There is no history of seizures.  The patient has a history of coronary artery disease, no recent episodes of angina or shortness of breath. The patient denies PAD or claudication symptoms. There is a history of hyperlipidemia which is being treated with a statin.    No family history of AAA.    Patient denies amaurosis fugax or TIA symptoms. There is no history of claudication or rest pain symptoms of the lower extremities.  The patient denies angina or shortness of breath.   CT scan of the chest 07/07/2020 is negative for aneurysmal disease.      Previous duplex ultrasound of the abdominal aorta date 03/07/2020 is negative for AAA  Carotid Duplex done today shows <30% bilaterally  No outpatient medications have been marked as taking for the 01/16/21 encounter (Appointment) with Gilda Crease, Latina Craver, MD.    Past Medical History:  Diagnosis Date   Anxiety    Arthritis    Carotid artery disease (HCC)    COPD (chronic obstructive pulmonary disease) (HCC)    Coronary artery disease    ED (erectile dysfunction)    GERD (gastroesophageal reflux disease)    Headache    hx 20 yrs ago per pt   HLD (hyperlipidemia)    Hypertension    Left bundle branch block (LBBB)    Lupus (HCC)    Psoriasis     Past Surgical History:  Procedure Laterality Date   CARDIAC CATHETERIZATION     IR 3D INDEPENDENT WKST  09/22/2020   IR 3D INDEPENDENT WKST  10/04/2020   IR ANGIO INTRA EXTRACRAN SEL  INTERNAL CAROTID BILAT MOD SED  09/22/2020   IR ANGIO INTRA EXTRACRAN SEL INTERNAL CAROTID UNI L MOD SED  10/03/2020   IR ANGIO VERTEBRAL SEL SUBCLAVIAN INNOMINATE UNI L MOD SED  09/22/2020   IR ANGIO VERTEBRAL SEL VERTEBRAL UNI R MOD SED  09/22/2020   IR ANGIOGRAM FOLLOW UP STUDY  10/04/2020   IR ANGIOGRAM FOLLOW UP STUDY  10/03/2020   IR CT HEAD LTD  10/03/2020   IR NEURO EACH ADD'L AFTER BASIC UNI LEFT (MS)  10/05/2020   IR TRANSCATH/EMBOLIZ  10/03/2020   IR US GUIDE VASC ACCESS RIGHT  09/22/2020   IR US GUIDE VASC ACCESS RIGHT  10/03/2020   RADIOLOGY WITH ANESTHESIA N/A 10/03/2020   Procedure: EMBOLIZATION WITH ANESTHESIA;  Surgeon: Baldemar Lenis, MD;  Location: [facility] OR;  Service: Radiology;  Laterality: N/A;    Social History Social History   Tobacco Use   Smoking status: Former    Packs/day: 1.00    Years: 37.00    Pack years: 37.00    Types: Cigarettes    Quit date: 10/04/2008    Years since quitting: 12.2   Smokeless tobacco: Never  Vaping Use   Vaping Use: Never used  Substance Use Topics   Alcohol use: Yes    Alcohol/week: 2.0 standard drinks    Types: 2 Cans of beer per week  Drug use: Not Currently    Family History No family history on file.  Allergies  Allergen Reactions   Atorvastatin Other (See Comments)    Unknown reaction     REVIEW OF SYSTEMS (Negative unless checked)  Constitutional: [] Weight loss  [] Fever  [] Chills Cardiac: [] Chest pain   [] Chest pressure   [] Palpitations   [] Shortness of breath when laying flat   [] Shortness of breath with exertion. Vascular:  [] Pain in legs with walking   [] Pain in legs at rest  [] History of DVT   [] Phlebitis   [] Swelling in legs   [] Varicose veins   [] Non-healing ulcers Pulmonary:   [] Uses home oxygen   [] Productive cough   [] Hemoptysis   [] Wheeze  [] COPD   [] Asthma Neurologic:  [] Dizziness   [] Seizures   [] History of stroke   [] History of TIA  [] Aphasia   [] Vissual changes   [] Weakness or numbness in arm    [] Weakness or numbness in leg Musculoskeletal:   [] Joint swelling   [] Joint pain   [] Low back pain Hematologic:  [] Easy bruising  [] Easy bleeding   [] Hypercoagulable state   [] Anemic Gastrointestinal:  [] Diarrhea   [] Vomiting  [x] Gastroesophageal reflux/heartburn   [] Difficulty swallowing. Genitourinary:  [] Chronic kidney disease   [] Difficult urination  [] Frequent urination   [] Blood in urine Skin:  [] Rashes   [] Ulcers  Psychological:  [] History of anxiety   []  History of major depression.  Physical Examination  There were no vitals filed for this visit. There is no height or weight on file to calculate BMI. Gen: WD/WN, NAD Head: [place]/AT, No temporalis wasting.  Ear/Nose/Throat: Hearing grossly intact, nares w/o erythema or drainage Eyes: PER, EOMI, sclera nonicteric.  Neck: Supple, no masses.  No bruit or JVD.  Pulmonary:  Good air movement, no audible wheezing, no use of accessory muscles.  Cardiac: RRR, normal S1, S2, no Murmurs. Vascular:   no carotid bruits Vessel Right Left  Radial Palpable Palpable  Carotid Palpable Palpable  Gastrointestinal: soft, non-distended. No guarding/no peritoneal signs.  Musculoskeletal: M/S 5/5 throughout.  No visible deformity.  Neurologic: CN 2-12 intact. Pain and light touch intact in extremities.  Symmetrical.  Speech is fluent. Motor exam as listed above. Psychiatric: Judgment intact, Mood & affect appropriate for pt's clinical situation. Dermatologic: No rashes or ulcers noted.  No changes consistent with cellulitis.   CBC Lab Results  Component Value Date   WBC 5.2 10/03/2020   HGB 14.6 10/03/2020   HCT 43.6 10/03/2020   MCV 90.3 10/03/2020   PLT 250 10/03/2020    BMET    Component Value Date/Time   NA 138 10/03/2020 0628   K 4.0 10/03/2020 0628   CL 106 10/03/2020 0628   CO2 21 (L) 10/03/2020 0628   GLUCOSE 107 (H) 10/03/2020 0628   BUN 17 10/03/2020 0628   CREATININE 1.00 10/03/2020 0628   CALCIUM 9.5 10/03/2020 0628    GFRNONAA >60 10/03/2020 0628   CrCl cannot be calculated (Patient's most recent lab result is older than the maximum 21 days allowed.).  COAG Lab Results  Component Value Date   INR 0.9 10/03/2020   INR 1.0 09/22/2020    Radiology No results found.   Assessment/Plan 1. Bilateral carotid artery stenosis Recommend:  Given the patient's asymptomatic subcritical stenosis no further invasive testing or surgery at this time.  Duplex ultrasound shows <30% stenosis bilaterally.  Continue antiplatelet therapy as prescribed Continue management of CAD, HTN and Hyperlipidemia Healthy heart diet,  encouraged exercise at least 4 times  per week Follow up in 5 years with duplex ultrasound and physical exam    2. Essential hypertension Continue antihypertensive medications as already ordered, these medications have been reviewed and there are no changes at this time.   3. Gastroesophageal reflux disease without esophagitis Continue PPI as already ordered, this medication has been reviewed and there are no changes at this time.  Avoidence of caffeine and alcohol  Moderate elevation of the head of the bed    4. Mixed hyperlipidemia Continue statin as ordered and reviewed, no changes at this time     Levora DredgeGregory Adriahna Shearman, MD  01/15/2021 1:05 PM

## 2021-01-16 ENCOUNTER — Encounter (INDEPENDENT_AMBULATORY_CARE_PROVIDER_SITE_OTHER): Payer: Self-pay | Admitting: Vascular Surgery

## 2021-01-16 ENCOUNTER — Ambulatory Visit (INDEPENDENT_AMBULATORY_CARE_PROVIDER_SITE_OTHER): Payer: Medicare PPO | Admitting: Vascular Surgery

## 2021-01-16 ENCOUNTER — Other Ambulatory Visit: Payer: Self-pay

## 2021-01-16 ENCOUNTER — Ambulatory Visit (INDEPENDENT_AMBULATORY_CARE_PROVIDER_SITE_OTHER): Payer: Medicare PPO

## 2021-01-16 VITALS — BP 130/73 | HR 82 | Resp 16 | Wt 190.2 lb

## 2021-01-16 DIAGNOSIS — K219 Gastro-esophageal reflux disease without esophagitis: Secondary | ICD-10-CM

## 2021-01-16 DIAGNOSIS — E782 Mixed hyperlipidemia: Secondary | ICD-10-CM | POA: Diagnosis not present

## 2021-01-16 DIAGNOSIS — I6523 Occlusion and stenosis of bilateral carotid arteries: Secondary | ICD-10-CM

## 2021-01-16 DIAGNOSIS — I10 Essential (primary) hypertension: Secondary | ICD-10-CM

## 2021-01-22 ENCOUNTER — Encounter (INDEPENDENT_AMBULATORY_CARE_PROVIDER_SITE_OTHER): Payer: Self-pay | Admitting: Vascular Surgery

## 2021-01-27 ENCOUNTER — Other Ambulatory Visit: Payer: Self-pay | Admitting: Physician Assistant

## 2021-01-27 MED ORDER — TICAGRELOR 90 MG PO TABS: 90.0000 mg | ORAL_TABLET | Freq: Two times a day (BID) | ORAL | 3 refills | Status: DC

## 2021-03-28 ENCOUNTER — Other Ambulatory Visit (HOSPITAL_COMMUNITY): Payer: Self-pay | Admitting: Pulmonary Disease

## 2021-03-28 ENCOUNTER — Telehealth (HOSPITAL_COMMUNITY): Payer: Self-pay

## 2021-03-28 ENCOUNTER — Other Ambulatory Visit: Payer: Self-pay | Admitting: Pulmonary Disease

## 2021-03-28 DIAGNOSIS — R911 Solitary pulmonary nodule: Secondary | ICD-10-CM

## 2021-03-28 NOTE — Telephone Encounter (Signed)
Returned pt's call, will call to schedule angiogram once we hear back regarding auth. AW  ?

## 2021-04-03 ENCOUNTER — Ambulatory Visit: Admission: RE | Admit: 2021-04-03 | Payer: Medicare PPO | Source: Ambulatory Visit

## 2021-04-11 ENCOUNTER — Other Ambulatory Visit (HOSPITAL_COMMUNITY): Payer: Self-pay | Admitting: Neuroradiology

## 2021-04-11 DIAGNOSIS — I671 Cerebral aneurysm, nonruptured: Secondary | ICD-10-CM

## 2021-04-13 ENCOUNTER — Other Ambulatory Visit: Payer: Self-pay | Admitting: Radiology

## 2021-04-13 ENCOUNTER — Ambulatory Visit
Admission: RE | Admit: 2021-04-13 | Discharge: 2021-04-13 | Disposition: A | Discharge status: Home / Self Care | Payer: Medicare PPO | Source: Ambulatory Visit | Attending: Pulmonary Disease | Admitting: Pulmonary Disease

## 2021-04-13 DIAGNOSIS — R911 Solitary pulmonary nodule: Secondary | ICD-10-CM | POA: Insufficient documentation

## 2021-04-14 ENCOUNTER — Ambulatory Visit (HOSPITAL_COMMUNITY)
Admission: RE | Admit: 2021-04-14 | Discharge: 2021-04-14 | Disposition: A | Discharge status: Home / Self Care | Payer: Medicare PPO | Source: Ambulatory Visit | Attending: Neuroradiology | Admitting: Neuroradiology

## 2021-04-14 ENCOUNTER — Encounter (HOSPITAL_COMMUNITY): Payer: Self-pay

## 2021-04-14 ENCOUNTER — Other Ambulatory Visit: Payer: Self-pay

## 2021-04-14 DIAGNOSIS — E785 Hyperlipidemia, unspecified: Secondary | ICD-10-CM | POA: Diagnosis not present

## 2021-04-14 DIAGNOSIS — I671 Cerebral aneurysm, nonruptured: Secondary | ICD-10-CM | POA: Diagnosis present

## 2021-04-14 DIAGNOSIS — M329 Systemic lupus erythematosus, unspecified: Secondary | ICD-10-CM | POA: Diagnosis not present

## 2021-04-14 DIAGNOSIS — Z539 Procedure and treatment not carried out, unspecified reason: Secondary | ICD-10-CM | POA: Insufficient documentation

## 2021-04-14 DIAGNOSIS — J432 Centrilobular emphysema: Secondary | ICD-10-CM | POA: Insufficient documentation

## 2021-04-14 DIAGNOSIS — K219 Gastro-esophageal reflux disease without esophagitis: Secondary | ICD-10-CM | POA: Diagnosis not present

## 2021-04-14 DIAGNOSIS — I10 Essential (primary) hypertension: Secondary | ICD-10-CM | POA: Insufficient documentation

## 2021-04-14 DIAGNOSIS — I2510 Atherosclerotic heart disease of native coronary artery without angina pectoris: Secondary | ICD-10-CM | POA: Insufficient documentation

## 2021-04-14 LAB — BASIC METABOLIC PANEL
Anion gap: 6 (ref 5–15)
BUN: 21 mg/dL (ref 8–23)
CO2: 22 mmol/L (ref 22–32)
Calcium: 9.1 mg/dL (ref 8.9–10.3)
Chloride: 109 mmol/L (ref 98–111)
Creatinine, Ser: 1.13 mg/dL (ref 0.61–1.24)
GFR, Estimated: >60 mL/min (ref >60)
Glucose, Bld: 104 mg/dL — ABNORMAL HIGH (ref 70–99)
Potassium: 3.9 mmol/L (ref 3.5–5.1)
Sodium: 137 mmol/L (ref 135–145)

## 2021-04-14 LAB — CBC
HCT: 39 % (ref 39.0–52.0)
Hemoglobin: 13.3 g/dL (ref 13.0–17.0)
MCH: 30.1 pg (ref 26.0–34.0)
MCHC: 34.1 g/dL (ref 30.0–36.0)
MCV: 88.2 fL (ref 80.0–100.0)
Platelets: 202 10*3/uL (ref 150–400)
RBC: 4.42 MIL/uL (ref 4.22–5.81)
RDW: 13.1 % (ref 11.5–15.5)
WBC: 5.9 10*3/uL (ref 4.0–10.5)
nRBC: 0 % (ref 0.0–0.2)

## 2021-04-14 LAB — PROTIME-INR
INR: 1 (ref 0.8–1.2)
Prothrombin Time: 13 seconds (ref 11.4–15.2)

## 2021-04-14 MED ORDER — LIDOCAINE HCL 1 % IJ SOLN
INTRAMUSCULAR | Status: AC
  Filled 2021-04-14: qty 20

## 2021-04-14 MED ORDER — NITROGLYCERIN 1 MG/10 ML FOR IR/CATH LAB
INTRA_ARTERIAL | Status: AC
  Filled 2021-04-14: qty 10

## 2021-04-14 MED ORDER — MIDAZOLAM HCL 2 MG/2ML IJ SOLN
INTRAMUSCULAR | Status: AC
  Filled 2021-04-14: qty 2

## 2021-04-14 MED ORDER — FENTANYL CITRATE (PF) 100 MCG/2ML IJ SOLN
INTRAMUSCULAR | Status: AC
Start: 2021-04-14 — End: 2021-04-14
  Filled 2021-04-14: qty 2

## 2021-04-14 MED ORDER — SODIUM CHLORIDE 0.9 % IV SOLN: Freq: Once | INTRAVENOUS | Status: AC

## 2021-04-14 MED ORDER — HEPARIN SODIUM (PORCINE) 1000 UNIT/ML IJ SOLN
INTRAMUSCULAR | Status: AC
  Filled 2021-04-14: qty 10

## 2021-04-14 MED ORDER — VERAPAMIL HCL 2.5 MG/ML IV SOLN
INTRAVENOUS | Status: AC
  Filled 2021-04-14: qty 2

## 2021-04-14 NOTE — H&P (Signed)
? ?Chief Complaint: ?Patient was seen in consultation today for brain aneurysms ? ?Referring Physician(s): ?de Macedo Rodrigues,[name] ? ?Supervising Physician: Baldemar Lenise Macedo Rodrigues, [name] ? ?[facility] ? ?History of Present Illness: ?Timothy BogaCharles W Keltz Jr. is a 66 y.o. male CAD, COPD, GERD, HLD, HTN, lupus, with history of brain aneurysms who underwent successful endovascular embolization of the left MCA wide neck bifurcation aneurysm with WEB device and successful pipeline flex flow diverter placement for left ICA communicating segment aneurysm 10/03/20. He presents today for follow-up diagnostic angiogram for surveillance.   ? ?Mr. Timothy Reeves presents in his usual state of health today. He is feeling well with no new complaints.  He has been NPO.  Takes aspirin and Brilinta regularly.  Denies new concerns.  He is agreeable to procedure today. ? ?Past Medical History:  ?Diagnosis Date  ? Anxiety   ? Arthritis   ? Carotid artery disease (HCC)   ? COPD (chronic obstructive pulmonary disease) (HCC)   ? Coronary artery disease   ? ED (erectile dysfunction)   ? GERD (gastroesophageal reflux disease)   ? Headache   ? hx 20 yrs ago per pt  ? HLD (hyperlipidemia)   ? Hypertension   ? Left bundle branch block (LBBB)   ? Lupus (HCC)   ? Psoriasis   ? ? ?Past Surgical History:  ?Procedure Laterality Date  ? CARDIAC CATHETERIZATION    ? IR 3D INDEPENDENT WKST  09/22/2020  ? IR 3D INDEPENDENT WKST  10/04/2020  ? IR ANGIO INTRA EXTRACRAN SEL INTERNAL CAROTID BILAT MOD SED  09/22/2020  ? IR ANGIO INTRA EXTRACRAN SEL INTERNAL CAROTID UNI L MOD SED  10/03/2020  ? IR ANGIO VERTEBRAL SEL SUBCLAVIAN INNOMINATE UNI L MOD SED  09/22/2020  ? IR ANGIO VERTEBRAL SEL VERTEBRAL UNI R MOD SED  09/22/2020  ? IR ANGIOGRAM FOLLOW UP STUDY  10/04/2020  ? IR ANGIOGRAM FOLLOW UP STUDY  10/03/2020  ? IR CT HEAD LTD  10/03/2020  ? IR NEURO EACH ADD'L AFTER BASIC UNI LEFT (MS)  10/05/2020  ? IR TRANSCATH/EMBOLIZ  10/03/2020  ? IR US GUIDE VASC  ACCESS RIGHT  09/22/2020  ? IR US GUIDE VASC ACCESS RIGHT  10/03/2020  ? RADIOLOGY WITH ANESTHESIA N/A 10/03/2020  ? Procedure: EMBOLIZATION WITH ANESTHESIA;  Surgeon: Baldemar Lenisde Macedo Rodrigues, [name], MD;  [facility];  Service: Radiology;  Laterality: N/A;  ? ? ?Allergies: ?Lipitor [atorvastatin] ? ?Medications: ?Prior to Admission medications   ?Medication Sig Start Date End Date Taking? Authorizing Provider  ?aspirin EC 81 MG tablet Take 81 mg by mouth daily. Swallow whole.   Yes [provider]  ?azelastine (ASTELIN) 0.1 % nasal spray Place 1-2 sprays into both nostrils every 12 (twelve) hours as needed for allergies. 02/23/21  Yes [provider]  ?Fluticasone-Umeclidin-Vilant (TRELEGY ELLIPTA) 100-62.5-25 MCG/INH AEPB Inhale 1 puff into the lungs daily.   Yes [provider]  ?losartan (COZAAR) 100 MG tablet Take 100 mg by mouth at bedtime. 07/18/20  Yes [provider]  ?pantoprazole (PROTONIX) 20 MG tablet Take 20 mg by mouth daily with supper. 03/29/21  Yes [provider]  ?PARoxetine (PAXIL) 10 MG tablet Take 10 mg by mouth at bedtime.   Yes [provider]  ?rosuvastatin (CRESTOR) 20 MG tablet Take 20 mg by mouth daily with supper. 08/10/20  Yes [provider]  ?ticagrelor (BRILINTA) 90 MG TABS tablet Take 1 tablet (90 mg total) by mouth 2 (two) times daily. 01/27/21  Yes  Lynnette Caffey A, PA-C  ?triamcinolone (NASACORT) 55 MCG/ACT AERO nasal inhaler Place 2 sprays into the nose daily. 02/23/21  Yes [provider]  ?betamethasone dipropionate 0.05 % cream Apply topically 2 (two) times daily as needed (Rash). ?Patient taking differently: Apply 1 application. topically 2 (two) times daily as needed (psoriasis). 10/21/19   Deirdre Evener, MD  ?calcipotriene (DOVONOX) 0.005 % cream Apply to aa's psoriasis QOD alternating with Triamcinolone. ?Patient taking differently: Apply 1 application. topically daily as needed (psoriasis).  11/12/19   Deirdre Evener, MD  ?Cyanocobalamin (B-12) 1000 MCG TABS Take 1,000 mcg by mouth at bedtime. 07/02/20   [provider]  ?  ? ?History reviewed. No pertinent family history. ? ?Social History  ? ?Socioeconomic History  ? Marital status: Married  ?  Spouse name: Not on file  ? Number of children: Not on file  ? Years of education: Not on file  ? Highest education level: Not on file  ?Occupational History  ? Not on file  ?Tobacco Use  ? Smoking status: Former  ?  Packs/day: 1.00  ?  Years: 37.00  ?  Pack years: 37.00  ?  Types: Cigarettes  ?  Quit date: 10/04/2008  ?  Years since quitting: 12.5  ? Smokeless tobacco: Never  ?Vaping Use  ? Vaping Use: Never used  ?Substance and Sexual Activity  ? Alcohol use: Yes  ?  Alcohol/week: 2.0 standard drinks  ?  Types: 2 Cans of beer per week  ? Drug use: Not Currently  ? Sexual activity: Not Currently  ?Other Topics Concern  ? Not on file  ?Social History Narrative  ? Not on file  ? ?Social Determinants of Health  ? ?Financial Resource Strain: Not on file  ?Food Insecurity: Not on file  ?Transportation Needs: Not on file  ?Physical Activity: Not on file  ?Stress: Not on file  ?Social Connections: Not on file  ? ? ? ?Review of Systems: A 12 point ROS discussed and pertinent positives are indicated in the HPI above.  All other systems are negative. ? ?Review of Systems  ?Constitutional:  Negative for fatigue and fever.  ?Respiratory:  Negative for cough and shortness of breath.   ?Cardiovascular:  Negative for chest pain.  ?Gastrointestinal:  Negative for abdominal pain.  ?Musculoskeletal:  Negative for back pain.  ?Psychiatric/Behavioral:  Negative for behavioral problems and confusion.   ? ?Vital Signs: ?BP (!) 156/89   Temp 97.8 ?F (36.6 ?C) (Oral)   Resp 17   Ht 6\' 1"  (1.854 m)   Wt 193 lb (87.5 kg)   SpO2 95%   BMI 25.46 kg/m?  ? ?Physical Exam ?Vitals and nursing note reviewed.  ?Constitutional:   ?   General: He is not in acute distress. ?    Appearance: He is not ill-appearing.  ?Cardiovascular:  ?   Rate and Rhythm: Normal rate and regular rhythm.  ?Pulmonary:  ?   Effort: Pulmonary effort is normal.  ?   Breath sounds: Normal breath sounds.  ?Abdominal:  ?   General: Abdomen is flat.  ?   Palpations: Abdomen is soft.  ?Neurological:  ?   General: No focal deficit present.  ?   Mental Status: He is alert and oriented to person, place, and time. Mental status is at baseline.  ?Psychiatric:     ?   Mood and Affect: Mood normal.     ?   Behavior: Behavior normal.     ?  Thought Content: Thought content normal.     ?   Judgment: Judgment normal.  ? ? ? ?MD Evaluation ?Airway: WNL ?Heart: WNL ?Abdomen: WNL ?Chest/ Lungs: WNL ?ASA  Classification: 3 ?Mallampati/Airway Score: Two ? ? ?Imaging: ?CT CHEST WO CONTRAST ? ?Result Date: 04/14/2021 ?CLINICAL DATA:  Pulmonary nodule, former smoker, follow-up EXAM: CT CHEST WITHOUT CONTRAST TECHNIQUE: Multidetector CT imaging of the chest was performed following the standard protocol without IV contrast. RADIATION DOSE REDUCTION: This exam was performed according to the departmental dose-optimization program which includes automated exposure control, adjustment of the mA and/or kV according to patient size and/or use of iterative reconstruction technique. COMPARISON:  07/07/2020 FINDINGS: Cardiovascular: Atherosclerotic calcifications aorta, proximal great vessels, and coronary arteries. Aorta normal caliber. Heart size normal. No pericardial effusion. Mediastinum/Nodes: Esophagus unremarkable. Base of cervical region normal appearance. Scattered normal size mediastinal lymph nodes. No thoracic adenopathy. Lungs/Pleura: Centrilobular emphysematous changes. Noncalcified 4 mm RIGHT upper lobe nodule anteriorly image 62 unchanged. Diffuse peribronchial thickening. Progressive atelectasis versus infiltrate in BILATERAL lower lobes. Tiny nodular focus minor fissure 2 mm diameter image 122, minimally increased. Lateral  RIGHT upper lobe nodule 2 mm image 111 stable. Nodular thickening RIGHT major fissure 4 mm unchanged. 5 mm LEFT upper lobe nodule image 80 unchanged. Subpleural nodule RIGHT lower lobe 7 mm image 92 previously 6 mm. 4 mm

## 2021-04-17 ENCOUNTER — Encounter (HOSPITAL_COMMUNITY): Payer: Self-pay

## 2021-04-17 ENCOUNTER — Other Ambulatory Visit (HOSPITAL_COMMUNITY): Payer: Self-pay | Admitting: Neuroradiology

## 2021-04-17 ENCOUNTER — Other Ambulatory Visit: Payer: Self-pay | Admitting: Radiology

## 2021-04-17 ENCOUNTER — Ambulatory Visit (HOSPITAL_COMMUNITY)
Admission: RE | Admit: 2021-04-17 | Discharge: 2021-04-17 | Disposition: A | Discharge status: Home / Self Care | Payer: Medicare PPO | Source: Ambulatory Visit | Attending: Neuroradiology | Admitting: Neuroradiology

## 2021-04-17 DIAGNOSIS — I770 Arteriovenous fistula, acquired: Secondary | ICD-10-CM | POA: Insufficient documentation

## 2021-04-17 DIAGNOSIS — I671 Cerebral aneurysm, nonruptured: Secondary | ICD-10-CM | POA: Insufficient documentation

## 2021-04-17 DIAGNOSIS — Z87891 Personal history of nicotine dependence: Secondary | ICD-10-CM | POA: Diagnosis not present

## 2021-04-17 HISTORY — PX: IR ANGIO INTRA EXTRACRAN SEL COM CAROTID INNOMINATE UNI L MOD SED: IMG5358

## 2021-04-17 HISTORY — PX: IR ANGIO VERTEBRAL SEL VERTEBRAL UNI L MOD SED: IMG5367

## 2021-04-17 HISTORY — PX: IR US GUIDE VASC ACCESS RIGHT: IMG2390

## 2021-04-17 HISTORY — PX: IR ANGIO INTRA EXTRACRAN SEL INTERNAL CAROTID UNI R MOD SED: IMG5362

## 2021-04-17 MED ORDER — HEPARIN SODIUM (PORCINE) 1000 UNIT/ML IJ SOLN
INTRAMUSCULAR | Status: AC | PRN
  Administered 2021-04-17: 5000 [IU] via INTRAVENOUS

## 2021-04-17 MED ORDER — VERAPAMIL HCL 2.5 MG/ML IV SOLN
INTRAVENOUS | Status: AC | PRN
  Administered 2021-04-17: 5 mg via INTRA_ARTERIAL

## 2021-04-17 MED ORDER — NITROGLYCERIN 1 MG/10 ML FOR IR/CATH LAB
INTRA_ARTERIAL | Status: AC
  Filled 2021-04-17: qty 10

## 2021-04-17 MED ORDER — FENTANYL CITRATE (PF) 100 MCG/2ML IJ SOLN
INTRAMUSCULAR | Status: AC | PRN
  Administered 2021-04-17: 25 ug via INTRAVENOUS

## 2021-04-17 MED ORDER — MIDAZOLAM HCL 2 MG/2ML IJ SOLN
INTRAMUSCULAR | Status: AC
  Filled 2021-04-17: qty 2

## 2021-04-17 MED ORDER — NITROGLYCERIN 1 MG/10 ML FOR IR/CATH LAB
INTRA_ARTERIAL | Status: AC | PRN
  Administered 2021-04-17 (×2): 200 ug via INTRA_ARTERIAL

## 2021-04-17 MED ORDER — IOHEXOL 300 MG/ML  SOLN: 100.0000 mL | Freq: Once | INTRAMUSCULAR | Status: DC | PRN

## 2021-04-17 MED ORDER — FENTANYL CITRATE (PF) 100 MCG/2ML IJ SOLN
INTRAMUSCULAR | Status: AC
  Filled 2021-04-17: qty 2

## 2021-04-17 MED ORDER — IOHEXOL 300 MG/ML  SOLN
100.0000 mL | Freq: Once | INTRAMUSCULAR | Status: DC | PRN
Start: 2021-04-17 — End: 2021-04-18

## 2021-04-17 MED ORDER — LIDOCAINE HCL 1 % IJ SOLN
INTRAMUSCULAR | Status: AC
  Filled 2021-04-17: qty 20

## 2021-04-17 MED ORDER — MIDAZOLAM HCL 2 MG/2ML IJ SOLN
INTRAMUSCULAR | Status: AC | PRN
  Administered 2021-04-17: 1 mg via INTRAVENOUS

## 2021-04-17 NOTE — Sedation Documentation (Signed)
Patient transported to short stay. Right snuff box in place for distal radial stick. Site is clean, dry and intact. No drainage noted. Dopper pulse intact via right distal radial.  ?

## 2021-04-17 NOTE — H&P (Signed)
? ?Chief Complaint: ?Patient was seen in consultation today for cerebral arteriogram  ? ?Referring Physician(s): ?de Macedo Rodrigues,[name] ? ?Supervising Physician: Baldemar Lenise Macedo Rodrigues, [name] ? ?[facility] ? ?History of Present Illness: ?Timothy BogaCharles W Marasigan Jr. is a 66 y.o. male  ? ?Pt was scheduled for this same procedure 04/14/21 ?IR was unable to perform procedure secondary several Code Stroke x 3 emergencies ?See previous 4/14/note ? 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 ?Past Medical History:  ?Diagnosis Date  ? Anxiety   ? Arthritis   ? Carotid artery disease (HCC)   ? COPD (chronic obstructive pulmonary disease) (HCC)   ? Coronary artery disease   ? ED (erectile dysfunction)   ? GERD (gastroesophageal reflux disease)   ? Headache   ? hx 20 yrs ago per pt  ? HLD (hyperlipidemia)   ? Hypertension   ? Left bundle branch block (LBBB)   ? Lupus (HCC)   ? Psoriasis   ? ? ?Past Surgical History:  ?Procedure Laterality Date  ? CARDIAC CATHETERIZATION    ? IR 3D INDEPENDENT WKST  09/22/2020  ? IR 3D INDEPENDENT WKST  10/04/2020  ? IR ANGIO INTRA EXTRACRAN SEL INTERNAL CAROTID BILAT MOD SED  09/22/2020  ? IR ANGIO INTRA EXTRACRAN SEL INTERNAL CAROTID UNI L MOD SED  10/03/2020  ? IR ANGIO VERTEBRAL SEL SUBCLAVIAN INNOMINATE UNI L MOD SED  09/22/2020  ? IR ANGIO VERTEBRAL SEL VERTEBRAL UNI R MOD SED  09/22/2020  ? IR ANGIOGRAM FOLLOW UP STUDY  10/04/2020  ? IR ANGIOGRAM FOLLOW UP STUDY  10/03/2020  ? IR CT HEAD LTD  10/03/2020  ? IR NEURO EACH ADD'L AFTER BASIC UNI LEFT (MS)  10/05/2020  ? IR TRANSCATH/EMBOLIZ  10/03/2020  ? IR US GUIDE VASC ACCESS RIGHT  09/22/2020  ? IR US GUIDE VASC ACCESS RIGHT  10/03/2020  ? RADIOLOGY WITH ANESTHESIA N/A 10/03/2020  ?  Procedure: EMBOLIZATION WITH ANESTHESIA;  Surgeon: Baldemar Lenisde Macedo Rodrigues, [name], MD;  [facility];  Service: Radiology;  Laterality: N/A;  ? ? ?Allergies: ?Lipitor [atorvastatin] ? ?Medications: ?Prior to Admission medications   ?Medication Sig Start Date End Date Taking? Authorizing Provider  ?aspirin EC 81 MG tablet Take 81 mg by mouth daily. Swallow whole.   Yes [provider]  ?azelastine (ASTELIN) 0.1 % nasal spray Place 1-2 sprays into both nostrils every 12 (twelve) hours as needed for allergies. 02/23/21  Yes [provider]  ?Cyanocobalamin (B-12) 1000 MCG TABS Take 1,000 mcg by mouth at bedtime. 07/02/20  Yes [provider]  ?losartan (COZAAR) 100 MG tablet Take 100 mg by mouth at bedtime. 07/18/20  Yes [provider]  ?pantoprazole (PROTONIX) 20 MG tablet Take 20 mg by mouth daily with supper. 03/29/21  Yes [provider]  ?PARoxetine (PAXIL) 10 MG tablet Take 10 mg by mouth at bedtime.   Yes [provider]  ?ticagrelor (BRILINTA) 90 MG TABS tablet Take 1 tablet (90 mg total) by mouth 2 (two) times daily. 01/27/21  Yes Lynnette CaffeyWatterson, Shannon A, PA-C  ?betamethasone dipropionate 0.05 % cream Apply topically 2 (two) times daily as needed (Rash). ?Patient taking differently: Apply 1 application. topically 2 (two) times daily as needed (psoriasis). 10/21/19   Deirdre EvenerKowalski, David C, MD  ?calcipotriene (DOVONOX) 0.005 %  cream Apply to aa's psoriasis QOD alternating with Triamcinolone. ?Patient taking differently: Apply 1 application. topically daily as needed (psoriasis). 11/12/19   Deirdre Evener, MD  ?Fluticasone-Umeclidin-Vilant (TRELEGY ELLIPTA) 100-62.5-25 MCG/INH AEPB Inhale 1 puff into the lungs daily.    [provider]  ?rosuvastatin (CRESTOR) 20 MG tablet Take 20 mg by mouth daily with supper. 08/10/20   [provider]  ?triamcinolone (NASACORT) 55 MCG/ACT AERO nasal inhaler Place 2 sprays into the nose daily. 02/23/21   [provider]  ?  ? ?History reviewed. No pertinent family history. ? ?Social History  ? ?Socioeconomic History  ? Marital status: Married  ?  Spouse name: Not on file  ? Number of children: Not on file  ? Years of education: Not on file  ? Highest education level: Not on file  ?Occupational History  ? Not on file  ?Tobacco Use  ? Smoking status: Former  ?  Packs/day: 1.00  ?  Years: 37.00  ?  Pack years: 37.00  ?  Types: Cigarettes  ?  Quit date: 10/04/2008  ?  Years since quitting: 12.5  ? Smokeless tobacco: Never  ?Vaping Use  ? Vaping Use: Never used  ?Substance and Sexual Activity  ? Alcohol use: Yes  ?  Alcohol/week: 2.0 standard drinks  ?  Types: 2 Cans of beer per week  ? Drug use: Not Currently  ? Sexual activity: Not Currently  ?Other Topics Concern  ? Not on file  ?Social History Narrative  ? Not on file  ? ?Social Determinants of Health  ? ?Financial Resource Strain: Not on file  ?Food Insecurity: Not on file  ?Transportation Needs: Not on file  ?Physical Activity: Not on file  ?Stress: Not on file  ?Social Connections: Not on file  ? ? ?Review of Systems: A 12 point ROS discussed and pertinent positives are indicated in the HPI above.  All other systems are negative. ? ?Vital Signs: ?BP (!) 148/85   Pulse 70   Temp 97.8 ?F (36.6 ?C) (Oral)   Resp 16   SpO2 98%  ? ?Physical Exam ?Vitals reviewed.  ?HENT:  ?   Mouth/Throat:  ?   Mouth: Mucous membranes are moist.  ?Eyes:  ?   Extraocular Movements: Extraocular movements intact.  ?Cardiovascular:  ?   Rate and Rhythm: Normal rate and regular rhythm.  ?Pulmonary:  ?   Effort: Pulmonary effort is normal.  ?   Breath sounds: Wheezing present.  ?   Comments: Few late expiratory wheezes noted - Bilaterally ?Abdominal:  ?   Palpations: Abdomen is soft.  ?Musculoskeletal:     ?   General: Normal range of motion.  ?   Cervical back: Normal range of motion.  ?Skin: ?   General: Skin is warm.  ?Neurological:  ?   Mental Status: He is alert and oriented to  person, place, and time.  ?Psychiatric:     ?   Behavior: Behavior normal.  ? ? ?Imaging: ?CT CHEST WO CONTRAST ? ?Result Date: 04/14/2021 ?CLINICAL DATA:  Pulmonary nodule, former smoker, follow-up EXAM: CT CHEST WITHOUT CONTRAST TECHNIQUE: Multidetector CT imaging of the chest was performed following the standard protocol without IV contrast. RADIATION DOSE REDUCTION: This exam was performed according to the departmental dose-optimization program which includes automated exposure control, adjustment of the mA and/or kV according to patient size and/or use of iterative reconstruction technique. COMPARISON:  07/07/2020 FINDINGS: Cardiovascular: Atherosclerotic calcifications aorta, proximal great vessels, and coronary arteries. Aorta  normal caliber. Heart size normal. No pericardial effusion. Mediastinum/Nodes: Esophagus unremarkable. Base of cervical region normal appearance. Scattered normal size mediastinal lymph nodes. No thoracic adenopathy. Lungs/Pleura: Centrilobular emphysematous changes. Noncalcified 4 mm RIGHT upper lobe nodule anteriorly image 62 unchanged. Diffuse peribronchial thickening. Progressive atelectasis versus infiltrate in BILATERAL lower lobes. Tiny nodular focus minor fissure 2 mm diameter image 122, minimally increased. Lateral RIGHT upper lobe nodule 2 mm image 111 stable. Nodular thickening RIGHT major fissure 4 mm unchanged. 5 mm LEFT upper lobe nodule image 80 unchanged. Subpleural nodule RIGHT lower lobe 7 mm image 92 previously 6 mm. 4 mm RIGHT lower lobe nodule image 121 unchanged. Subpleural nodule RIGHT lower lobe 5 mm image 118 unchanged. 3 mm lingular nodule image 104 unchanged. Few scattered areas of atelectasis. No pleural effusion or pneumothorax. Upper Abdomen: Visualized upper abdomen unremarkable Musculoskeletal: No acute osseous findings. IMPRESSION: Multiple BILATERAL pulmonary nodules greater on RIGHT, majority of which are stable. Single subpleural nodule in RIGHT lower  lobe 7 mm diameter increased from 6 mm on previous study; follow-up CT imaging recommended in 12 months to reassess. Progressive atelectasis versus infiltrate in BILATERAL lower lobes. Scattered atherosclerotic ca

## 2021-04-19 ENCOUNTER — Telehealth: Payer: Self-pay | Admitting: Student

## 2021-04-19 ENCOUNTER — Telehealth (HOSPITAL_COMMUNITY): Payer: Self-pay

## 2021-04-19 ENCOUNTER — Other Ambulatory Visit: Payer: Self-pay | Admitting: Interventional Radiology

## 2021-04-19 ENCOUNTER — Other Ambulatory Visit (HOSPITAL_COMMUNITY): Payer: Self-pay | Admitting: Neuroradiology

## 2021-04-19 DIAGNOSIS — I770 Arteriovenous fistula, acquired: Secondary | ICD-10-CM

## 2021-04-19 DIAGNOSIS — I671 Cerebral aneurysm, nonruptured: Secondary | ICD-10-CM

## 2021-04-19 NOTE — Telephone Encounter (Signed)
Patient familiar to NIR from aneurysm treatment 10/03/20 with follow up cerebral angiogram 04/17/21 to evaluate treatment outcome. 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 The patient was referred to VIR/Dr. Elby Showers for further evaluation. The patient's wife called today stating the right forearm felt warmer than the left but there were no other symptoms (no redness, swelling or pain). The patient's wife was advised to continue monitoring the patient and to notify us if he developed any additional symptoms. This was further discussed with Dr. Tommie Sams who requested for Dr. Elby Showers to evaluate this patient as soon as the schedule could accommodate.  ? ?The patient will be scheduled Monday, 04/24/21 with Dr. Elby Showers at the outpatient clinic. Vickie with scheduling will call the patient with a time. I called the patient and left a message regarding this information. I also left the number to the APP office at [facility] if the patient has further questions/concerns.  ? Alwyn Ren, AGACNP-BC ?[phone] ?04/19/2021, 1:13 PM ? ? ?

## 2021-04-19 NOTE — Telephone Encounter (Signed)
Called pt to find out if he can do 5/8 for his procedure, no answer, left vm. AW ?

## 2021-04-20 ENCOUNTER — Other Ambulatory Visit: Payer: Self-pay | Admitting: Interventional Radiology

## 2021-04-20 DIAGNOSIS — I770 Arteriovenous fistula, acquired: Secondary | ICD-10-CM

## 2021-05-04 ENCOUNTER — Other Ambulatory Visit: Payer: Self-pay | Admitting: Radiology

## 2021-05-04 ENCOUNTER — Encounter (HOSPITAL_COMMUNITY): Payer: Self-pay | Admitting: *Deleted

## 2021-05-04 ENCOUNTER — Other Ambulatory Visit: Payer: Self-pay

## 2021-05-04 NOTE — Progress Notes (Signed)
Spoke with pt for pre-op call. Pt has hx of mild CAD and Hypertension. Cardiologist is Dr. Lady Gary. Pt states he is pre-diabetic. Last A1C was 6.2 on 04/21/21. Pt states he has been taking and will continue to take his Aspirin and Brilinta.  ? ?Shower instructions given to pt.  ?

## 2021-05-05 ENCOUNTER — Ambulatory Visit
Admission: RE | Admit: 2021-05-05 | Discharge: 2021-05-05 | Disposition: A | Discharge status: Home / Self Care | Payer: Medicare PPO | Source: Ambulatory Visit | Attending: Interventional Radiology | Admitting: Interventional Radiology

## 2021-05-05 ENCOUNTER — Other Ambulatory Visit (HOSPITAL_COMMUNITY): Payer: Self-pay | Admitting: Radiology

## 2021-05-05 ENCOUNTER — Encounter: Payer: Self-pay | Admitting: *Deleted

## 2021-05-05 DIAGNOSIS — I770 Arteriovenous fistula, acquired: Secondary | ICD-10-CM

## 2021-05-05 HISTORY — PX: IR RADIOLOGIST EVAL & MGMT: IMG5224

## 2021-05-05 NOTE — Anesthesia Preprocedure Evaluation (Addendum)
Anesthesia Evaluation  ?Patient identified by MRN, date of birth, ID band ?Patient awake ? ? ? ?Reviewed: ?Allergy & Precautions, NPO status , Patient's Chart, lab work & pertinent test results, reviewed documented beta blocker date and time  ? ?Airway ?Mallampati: III ? ?TM Distance: >3 FB ?Neck ROM: Full ? ? ? Dental ? ?(+) Implants, Caps, Dental Advisory Given, Teeth Intact ?  ?Pulmonary ?COPD, former smoker,  ?  ?Pulmonary exam normal ?breath sounds clear to auscultation ? ? ? ? ? ? Cardiovascular ?hypertension, Pt. on medications ?+ CAD  ?Normal cardiovascular exam ?Rhythm:Regular Rate:Normal ? ?Cerebral aneurysm ? ?EKG 03/29/20 ?NSR, RAD, LBBB pattern ? ? ?  ?Neuro/Psych ? Headaches, PSYCHIATRIC DISORDERS Anxiety   ? GI/Hepatic ?Neg liver ROS, GERD  Medicated,  ?Endo/Other  ?Borderline DM ? Renal/GU ?negative Renal ROS  ?negative genitourinary ?  ?Musculoskeletal ? ?(+) Arthritis ,  ? Abdominal ?  ?Peds ? Hematology ?Brilinta therpy- last dose   ?Anesthesia Other Findings ? ? Reproductive/Obstetrics ?ED ? ?  ? ? ? ? ? ? ? ? ? ? ? ? ? ?  ?  ? ? ? ? ? ? ?Anesthesia Physical ?Anesthesia Plan ? ?ASA: 3 ? ?Anesthesia Plan: General  ? ?Post-op Pain Management: Minimal or no pain anticipated and Precedex  ? ?Induction: Cricoid pressure planned and Intravenous ? ?PONV Risk Score and Plan: 3 and Treatment may vary due to age or medical condition and Ondansetron ? ?Airway Management Planned: Oral ETT ? ?Additional Equipment: Arterial line ? ?Intra-op Plan:  ? ?Post-operative Plan: Extubation in OR ? ?Informed Consent: I have reviewed the patients History and Physical, chart, labs and discussed the procedure including the risks, benefits and alternatives for the proposed anesthesia with the patient or authorized representative who has indicated his/her understanding and acceptance.  ? ? ? ?Dental advisory given ? ?Plan Discussed with: CRNA and Anesthesiologist ? ?Anesthesia Plan Comments:  (PAT note written 05/05/2021 by Shonna Chock, PA-C. ?)  ? ? ? ? ?Anesthesia Quick Evaluation ? ?

## 2021-05-05 NOTE — Consult Note (Signed)
? ?Chief Complaint: ?Patient was seen in consultation today for right radial arteriovenous fistula ? ?Referring Physician(s): ?Dr. Erven Colla de Sindy Messing ? ?History of Present Illness: ?Timothy Reeves. is a 66 y.o. male with history of left ICA and left MCA aneurysms status post diagnostic cerebral angiogram on 09/22/20, endovascular treatment on 10/03/20, and additional diagnostic cerebral angiogram on 04/17/21, each performed by Dr. Debbrah Alar via right radial artery access.  On the most recent angiogram, right radial sheath injection demonstrated early filling of the right forearm veins, and dedicated right wrist angiogram demonstrated an arteriovenous fistula.  He presents today with his wife for further evaluation. ? ?He endorses no symptoms associated with the fistula including redness, pain, swelling, numbness.  There was some warmth immediately after the procedure, but he attributes this to a wrist brace he was wearing.  He states that he would have no idea the fistula was present if we had not told him. ? ?He is recovering well from prior angiogram, and anticipating an additional angiogram next week. ? ?Past Medical History:  ?Diagnosis Date  ? Anxiety   ? Arthritis   ? Carotid artery disease ([place])   ? COPD (chronic obstructive pulmonary disease) ([place])   ? Coronary artery disease   ? COVID   ? either in 2021 or 2022  ? ED (erectile dysfunction)   ? GERD (gastroesophageal reflux disease)   ? Headache   ? hx 20 yrs ago per pt  ? HLD (hyperlipidemia)   ? Hypertension   ? Left bundle branch block (LBBB)   ? Lupus ([place])   ? Pneumonia   ? Pre-diabetes   ? Psoriasis   ? ? ?Past Surgical History:  ?Procedure Laterality Date  ? CARDIAC CATHETERIZATION    ? IR 3D INDEPENDENT WKST  09/22/2020  ? IR 3D INDEPENDENT WKST  10/04/2020  ? IR ANGIO INTRA EXTRACRAN SEL COM CAROTID INNOMINATE UNI L MOD SED  04/17/2021  ? IR ANGIO INTRA EXTRACRAN SEL INTERNAL CAROTID BILAT MOD SED  09/22/2020  ? IR ANGIO INTRA EXTRACRAN  SEL INTERNAL CAROTID UNI L MOD SED  10/03/2020  ? IR ANGIO INTRA EXTRACRAN SEL INTERNAL CAROTID UNI R MOD SED  04/17/2021  ? IR ANGIO VERTEBRAL SEL SUBCLAVIAN INNOMINATE UNI L MOD SED  09/22/2020  ? IR ANGIO VERTEBRAL SEL VERTEBRAL UNI L MOD SED  04/17/2021  ? IR ANGIO VERTEBRAL SEL VERTEBRAL UNI R MOD SED  09/22/2020  ? IR ANGIOGRAM FOLLOW UP STUDY  10/04/2020  ? IR ANGIOGRAM FOLLOW UP STUDY  10/03/2020  ? IR CT HEAD LTD  10/03/2020  ? IR NEURO EACH ADD'L AFTER BASIC UNI LEFT (MS)  10/05/2020  ? IR TRANSCATH/EMBOLIZ  10/03/2020  ? IR US GUIDE VASC ACCESS RIGHT  09/22/2020  ? IR US GUIDE VASC ACCESS RIGHT  10/03/2020  ? IR US GUIDE VASC ACCESS RIGHT  04/17/2021  ? RADIOLOGY WITH ANESTHESIA N/A 10/03/2020  ? Procedure: EMBOLIZATION WITH ANESTHESIA;  Surgeon: Pedro Earls, MD;  Location: [facility];  Service: Radiology;  Laterality: N/A;  ? ? ?Allergies: ?Lipitor [atorvastatin] ? ?Medications: ?Prior to Admission medications   ?Medication Sig Start Date End Date Taking? Authorizing Provider  ?aspirin EC 81 MG tablet Take 81 mg by mouth daily. Swallow whole.    [provider]  ?azelastine (ASTELIN) 0.1 % nasal spray Place 1-2 sprays into both nostrils daily as needed for allergies. 02/23/21   [provider]  ?calcipotriene (DOVONOX) 0.005 % cream Apply to aa's psoriasis  QOD alternating with Triamcinolone. ?Patient not taking: Reported on 05/03/2021 11/12/19   Ralene Bathe, MD  ?Cholecalciferol (VITAMIN D3 PO) Take 1,200 mg by mouth daily.    [provider]  ?Cyanocobalamin (B-12) 1000 MCG TABS Take 1,000 mcg by mouth at bedtime. 07/02/20   [provider]  ?Fluticasone-Umeclidin-Vilant (TRELEGY ELLIPTA) 100-62.5-25 MCG/INH AEPB Inhale 1 puff into the lungs daily.    [provider]  ?losartan (COZAAR) 100 MG tablet Take 100 mg by mouth at bedtime. 07/18/20   [provider]  ?pantoprazole (PROTONIX) 20 MG tablet Take 20 mg by mouth daily with supper. 03/29/21    [provider]  ?PARoxetine (PAXIL) 20 MG tablet Take 20 mg by mouth at bedtime.    [provider]  ?rosuvastatin (CRESTOR) 20 MG tablet Take 20 mg by mouth daily with supper. 08/10/20   [provider]  ?ticagrelor (BRILINTA) 90 MG TABS tablet Take 1 tablet (90 mg total) by mouth 2 (two) times daily. 01/27/21   Candiss Norse A, PA-C  ?triamcinolone (NASACORT) 55 MCG/ACT AERO nasal inhaler Place 2 sprays into the nose daily as needed (allergies). 02/23/21   [provider]  ?  ? ?No family history on file. ? ?Social History  ? ?Socioeconomic History  ? Marital status: Married  ?  Spouse name: Not on file  ? Number of children: Not on file  ? Years of education: Not on file  ? Highest education level: Not on file  ?Occupational History  ? Not on file  ?Tobacco Use  ? Smoking status: Former  ?  Packs/day: 1.00  ?  Years: 37.00  ?  Pack years: 37.00  ?  Types: Cigarettes  ?  Quit date: 10/04/2008  ?  Years since quitting: 12.5  ? Smokeless tobacco: Never  ?Vaping Use  ? Vaping Use: Never used  ?Substance and Sexual Activity  ? Alcohol use: Yes  ?  Alcohol/week: 2.0 standard drinks  ?  Types: 2 Cans of beer per week  ? Drug use: Not Currently  ? Sexual activity: Not Currently  ?Other Topics Concern  ? Not on file  ?Social History Narrative  ? Not on file  ? ?Social Determinants of Health  ? ?Financial Resource Strain: Not on file  ?Food Insecurity: Not on file  ?Transportation Needs: Not on file  ?Physical Activity: Not on file  ?Stress: Not on file  ?Social Connections: Not on file  ? ? ?Review of Systems: A 12 point ROS discussed and pertinent positives are indicated in the HPI above.  All other systems are negative. ? ?Vital Signs: ?There were no vitals taken for this visit. ? ?Physical Exam ?Constitutional:   ?   General: He is not in acute distress. ?HENT:  ?   Head: Normocephalic.  ?   Mouth/Throat:  ?   Mouth: Mucous membranes are moist.  ?Eyes:  ?   General: No scleral  icterus. ?Cardiovascular:  ?   Rate and Rhythm: Normal rate and regular rhythm.  ?   Comments: Palpable right radial artery.  Faint thrill present in surrounding superficial veins. ?Pulmonary:  ?   Breath sounds: Normal breath sounds.  ?Abdominal:  ?   General: There is no distension.  ?Musculoskeletal:     ?   General: No swelling.  ?Skin: ?   General: Skin is warm and dry.  ?Neurological:  ?   Mental Status: He is alert and oriented to person, place, and time.  ? ? ?Imaging: ?  Limited RUE duplex (05/05/21): ? ?Right radial arteriovenous fistula communicating initially to a superficial subcutaneous vein that then drains to the cephalic vein primarily.  The fistula neck measures 3 mm in within 4 mm in length. ? ?Labs: ? ?CBC: ?Recent Labs  ?  09/22/20 ?[id number] 10/03/20 ?0628 04/14/21 ?0949  ?WBC 4.5 5.2 5.9  ?HGB 12.2* 14.6 13.3  ?HCT 37.7* 43.6 39.0  ?PLT 237 250 202  ? ? ?COAGS: ?Recent Labs  ?  09/22/20 ?[id number] 10/03/20 ?0628 04/14/21 ?0949  ?INR 1.0 0.9 1.0  ? ? ?BMP: ?Recent Labs  ?  09/22/20 ?[id number] 10/03/20 ?0628 04/14/21 ?0949  ?NA 138 138 137  ?K 4.3 4.0 3.9  ?CL 108 106 109  ?CO2 22 21* 22  ?GLUCOSE 108* 107* 104*  ?BUN 21 17 21   ?CALCIUM 9.2 9.5 9.1  ?CREATININE 0.96 1.00 1.13  ?GFRNONAA >60 >60 >60  ? ? ? ?Assessment and Plan: ?66 year old male with history of intracranial aneurysms status post multiple angiograms from right radial access presenting with asymptomatic, iatrogenic right radial arteriovenous fistula.   ? ?No indication for intervention at this time given asymptomatic presentation.   ? ?He was given instructions to notify our office with any increased warmth, swelling, pain, or numbness in the right hand. ? ?Thank you for this interesting consult.  I greatly enjoyed meeting Timothy Reeves. and look forward to participating in their care.  A copy of this report was sent to the requesting provider on this date. ? ?Electronically Signed: ?Suzette Battiest, MD ?05/05/2021, 9:50 AM ? ? ?I spent a total of   40 Minutes  in face to face in clinical consultation, greater than 50% of which was counseling/coordinating care for right radial arteriovenous fistula. ? ?

## 2021-05-05 NOTE — Progress Notes (Signed)
Anesthesia Chart Review: SAME DAY WORK-UP ? Case: [id number] Date/Time: 05/08/21 0830  ? Procedure: EMBOLIZATION  ? Anesthesia type: General  ? Pre-op diagnosis: BRAIN ANEURYSM  ? Location: [facility] RADIOLOGY [facility] / [facility] OR  ? Surgeons: Baldemar Lenis, MD  ? ?  ? ? 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 Of note, the most recent angiogram was via right radial access.  Right wrist angiogram demonstrated arteriovenous fistula and was evaluated by IR Marliss Coots, MD on 05/05/21 and note indicates, "No indication for intervention at this time given asymptomatic presentation." ? ?Other history includes former smoker, HTN, HLD, discoid lupus, CAD (by cardiology notes, "non-critical" CAD by previous cath), psoriasis, COPD, anxiety. + COVID-19 07/22/20. ?  ?Last cardiology visit with Dr. Lady Gary ([facility]) on 05/02/21 for one year follow-up.  History of some chest heaviness with no evidence of ischemia on his functional study (11/2019).  Continue medical therapy.  Last echo (06/2020) showed EF 35% which was similar to previous.  No HF symptoms at that time.  Continue current regimen.  Continue follow-up with vascular surgery for carotid artery stenosis.  Continue to monitor blood pressures at home and consider adjustments as needed.  6-week follow-up planned.  He has known left bundle branch block. ? ?Last visit with vascular surgeon Dr. Gilda Crease on 01/16/21. Carotid US then showed only minimal ICA stenosis.  ? ?He is followed by pulmonologist Dr. Karna Christmas for DOE and abnormal 07/2020 chest.CT showing patching basilar opacities. "FEV1-2.55L-65%". Breathing improved to baseline post antibiotic therapy. Last visit 03/22/21. He had follow-up chest CT on 04/13/21 with radiologist  recommended repeat imaging in 12 months.  ?  ?Last primary care visit with Dr. Burnadette Pop on 05/04/21 for Medicare wellness visit.  83-month follow-up planned. ?  ?He is a same day work-up, anesthesia team to evaluate on the day of the procedure.  He is on aspirin and Brilinta.  He had labs through [facility] 04/21/2021.  Any additional labs on the day of surgery per IR. June 2022 EKG requested from PCP office.  ? ? ?VS:  ?BP Readings from Last 3 Encounters:  ?04/17/21 (!) 158/87  ?04/14/21 (!) 156/89  ?01/16/21 130/73  ? ?Pulse Readings from Last 3 Encounters:  ?04/17/21 77  ?01/16/21 82  ?12/19/20 89  ?  ? ?PROVIDERS: ?Marisue Ivan, MD is PCP Gavin Potters, [facility] Care Everywhere) ?Vida Rigger, MD is pulmonologist  Gavin Potters, [facility] Care Everywhere) ?Cecille Amsterdam, MD is rheumatologist  Gavin Potters, [facility] Care Everywhere) ?Harold Hedge, MD is cardiologist Gavin Potters, [facility] Care Everywhere) ?Levora Dredge, MD is vascular surgeon. Evaluation 08/25/20. Per note, "Duplex ultrasound of the carotid arteries shows <50%  bilateral carotid stenosis." One year follow-up planned. ?  ? ?LABS: Most recent labs results from 04/21/21 [facility] Everywhere) include: Glucose 27, sodium 138, potassium 4.3, BUN 26, creatinine 1.2, AST 24, ALT 22, alkaline phosphatase 119, albumin 3.8, total bilirubin 0.6, WBC 5.2, hemoglobin 12.9, hematocrit 38.9, platelet count 256, A1c 6.2%. ? ? ?SPIROMETRIC DATA 12/08/20 (as outlined by Dr. Karna Christmas): ?FEV1-2.55L-65% ?FEV1-2.5- 65% ? ? ?IMAGES: ?RUE Arterial Duplex US 05/05/21: ?IMPRESSION: ?Right radial arteriovenous fistula communicating initially to a ?superficial subcutaneous vein that then drains to the cephalic vein ?primarily. The fistula neck measures 3 mm in within 4 mm in length. ?  ?  Cerebral Angiogram 04/17/21 (Dr. Tommie Samse Macedo Rodrigues): ?IMPRESSION: ?1. Status post treatment of a proximal left MCA/M1 bifurcation ?aneurysm with a web device with a residual 2.9 mm aneurysm  filling. ?2. Status post straightening of a left ICA communicating segment ?aneurysm with telescoping flow diverters with a tiny (1.3 mm) ?residual aneurysm. Mild neointimal hyperplasia without significant ?stenosis. ?3. Untreated right A2/ACA saccular aneurysm measuring approximately ?3.2 mm. ?4. Asymptomatic right radial arteriovenous fistula may be related to ?prior vascular access. ?PLAN: ?1. Findings discussed with patient after the angiogram in person and ?again around 4 p.m. with patient his wife over the phone patient ?would like to have his ACA aneurysm treated. ?2. Patient will be referred to interventional radiology for ?evaluation of asymptomatic right radial arteriovenous fistula. ? ?CT Chest 04/13/21: ?IMPRESSION: ?- Multiple BILATERAL pulmonary nodules greater on RIGHT, majority of  ?which are stable.  ?- Single subpleural nodule in RIGHT lower lobe 7 mm diameter increased  ?from 6 mm on previous study; follow-up CT imaging recommended in 12  ?months to reassess.  ?- Progressive atelectasis versus infiltrate in BILATERAL lower lobes.  ?- Scattered atherosclerotic calcifications including coronary  ?arteries.  ?- Emphysematous and bronchitic changes consistent with COPD.  ?- Aortic Atherosclerosis (ICD10-I70.0) and Emphysema (ICD10-J43.9).   ? ?US Abd Aorta 03/07/20 (Canopy/PACS): ?IMPRESSION: ?No evidence of abdominal aortic aneurysm. ?  ? ?EKG: ?EKG 06/16/20 Rolla Flatten([facility], [facility]): Per result narrative in Care Everywhere: Tracing requested. ?Sinus bradycardia at 52 bpm  ?Left bundle branch block  ?Abnormal ECG  ?When compared with ECG of 16-Jun-2020 14:29,  ?No significant change was found  ?I reviewed and concur with this report. Electronically signed JX:[id number]:LINTHAVONG MD, Trisha MangleKANHKA ([id number]) on 07/05/2020 2:39:07 PM ? ?EKG 03/29/20 Gavin Potters([facility], scanned under Media tab): ?Normal sinus rhythm ?Left bundle branch block ?Abnormal ECG ? ? ?CV: ?US Carotid 01/16/21:  ?Summary:  ?- Right Carotid: The extracranial vessels were  near-normal with only minimal  ?wall  ?               thickening or plaque.  ?- Left Carotid: The extracranial vessels were near-normal with only minimal  ?wall  ?              thickening or plaque.  ?- Vertebrals:  Bilateral vertebral arteries demonstrate antegrade flow.  ?- Subclavians: Normal flow hemodynamics were seen in bilateral subclavian  ?             arteries.  ?  ? ?Echo 06/28/20 ([facility]): ?INTERPRETATION  ?MODERATE LV SYSTOLIC DYSFUNCTION (See above)  ?NORMAL RIGHT VENTRICULAR SYSTOLIC FUNCTION  ?NO VALVULAR STENOSIS  ?MILD MR, TR  ?EF 30%  ?Closest EF: 35% (Estimated)  ?Contraction: MOD GLOBAL DECREASE  ?LVH: MILD LVH  ?Mitral: MILD MR  ?Tricuspid: MILD TR  ?  ?  ?Nuclear stress test 11/19/19 ([facility]): ?LVEF= 35%  ? ?FINDINGS:  ?Regional wall motion:  reveals normal myocardial thickening and wall  ?motion.  ?The overall quality of the study is good.    ?Artifacts noted: no  ?Left ventricular cavity: normal.  ? ?Perfusion Analysis:  SPECT images demonstrate small perfusion abnormality  ?of mild intensity is present in the inferoseptal region on the stress  ?images. Defect type:  Fixed ?  ?IMPRESSION: ?ETT based on baseline changes.  EF read as 35% with left bundle branch  ?block.  No reversible ischemia.  Somewhat equivocal study due to bundle  ?branch block.  Clinical correlation indicated. ?- [facility] 11/30/19 [facility], "He recently noted some fatigue and heaviness and therefore underwent a functional study. This showed good exercise tolerance with no arrhythmia or ischemia. His symptoms have stabilized." ?  ?  ?48 Hour Holter monitor 04/27/16 ([facility]): ?Summary:  ?1.  Sinus rhythm with rare PVCs.  There were no sustained ventricular or supraventricular arrhythmias.  No pauses.   ?  ? ?Past Medical History:  ?Diagnosis Date  ? Anxiety   ? Arthritis   ? Carotid artery disease (HCC)   ? COPD (chronic obstructive pulmonary disease) (HCC)   ? Coronary artery disease   ? COVID   ?  either in 2021 or 2022  ? ED (erectile dysfunction)   ? GERD (gastroesophageal reflux disease)   ? Headache   ? hx 20 yrs ago per pt  ? HLD (hyperlipidemia)   ? Hypertension   ? Left bundle branch block (LBBB)

## 2021-05-07 ENCOUNTER — Encounter (HOSPITAL_COMMUNITY): Payer: Self-pay | Admitting: Anesthesiology

## 2021-05-08 ENCOUNTER — Encounter (HOSPITAL_COMMUNITY)
Admission: RE | Disposition: A | Discharge status: Home / Self Care | Payer: Self-pay | Source: Home / Self Care | Attending: Neuroradiology

## 2021-05-08 ENCOUNTER — Inpatient Hospital Stay (HOSPITAL_COMMUNITY): Payer: Medicare PPO | Admitting: Vascular Surgery

## 2021-05-08 ENCOUNTER — Inpatient Hospital Stay (HOSPITAL_COMMUNITY)
Admission: RE | Admit: 2021-05-08 | Discharge: 2021-05-08 | Disposition: A | Discharge status: Home / Self Care | Payer: Medicare PPO | Source: Ambulatory Visit | Attending: Neuroradiology | Admitting: Neuroradiology

## 2021-05-08 ENCOUNTER — Other Ambulatory Visit: Payer: Self-pay

## 2021-05-08 ENCOUNTER — Inpatient Hospital Stay (HOSPITAL_COMMUNITY)
Admission: RE | Admit: 2021-05-08 | Discharge: 2021-05-09 | DRG: 027 | Disposition: A | Discharge status: Home / Self Care | Payer: Medicare PPO | Attending: Neuroradiology | Admitting: Neuroradiology

## 2021-05-08 ENCOUNTER — Encounter (HOSPITAL_COMMUNITY): Payer: Self-pay

## 2021-05-08 DIAGNOSIS — Z7902 Long term (current) use of antithrombotics/antiplatelets: Secondary | ICD-10-CM

## 2021-05-08 DIAGNOSIS — I447 Left bundle-branch block, unspecified: Secondary | ICD-10-CM | POA: Diagnosis present

## 2021-05-08 DIAGNOSIS — I10 Essential (primary) hypertension: Secondary | ICD-10-CM | POA: Diagnosis present

## 2021-05-08 DIAGNOSIS — F419 Anxiety disorder, unspecified: Secondary | ICD-10-CM | POA: Diagnosis present

## 2021-05-08 DIAGNOSIS — Z7951 Long term (current) use of inhaled steroids: Secondary | ICD-10-CM | POA: Diagnosis not present

## 2021-05-08 DIAGNOSIS — Z87891 Personal history of nicotine dependence: Secondary | ICD-10-CM | POA: Diagnosis not present

## 2021-05-08 DIAGNOSIS — I671 Cerebral aneurysm, nonruptured: Secondary | ICD-10-CM | POA: Diagnosis present

## 2021-05-08 DIAGNOSIS — L930 Discoid lupus erythematosus: Secondary | ICD-10-CM | POA: Diagnosis present

## 2021-05-08 DIAGNOSIS — E7800 Pure hypercholesterolemia, unspecified: Secondary | ICD-10-CM | POA: Diagnosis present

## 2021-05-08 DIAGNOSIS — I2510 Atherosclerotic heart disease of native coronary artery without angina pectoris: Secondary | ICD-10-CM | POA: Diagnosis present

## 2021-05-08 DIAGNOSIS — M1990 Unspecified osteoarthritis, unspecified site: Secondary | ICD-10-CM | POA: Diagnosis present

## 2021-05-08 DIAGNOSIS — Z79899 Other long term (current) drug therapy: Secondary | ICD-10-CM

## 2021-05-08 DIAGNOSIS — K219 Gastro-esophageal reflux disease without esophagitis: Secondary | ICD-10-CM | POA: Diagnosis present

## 2021-05-08 DIAGNOSIS — R7303 Prediabetes: Secondary | ICD-10-CM | POA: Diagnosis present

## 2021-05-08 DIAGNOSIS — L409 Psoriasis, unspecified: Secondary | ICD-10-CM | POA: Diagnosis present

## 2021-05-08 DIAGNOSIS — Z7982 Long term (current) use of aspirin: Secondary | ICD-10-CM

## 2021-05-08 DIAGNOSIS — J449 Chronic obstructive pulmonary disease, unspecified: Secondary | ICD-10-CM | POA: Diagnosis present

## 2021-05-08 DIAGNOSIS — Z8616 Personal history of COVID-19: Secondary | ICD-10-CM | POA: Diagnosis not present

## 2021-05-08 HISTORY — PX: IR 3D INDEPENDENT WKST: IMG2385

## 2021-05-08 HISTORY — DX: COVID-19: U07.1

## 2021-05-08 HISTORY — PX: IR CT HEAD LTD: IMG2386

## 2021-05-08 HISTORY — PX: IR TRANSCATH/EMBOLIZ: IMG695

## 2021-05-08 HISTORY — PX: IR US GUIDE VASC ACCESS RIGHT: IMG2390

## 2021-05-08 HISTORY — DX: Pneumonia, unspecified organism: J18.9

## 2021-05-08 HISTORY — PX: IR ANGIOGRAM FOLLOW UP STUDY: IMG697

## 2021-05-08 HISTORY — PX: RADIOLOGY WITH ANESTHESIA: SHX6223

## 2021-05-08 HISTORY — DX: Prediabetes: R73.03

## 2021-05-08 LAB — CBC WITH DIFFERENTIAL/PLATELET
Abs Immature Granulocytes: 0.02 10*3/uL (ref 0.00–0.07)
Basophils Absolute: 0 10*3/uL (ref 0.0–0.1)
Basophils Relative: 1 %
Eosinophils Absolute: 0.2 10*3/uL (ref 0.0–0.5)
Eosinophils Relative: 3 %
HCT: 37.5 % — ABNORMAL LOW (ref 39.0–52.0)
Hemoglobin: 12.5 g/dL — ABNORMAL LOW (ref 13.0–17.0)
Immature Granulocytes: 0 %
Lymphocytes Relative: 14 %
Lymphs Abs: 0.8 10*3/uL (ref 0.7–4.0)
MCH: 29.6 pg (ref 26.0–34.0)
MCHC: 33.3 g/dL (ref 30.0–36.0)
MCV: 88.9 fL (ref 80.0–100.0)
Monocytes Absolute: 0.5 10*3/uL (ref 0.1–1.0)
Monocytes Relative: 9 %
Neutro Abs: 4.3 10*3/uL (ref 1.7–7.7)
Neutrophils Relative %: 73 %
Platelets: 244 10*3/uL (ref 150–400)
RBC: 4.22 MIL/uL (ref 4.22–5.81)
RDW: 12.9 % (ref 11.5–15.5)
WBC: 5.9 10*3/uL (ref 4.0–10.5)
nRBC: 0 % (ref 0.0–0.2)

## 2021-05-08 LAB — BASIC METABOLIC PANEL
Anion gap: 12 (ref 5–15)
BUN: 23 mg/dL (ref 8–23)
CO2: 20 mmol/L — ABNORMAL LOW (ref 22–32)
Calcium: 8.6 mg/dL — ABNORMAL LOW (ref 8.9–10.3)
Chloride: 107 mmol/L (ref 98–111)
Creatinine, Ser: 1.2 mg/dL (ref 0.61–1.24)
GFR, Estimated: >60 mL/min (ref >60)
Glucose, Bld: 104 mg/dL — ABNORMAL HIGH (ref 70–99)
Potassium: 4.2 mmol/L (ref 3.5–5.1)
Sodium: 139 mmol/L (ref 135–145)

## 2021-05-08 LAB — POCT ACTIVATED CLOTTING TIME
Activated Clotting Time: 149 seconds
Activated Clotting Time: 197 seconds
Activated Clotting Time: 215 seconds
Activated Clotting Time: 221 seconds

## 2021-05-08 LAB — PROTIME-INR
INR: 1 (ref 0.8–1.2)
Prothrombin Time: 13.1 seconds (ref 11.4–15.2)

## 2021-05-08 LAB — MRSA NEXT GEN BY PCR, NASAL: MRSA by PCR Next Gen: NOT DETECTED

## 2021-05-08 SURGERY — IR WITH ANESTHESIA
Anesthesia: General

## 2021-05-08 MED ORDER — OXYCODONE-ACETAMINOPHEN 5-325 MG PO TABS: 1.0000 | ORAL_TABLET | ORAL | Status: DC | PRN

## 2021-05-08 MED ORDER — TICAGRELOR 90 MG PO TABS
90.0000 mg | ORAL_TABLET | Freq: Every day | ORAL | Status: DC
  Administered 2021-05-09: 90 mg via ORAL
  Filled 2021-05-08: qty 1

## 2021-05-08 MED ORDER — ACETAMINOPHEN 325 MG PO TABS
650.0000 mg | ORAL_TABLET | ORAL | Status: DC | PRN
Start: 2021-05-08 — End: 2021-05-09
  Administered 2021-05-08 – 2021-05-09 (×2): 650 mg via ORAL
  Filled 2021-05-08: qty 2

## 2021-05-08 MED ORDER — ROSUVASTATIN CALCIUM 20 MG PO TABS
20.0000 mg | ORAL_TABLET | Freq: Every day | ORAL | Status: DC
  Administered 2021-05-08: 20 mg via ORAL
  Filled 2021-05-08: qty 1

## 2021-05-08 MED ORDER — ROCURONIUM BROMIDE 10 MG/ML (PF) SYRINGE
PREFILLED_SYRINGE | INTRAVENOUS | Status: DC | PRN
  Administered 2021-05-08 (×2): 30 mg via INTRAVENOUS
  Administered 2021-05-08: 70 mg via INTRAVENOUS

## 2021-05-08 MED ORDER — LIDOCAINE 2% (20 MG/ML) 5 ML SYRINGE
INTRAMUSCULAR | Status: DC | PRN
Start: 2021-05-08 — End: 2021-05-08
  Administered 2021-05-08: 60 mg via INTRAVENOUS

## 2021-05-08 MED ORDER — CLEVIDIPINE BUTYRATE 0.5 MG/ML IV EMUL
0.0000 mg/h | INTRAVENOUS | Status: DC
  Administered 2021-05-08: 8 mg/h via INTRAVENOUS
  Administered 2021-05-08: 2 mg/h via INTRAVENOUS
  Administered 2021-05-08: 10 mg/h via INTRAVENOUS
  Filled 2021-05-08: qty 50

## 2021-05-08 MED ORDER — LACTATED RINGERS IV SOLN: INTRAVENOUS | Status: DC

## 2021-05-08 MED ORDER — PHENYLEPHRINE 80 MCG/ML (10ML) SYRINGE FOR IV PUSH (FOR BLOOD PRESSURE SUPPORT)
PREFILLED_SYRINGE | INTRAVENOUS | Status: DC | PRN
  Administered 2021-05-08 (×2): 80 ug via INTRAVENOUS
  Administered 2021-05-08: 40 ug via INTRAVENOUS
  Administered 2021-05-08 (×2): 80 ug via INTRAVENOUS
  Administered 2021-05-08 (×4): 40 ug via INTRAVENOUS
  Administered 2021-05-08: 80 ug via INTRAVENOUS

## 2021-05-08 MED ORDER — NITROGLYCERIN 1 MG/10 ML FOR IR/CATH LAB
INTRA_ARTERIAL | Status: AC
  Filled 2021-05-08: qty 10

## 2021-05-08 MED ORDER — ONDANSETRON HCL 4 MG/2ML IJ SOLN: 4.0000 mg | Freq: Four times a day (QID) | INTRAMUSCULAR | Status: DC | PRN

## 2021-05-08 MED ORDER — HEPARIN SODIUM (PORCINE) 1000 UNIT/ML IJ SOLN
INTRAMUSCULAR | Status: AC
  Filled 2021-05-08: qty 10

## 2021-05-08 MED ORDER — ONDANSETRON HCL 4 MG/2ML IJ SOLN: 4.0000 mg | Freq: Once | INTRAMUSCULAR | Status: DC | PRN

## 2021-05-08 MED ORDER — PAROXETINE HCL 20 MG PO TABS
20.0000 mg | ORAL_TABLET | Freq: Every day | ORAL | Status: DC
  Administered 2021-05-08: 20 mg via ORAL
  Filled 2021-05-08 (×2): qty 1

## 2021-05-08 MED ORDER — CHLORHEXIDINE GLUCONATE 0.12 % MT SOLN
15.0000 mL | Freq: Once | OROMUCOSAL | Status: AC
  Administered 2021-05-08: 15 mL via OROMUCOSAL
  Filled 2021-05-08: qty 15

## 2021-05-08 MED ORDER — ACETAMINOPHEN 650 MG RE SUPP: 650.0000 mg | RECTAL | Status: DC | PRN

## 2021-05-08 MED ORDER — FENTANYL CITRATE (PF) 100 MCG/2ML IJ SOLN: 25.0000 ug | INTRAMUSCULAR | Status: DC | PRN

## 2021-05-08 MED ORDER — CLEVIDIPINE BUTYRATE 0.5 MG/ML IV EMUL
INTRAVENOUS | Status: AC
  Filled 2021-05-08: qty 50

## 2021-05-08 MED ORDER — CLEVIDIPINE BUTYRATE 0.5 MG/ML IV EMUL
INTRAVENOUS | Status: DC | PRN
  Administered 2021-05-08: 5 mg/h via INTRAVENOUS

## 2021-05-08 MED ORDER — SODIUM CHLORIDE 0.9 % IV SOLN: INTRAVENOUS | Status: DC

## 2021-05-08 MED ORDER — IOHEXOL 300 MG/ML  SOLN
100.0000 mL | Freq: Once | INTRAMUSCULAR | Status: AC | PRN
  Administered 2021-05-08: 77 mL via INTRA_ARTERIAL

## 2021-05-08 MED ORDER — PANTOPRAZOLE SODIUM 20 MG PO TBEC
20.0000 mg | DELAYED_RELEASE_TABLET | Freq: Every day | ORAL | Status: DC
  Administered 2021-05-08: 20 mg via ORAL
  Filled 2021-05-08 (×2): qty 1

## 2021-05-08 MED ORDER — TICAGRELOR 90 MG PO TABS: 90.0000 mg | ORAL_TABLET | ORAL | Status: DC

## 2021-05-08 MED ORDER — VERAPAMIL HCL 2.5 MG/ML IV SOLN
INTRAVENOUS | Status: AC
  Filled 2021-05-08: qty 2

## 2021-05-08 MED ORDER — OXYCODONE HCL 5 MG PO TABS: 5.0000 mg | ORAL_TABLET | Freq: Once | ORAL | Status: DC | PRN

## 2021-05-08 MED ORDER — CEFAZOLIN SODIUM-DEXTROSE 2-4 GM/100ML-% IV SOLN
2.0000 g | INTRAVENOUS | Status: AC
  Administered 2021-05-08: 2 g via INTRAVENOUS
  Filled 2021-05-08: qty 100

## 2021-05-08 MED ORDER — FENTANYL CITRATE (PF) 250 MCG/5ML IJ SOLN: INTRAMUSCULAR | Status: DC | PRN

## 2021-05-08 MED ORDER — ORAL CARE MOUTH RINSE: 15.0000 mL | Freq: Once | OROMUCOSAL | Status: AC

## 2021-05-08 MED ORDER — CHLORHEXIDINE GLUCONATE CLOTH 2 % EX PADS
6.0000 | MEDICATED_PAD | Freq: Every day | CUTANEOUS | Status: DC
  Administered 2021-05-08: 6 via TOPICAL

## 2021-05-08 MED ORDER — EPHEDRINE SULFATE-NACL 50-0.9 MG/10ML-% IV SOSY
PREFILLED_SYRINGE | INTRAVENOUS | Status: DC | PRN
  Administered 2021-05-08: 2.5 mg via INTRAVENOUS

## 2021-05-08 MED ORDER — LIDOCAINE HCL 1 % IJ SOLN
INTRAMUSCULAR | Status: AC
  Filled 2021-05-08: qty 20

## 2021-05-08 MED ORDER — PROPOFOL 10 MG/ML IV BOLUS
INTRAVENOUS | Status: DC | PRN
  Administered 2021-05-08: 20 mg via INTRAVENOUS
  Administered 2021-05-08: 120 mg via INTRAVENOUS
  Administered 2021-05-08: 20 mg via INTRAVENOUS

## 2021-05-08 MED ORDER — PHENYLEPHRINE HCL-NACL 20-0.9 MG/250ML-% IV SOLN
INTRAVENOUS | Status: DC | PRN
  Administered 2021-05-08: 40 ug/min via INTRAVENOUS

## 2021-05-08 MED ORDER — DEXAMETHASONE SODIUM PHOSPHATE 10 MG/ML IJ SOLN
INTRAMUSCULAR | Status: DC | PRN
  Administered 2021-05-08: 10 mg via INTRAVENOUS

## 2021-05-08 MED ORDER — SUGAMMADEX SODIUM 200 MG/2ML IV SOLN
INTRAVENOUS | Status: DC | PRN
Start: 2021-05-08 — End: 2021-05-08
  Administered 2021-05-08: 400 mg via INTRAVENOUS

## 2021-05-08 MED ORDER — ONDANSETRON HCL 4 MG/2ML IJ SOLN
INTRAMUSCULAR | Status: DC | PRN
Start: 2021-05-08 — End: 2021-05-08
  Administered 2021-05-08: 4 mg via INTRAVENOUS

## 2021-05-08 MED ORDER — OXYCODONE HCL 5 MG/5ML PO SOLN: 5.0000 mg | Freq: Once | ORAL | Status: DC | PRN

## 2021-05-08 MED ORDER — FENTANYL CITRATE (PF) 100 MCG/2ML IJ SOLN
INTRAMUSCULAR | Status: DC | PRN
  Administered 2021-05-08: 100 ug via INTRAVENOUS

## 2021-05-08 MED ORDER — NIMODIPINE 30 MG PO CAPS: 0.0000 mg | ORAL_CAPSULE | ORAL | Status: DC

## 2021-05-08 MED ORDER — HEPARIN SODIUM (PORCINE) 1000 UNIT/ML IJ SOLN
INTRAMUSCULAR | Status: DC | PRN
  Administered 2021-05-08: 2000 [IU] via INTRAVENOUS
  Administered 2021-05-08: 1000 [IU] via INTRAVENOUS

## 2021-05-08 MED ORDER — ACETAMINOPHEN 160 MG/5ML PO SOLN: 650.0000 mg | ORAL | Status: DC | PRN

## 2021-05-08 MED ORDER — HEPARIN SODIUM (PORCINE) 1000 UNIT/ML IJ SOLN
INTRAMUSCULAR | Status: DC
Start: 2021-05-08 — End: 2021-05-08
  Filled 2021-05-08: qty 10

## 2021-05-08 MED ORDER — NITROGLYCERIN 1 MG/10 ML FOR IR/CATH LAB: INTRA_ARTERIAL | Status: AC | PRN

## 2021-05-08 NOTE — Anesthesia Procedure Notes (Signed)
Arterial Line Insertion ?Start/End5/08/2021 8:10 AM, 05/08/2021 8:30 AM ?Performed by: Maxine Glenn, CRNA, CRNA ? Patient location: Pre-op. ?Preanesthetic checklist: patient identified, IV checked, site marked, risks and benefits discussed, surgical consent, monitors and equipment checked, pre-op evaluation, timeout performed and anesthesia consent ?Lidocaine 1% used for infiltration ?Left, radial was placed ?Catheter size: 20 G ?Hand hygiene performed  and maximum sterile barriers used  ? ?Attempts: 1 ?Procedure performed without using ultrasound guided technique. ?Following insertion, dressing applied and Biopatch. ?Post procedure assessment: normal and unchanged ? ? ? ?

## 2021-05-08 NOTE — Transfer of Care (Signed)
Immediate Anesthesia Transfer of Care Note ? ?Patient: Timothy Reeves. ? ?Procedure(s) Performed: EMBOLIZATION ? ?Patient Location: PACU ? ?Anesthesia Type:General ? ?Level of Consciousness: awake and alert  ? ?Airway & Oxygen Therapy: Patient Spontanous Breathing ? ?Post-op Assessment: Report given to RN and Post -op Vital signs reviewed and stable ? ?Post vital signs: Reviewed and stable ? ?Last Vitals:  ?Vitals Value Taken Time  ?BP 117/57 05/08/21 1052  ?Temp    ?Pulse 61 05/08/21 1058  ?Resp 19 05/08/21 1058  ?SpO2 91 % 05/08/21 1058  ?Vitals shown include unvalidated device data. ? ?Last Pain:  ?Vitals:  ? 05/08/21 0738  ?TempSrc:   ?PainSc: 0-No pain  ?   ? ?  ? ?Complications: No notable events documented. ?

## 2021-05-08 NOTE — Sedation Documentation (Signed)
Pt transported to PACU bay 1 with CRNA and this RN. Bedside hand off of care/report given to Sellers, Therapist, sports. ?Right radial site assessed at hand off. Lancie showed where pulse is doplerable.  ?All questions answered prior to this RN's departure.  ?

## 2021-05-08 NOTE — Progress Notes (Signed)
?  Transition of Care (TOC) Screening Note ? ? ?Patient Details  ?Name: Timothy Reeves. ?Date of Birth: Nov 18, 1955 ? ? ?Transition of Care (TOC) CM/SW Contact:    ?Mearl Latin, LCSW ?Phone Number: ?05/08/2021, 4:41 PM ? ? ? ?Transition of Care Department [facility]) has reviewed patient and no TOC needs have been identified at this time. We will continue to monitor patient advancement through interdisciplinary progression rounds. If new patient transition needs arise, please place a TOC consult. ? ? ?

## 2021-05-08 NOTE — H&P (Signed)
? ?Chief Complaint: ?Patient was seen in consultation today for ACA aneurysm ? ?Referring Physician(s): Dr. Tommie Sams ? ?Supervising Physician: Baldemar Lenis ? ?Patient Status: [facility] - Out-pt ? ?History of Present Illness: ?Timothy Reeves. is a 66 y.o. male with a medical history significant for CAD, Lupus, anxiety, psoriasis, HTN and prior treatment of  left ICA and left MCA aneurysms 09/22/20 with Dr. Tommie Sams. He was recently seen 04/17/21 for a diagnostic cerebral angiogram to evaluate treatment outcome and was discovered to have a right A2/ACA saccular aneurysm measuring approximately 3.2 mm. During the course of the study he was also found to have an asymptomatic right radial arteriovenous fistula, likely related to prior vascular access.  ? ?The findings of this study were discussed with the patient and his wife post-procedure and the patient requested to have the right ACA aneurysm treated. He was notified this procedure would be done under general anesthesia with overnight observation in the neuro ICU. He continues to take aspirin and Brilinta daily.  ? ?Past Medical History:  ?Diagnosis Date  ? Anxiety   ? Arthritis   ? Carotid artery disease (HCC)   ? COPD (chronic obstructive pulmonary disease) (HCC)   ? Coronary artery disease   ? COVID   ? either in 2021 or 2022  ? ED (erectile dysfunction)   ? GERD (gastroesophageal reflux disease)   ? Headache   ? hx 20 yrs ago per pt  ? HLD (hyperlipidemia)   ? Hypertension   ? Left bundle branch block (LBBB)   ? Lupus (HCC)   ? Pneumonia   ? Pre-diabetes   ? Psoriasis   ? ? ?Past Surgical History:  ?Procedure Laterality Date  ? CARDIAC CATHETERIZATION    ? IR 3D INDEPENDENT WKST  09/22/2020  ? IR 3D INDEPENDENT WKST  10/04/2020  ? IR ANGIO INTRA EXTRACRAN SEL COM CAROTID INNOMINATE UNI L MOD SED  04/17/2021  ? IR ANGIO INTRA EXTRACRAN SEL INTERNAL CAROTID BILAT MOD SED  09/22/2020  ? IR ANGIO INTRA EXTRACRAN SEL INTERNAL CAROTID  UNI L MOD SED  10/03/2020  ? IR ANGIO INTRA EXTRACRAN SEL INTERNAL CAROTID UNI R MOD SED  04/17/2021  ? IR ANGIO VERTEBRAL SEL SUBCLAVIAN INNOMINATE UNI L MOD SED  09/22/2020  ? IR ANGIO VERTEBRAL SEL VERTEBRAL UNI L MOD SED  04/17/2021  ? IR ANGIO VERTEBRAL SEL VERTEBRAL UNI R MOD SED  09/22/2020  ? IR ANGIOGRAM FOLLOW UP STUDY  10/04/2020  ? IR ANGIOGRAM FOLLOW UP STUDY  10/03/2020  ? IR CT HEAD LTD  10/03/2020  ? IR NEURO EACH ADD'L AFTER BASIC UNI LEFT (MS)  10/05/2020  ? IR RADIOLOGIST EVAL & MGMT  05/05/2021  ? IR TRANSCATH/EMBOLIZ  10/03/2020  ? IR US GUIDE VASC ACCESS RIGHT  09/22/2020  ? IR US GUIDE VASC ACCESS RIGHT  10/03/2020  ? IR US GUIDE VASC ACCESS RIGHT  04/17/2021  ? RADIOLOGY WITH ANESTHESIA N/A 10/03/2020  ? Procedure: EMBOLIZATION WITH ANESTHESIA;  Surgeon: Baldemar Lenis, MD;  Location: [facility] OR;  Service: Radiology;  Laterality: N/A;  ? ? ?Allergies: ?Lipitor [atorvastatin] ? ?Medications: ?Prior to Admission medications   ?Medication Sig Start Date End Date Taking? Authorizing Provider  ?aspirin EC 81 MG tablet Take 81 mg by mouth daily. Swallow whole.   Yes [provider]  ?azelastine (ASTELIN) 0.1 % nasal spray Place 1-2 sprays into both nostrils daily as needed for allergies. 02/23/21  Yes [provider]  ?Cholecalciferol (VITAMIN D3 PO) Take 1,200 mg by mouth daily.   Yes [provider]  ?Cyanocobalamin (B-12) 1000 MCG TABS Take 1,000 mcg by mouth at bedtime. 07/02/20  Yes [provider]  ?Fluticasone-Umeclidin-Vilant (TRELEGY ELLIPTA) 100-62.5-25 MCG/INH AEPB Inhale 1 puff into the lungs daily.   Yes [provider]  ?losartan (COZAAR) 100 MG tablet Take 100 mg by mouth at bedtime. 07/18/20  Yes [provider]  ?pantoprazole (PROTONIX) 20 MG tablet Take 20 mg by mouth daily with supper. 03/29/21  Yes [provider]  ?PARoxetine (PAXIL) 20 MG tablet Take 20 mg by mouth at bedtime.   Yes [provider]  ?rosuvastatin  (CRESTOR) 20 MG tablet Take 20 mg by mouth daily with supper. 08/10/20  Yes [provider]  ?ticagrelor (BRILINTA) 90 MG TABS tablet Take 1 tablet (90 mg total) by mouth 2 (two) times daily. 01/27/21  Yes Lynnette Caffey A, PA-C  ?calcipotriene (DOVONOX) 0.005 % cream Apply to aa's psoriasis QOD alternating with Triamcinolone. ?Patient not taking: Reported on 05/03/2021 11/12/19   Deirdre Evener, MD  ?triamcinolone (NASACORT) 55 MCG/ACT AERO nasal inhaler Place 2 sprays into the nose daily as needed (allergies). 02/23/21   [provider]  ?  ? ?History reviewed. No pertinent family history. ? ?Social History  ? ?Socioeconomic History  ? Marital status: Married  ?  Spouse name: Not on file  ? Number of children: Not on file  ? Years of education: Not on file  ? Highest education level: Not on file  ?Occupational History  ? Not on file  ?Tobacco Use  ? Smoking status: Former  ?  Packs/day: 1.00  ?  Years: 37.00  ?  Pack years: 37.00  ?  Types: Cigarettes  ?  Quit date: 10/04/2008  ?  Years since quitting: 12.6  ? Smokeless tobacco: Never  ?Vaping Use  ? Vaping Use: Never used  ?Substance and Sexual Activity  ? Alcohol use: Yes  ?  Alcohol/week: 2.0 standard drinks  ?  Types: 2 Cans of beer per week  ? Drug use: Not Currently  ? Sexual activity: Not Currently  ?Other Topics Concern  ? Not on file  ?Social History Narrative  ? Not on file  ? ?Social Determinants of Health  ? ?Financial Resource Strain: Not on file  ?Food Insecurity: Not on file  ?Transportation Needs: Not on file  ?Physical Activity: Not on file  ?Stress: Not on file  ?Social Connections: Not on file  ? ? ?Review of Systems: A 12 point ROS discussed and pertinent positives are indicated in the HPI above.  All other systems are negative. ? ?Review of Systems  ?Constitutional:  Negative for appetite change and fatigue.  ?Respiratory:  Negative for cough and shortness of breath.   ?Cardiovascular:  Negative for chest pain and leg  swelling.  ?Gastrointestinal:  Negative for abdominal pain, diarrhea, nausea and vomiting.  ?Neurological:  Negative for dizziness and headaches.  ? ?Vital Signs: ?BP (!) 141/73   Pulse 65   Temp 98.6 ?F (37 ?C) (Oral)   Resp 16   Ht 6\' 1"  (1.854 m)   Wt 192 lb (87.1 kg)   SpO2 96%   BMI 25.33 kg/m?  ? ?Physical Exam ?Constitutional:   ?   General: He is not in acute distress. ?   Appearance: He is not ill-appearing.  ?HENT:  ?   Mouth/Throat:  ?   Mouth: Mucous membranes are moist.  ?  Pharynx: Oropharynx is clear.  ?Cardiovascular:  ?   Rate and Rhythm: Normal rate and regular rhythm.  ?   Pulses: Normal pulses.  ?   Heart sounds: Normal heart sounds.  ?   Comments: Right radial AV fistula ?Pulmonary:  ?   Effort: Pulmonary effort is normal.  ?   Breath sounds: Normal breath sounds.  ?Abdominal:  ?   General: Bowel sounds are normal.  ?   Palpations: Abdomen is soft.  ?Musculoskeletal:  ?   Right lower leg: No edema.  ?   Left lower leg: No edema.  ?Skin: ?   General: Skin is warm and dry.  ?   Comments: Scattered psoriasis   ?Neurological:  ?   Mental Status: He is alert and oriented to person, place, and time.  ? ? ?Imaging: ?CT CHEST WO CONTRAST ? ?Result Date: 04/14/2021 ?CLINICAL DATA:  Pulmonary nodule, former smoker, follow-up EXAM: CT CHEST WITHOUT CONTRAST TECHNIQUE: Multidetector CT imaging of the chest was performed following the standard protocol without IV contrast. RADIATION DOSE REDUCTION: This exam was performed according to the departmental dose-optimization program which includes automated exposure control, adjustment of the mA and/or kV according to patient size and/or use of iterative reconstruction technique. COMPARISON:  07/07/2020 FINDINGS: Cardiovascular: Atherosclerotic calcifications aorta, proximal great vessels, and coronary arteries. Aorta normal caliber. Heart size normal. No pericardial effusion. Mediastinum/Nodes: Esophagus unremarkable. Base of cervical region normal  appearance. Scattered normal size mediastinal lymph nodes. No thoracic adenopathy. Lungs/Pleura: Centrilobular emphysematous changes. Noncalcified 4 mm RIGHT upper lobe nodule anteriorly image 62 unchanged. Diffuse peribro

## 2021-05-08 NOTE — Procedures (Signed)
INTERVENTIONAL NEURORADIOLOGY BRIEF POSTPROCEDURE NOTE ? ?DIAGNOSTIC CEREBRAL ANGIOGRAM AND ENDOVASCULAR ANEURYSM EMBOLIZATION  ? ?Attending: Dr. Baldemar Lenis ? ?Assistant: Dr. Marjo Bicker ? ?Diagnosis: Right A2/ACA aneurysm  ? ?Access site: Right radial artery  ? ?Access closure: Inflatable band  ? ?Anesthesia: GETA  ? ?Medication used: Refer to anesthesia documentation for sedation medication. ? ?Complications: None.  ? ?Estimated blood loss: None.  ? ?Specimen: None.  ? ?Findings: Irregularly shaped proximal right A2/ACA saccular aneurysm measuring approximately 3.6 mm.  Endovascular embolization performed with coils with complete aneurysm occlusion.  No evidence of thromboembolic or hemorrhagic complication.  ? ?The patient tolerated the procedure well without incident or complication and is in stable condition. ? ? ?PLAN: ?- observation in ICU for 24 hours  ?- SBP 100-140 mmHg  ? ? ?  ?

## 2021-05-08 NOTE — Anesthesia Procedure Notes (Signed)
Procedure Name: Intubation ?Date/Time: 05/08/2021 8:56 AM ?Performed by: Dorann Lodge, CRNA ?Pre-anesthesia Checklist: Patient identified, Emergency Drugs available, Suction available and Patient being monitored ?Patient Re-evaluated:Patient Re-evaluated prior to induction ?Oxygen Delivery Method: Circle System Utilized ?Preoxygenation: Pre-oxygenation with 100% oxygen ?Induction Type: IV induction ?Ventilation: Mask ventilation without difficulty ?Laryngoscope Size: Mac and 4 ?Grade View: Grade II ?Tube type: Oral ?Tube size: 7.5 mm ?Number of attempts: 1 ?Airway Equipment and Method: Stylet ?Placement Confirmation: ETT inserted through vocal cords under direct vision, positive ETCO2 and breath sounds checked- equal and bilateral ?Secured at: 22 cm ?Tube secured with: Tape ?Dental Injury: Teeth and Oropharynx as per pre-operative assessment  ? ? ? ? ?

## 2021-05-08 NOTE — Progress Notes (Addendum)
Patient is s/p endovascular embolization with coils for the right ACA aneurysm via right RA approach by Dr. Karenann Cai this morning.  ? ?The procedure occur w/o difficulty, patient was transferred to the floor in stable condition.  ? ?Patient seen at bedside with Dr. Karenann Cai.  ? ?Sitting in bed, NAD, eating lunch. Wife at bedside.  ?Reports that he is doing great, no complaints at the moment.  ?Denies headache.  ? ?Radial band on right RA, old, dried blood under the radial band, no active bleeding noted.  ?Right RA 2+  ? ?Patient to stay in NICU for observation, plan d/c tomorrow if stable.  ?Please call NIR for questions and concerns.  ? ? ?Berit Raczkowski H Jennesis Ramaswamy PA-C ?05/08/2021 3:10 PM ? ? ? ? ?

## 2021-05-08 NOTE — Anesthesia Postprocedure Evaluation (Signed)
Anesthesia Post Note ? ?Patient: Zared Knoth. ? ?Procedure(s) Performed: EMBOLIZATION ? ?  ? ?Patient location during evaluation: PACU ?Anesthesia Type: General ?Level of consciousness: awake and alert and oriented ?Pain management: pain level controlled ?Vital Signs Assessment: post-procedure vital signs reviewed and stable ?Respiratory status: spontaneous breathing, nonlabored ventilation and respiratory function stable ?Cardiovascular status: blood pressure returned to baseline and stable ?Postop Assessment: no apparent nausea or vomiting ?Anesthetic complications: no ? ? ?No notable events documented. ? ?Last Vitals:  ?Vitals:  ? 05/08/21 1206 05/08/21 1300  ?BP: 128/79 91/64  ?Pulse: 60 (!) 58  ?Resp: 18 14  ?Temp: 36.6 ?C   ?SpO2: 95% 97%  ?  ?Last Pain:  ?Vitals:  ? 05/08/21 1206  ?TempSrc: Oral  ?PainSc:   ? ? ?  ?  ?  ?  ?  ?  ? ?Adriella Essex A. ? ? ? ? ?

## 2021-05-09 ENCOUNTER — Encounter (HOSPITAL_COMMUNITY): Payer: Self-pay | Admitting: Neuroradiology

## 2021-05-09 NOTE — Discharge Summary (Signed)
? ?Patient ID: ?Timothy Reeves. ?MRN: [id number] ?DOB/AGE: [birth year] 66 y.o. ? ?Admit date: 05/08/2021 ?Discharge date: 05/09/2021 ? ?Supervising Physician: Baldemar Lenis ? ?Patient Status: [facility] - In-pt ? ?Admission Diagnoses: Right anterior communicating artery  ? ?Discharge Diagnoses:  ?Principal Problem: ?  Intracranial aneurysm ?Active Problems: ?  Brain aneurysm ? ? ?Discharged Condition: good ? ?Hospital Course: Right Anterior communicating artery aneurysm embolization performed in IR with Dr Quay Burow yesterday. No complication  ?Overnight observation without issues. ?Pt has done well ?Eating well; slept well ?Denies N/V ?Mild headache always ?Denies speech/vision changes ?Denies numbness or tingling ?Urinating well-- yellow color ?Passing gas ?Has ambulated in halls with assistance-- denies dizziness or weakness. ?Dr Quay Burow has seen and evaluated pt. ?Approves discharge  ? ? ?Consults: None ? ?Significant Diagnostic Studies: DIAGNOSTIC CEREBRAL ANGIOGRAM AND ENDOVASCULAR ANEURYSM EMBOLIZATION  ? ?Treatments: Findings: Irregularly shaped proximal right A2/ACA saccular aneurysm measuring approximately 3.6 mm.  Endovascular embolization performed with coils with complete aneurysm occlusion.  No evidence of thromboembolic or hemorrhagic complication.  ?  ?The patient tolerated the procedure well without incident or complication and is in stable condition. ? ?Discharge Exam: ?Blood pressure (!) 127/54, pulse 62, temperature 98.5 ?F (36.9 ?C), temperature source Oral, resp. rate 18, height 6\' 1"  (1.854 m), weight 192 lb (87.1 kg), SpO2 93 %. ? ?A/O ?Appropriate ?Pleasant ?Smile = ?Mild bruising on tongue noted ?Tongue midline ?Face symmetrical ?Puffs cheeks = ?Good strength B ?Good sensation B ?EOM ?Lungs CTA ?Heart RRR ?Abd soft; NT no masses ?Extr FROM ?Rt wrist site NT no bleeding no hematoma ? ?Results for orders placed or performed during the hospital encounter of 05/08/21  ?MRSA Next Gen  by PCR, Nasal  ? Specimen: Nasal Mucosa; Nasal Swab  ?Result Value Ref Range  ? MRSA by PCR Next Gen NOT DETECTED NOT DETECTED  ?Protime-INR  ?Result Value Ref Range  ? Prothrombin Time 13.1 11.4 - 15.2 seconds  ? INR 1.0 0.8 - 1.2  ?CBC with Differential/Platelet  ?Result Value Ref Range  ? WBC 5.9 4.0 - 10.5 K/uL  ? RBC 4.22 4.22 - 5.81 MIL/uL  ? Hemoglobin 12.5 (L) 13.0 - 17.0 g/dL  ? HCT 37.5 (L) 39.0 - 52.0 %  ? MCV 88.9 80.0 - 100.0 fL  ? MCH 29.6 26.0 - 34.0 pg  ? MCHC 33.3 30.0 - 36.0 g/dL  ? RDW 12.9 11.5 - 15.5 %  ? Platelets 244 150 - 400 K/uL  ? nRBC 0.0 0.0 - 0.2 %  ? Neutrophils Relative % 73 %  ? Neutro Abs 4.3 1.7 - 7.7 K/uL  ? Lymphocytes Relative 14 %  ? Lymphs Abs 0.8 0.7 - 4.0 K/uL  ? Monocytes Relative 9 %  ? Monocytes Absolute 0.5 0.1 - 1.0 K/uL  ? Eosinophils Relative 3 %  ? Eosinophils Absolute 0.2 0.0 - 0.5 K/uL  ? Basophils Relative 1 %  ? Basophils Absolute 0.0 0.0 - 0.1 K/uL  ? Immature Granulocytes 0 %  ? Abs Immature Granulocytes 0.02 0.00 - 0.07 K/uL  ?Basic metabolic panel  ?Result Value Ref Range  ? Sodium 139 135 - 145 mmol/L  ? Potassium 4.2 3.5 - 5.1 mmol/L  ? Chloride 107 98 - 111 mmol/L  ? CO2 20 (L) 22 - 32 mmol/L  ? Glucose, Bld 104 (H) 70 - 99 mg/dL  ? BUN 23 8 - 23 mg/dL  ? Creatinine, Ser 1.20 0.61 - 1.24 mg/dL  ? Calcium 8.6 (L) 8.9 -  10.3 mg/dL  ? GFR, Estimated >60 >60 mL/min  ? Anion gap 12 5 - 15  ?  ? ? ?Disposition:  ? ?Doing well this am ?Dr Debbrah Alar has seen and evaluated pt ?Approved for DC today ?Pt is to continue all home meds ?Continue ASA 81 and Brilinta 90 mg once daily x 1 week ?Then DC Brilinta and Continue ASA 81 mg daily ?Follow up appt with Dr Debbrah Alar 6 months ?He will hear from scheduler for time and date ? ? ? ?Discharge Instructions   ? ? Call MD for:   Complete by: As directed ?  ? Call MD for:  difficulty breathing, headache or visual disturbances   Complete by: As directed ?  ? Call MD for:  extreme fatigue   Complete by: As directed ?  ? Call  MD for:  hives   Complete by: As directed ?  ? Call MD for:  persistant dizziness or light-headedness   Complete by: As directed ?  ? Call MD for:  persistant nausea and vomiting   Complete by: As directed ?  ? Call MD for:  redness, tenderness, or signs of infection (pain, swelling, redness, odor or green/yellow discharge around incision site)   Complete by: As directed ?  ? Call MD for:  severe uncontrolled pain   Complete by: As directed ?  ? Call MD for:  temperature >100.4   Complete by: As directed ?  ? Diet - low sodium heart healthy   Complete by: As directed ?  ? Discharge instructions   Complete by: As directed ?  ? Resume all meds-- take ASA 81 And Brilinta 90 mg once daily x 1 week; then continue ASA 81 mg daily; follow up with Dr Debbrah Alar 6 months (scheduler will call pt)  ? Discharge wound care:   Complete by: As directed ?  ? May keep bandaid on Rt wrist site daily x 1 week--- may shower today--- keep wrist site dray as much as possible x 1 week  ? Driving Restrictions   Complete by: As directed ?  ? No driving 3-4 days  ? Increase activity slowly   Complete by: As directed ?  ? Lifting restrictions   Complete by: As directed ?  ? No lifting over 20 lbs x 1 week--- may lift heavier items gradually at 10 days after discharge  ? ?  ? ?Allergies as of 05/09/2021   ? ?   Reactions  ? Lipitor [atorvastatin] Other (See Comments)  ? Muscle aches  ? ?  ? ?  ?Medication List  ?  ? ?TAKE these medications   ? ?aspirin EC 81 MG tablet ?Take 81 mg by mouth daily. Swallow whole. ?  ?azelastine 0.1 % nasal spray ?Commonly known as: ASTELIN ?Place 1-2 sprays into both nostrils daily as needed for allergies. ?  ?B-12 1000 MCG Tabs ?Take 1,000 mcg by mouth at bedtime. ?  ?calcipotriene 0.005 % cream ?Commonly known as: DOVONOX ?Apply to aa's psoriasis QOD alternating with Triamcinolone. ?  ?losartan 100 MG tablet ?Commonly known as: COZAAR ?Take 100 mg by mouth at bedtime. ?  ?pantoprazole 20 MG tablet ?Commonly known  as: PROTONIX ?Take 20 mg by mouth daily with supper. ?  ?PARoxetine 20 MG tablet ?Commonly known as: PAXIL ?Take 20 mg by mouth at bedtime. ?  ?rosuvastatin 20 MG tablet ?Commonly known as: CRESTOR ?Take 20 mg by mouth daily with supper. ?  ?ticagrelor 90 MG Tabs tablet ?Commonly known as: Brilinta ?  Take 1 tablet (90 mg total) by mouth 2 (two) times daily. ?  ?Trelegy Ellipta 100-62.5-25 MCG/ACT Aepb ?Generic drug: Fluticasone-Umeclidin-Vilant ?Inhale 1 puff into the lungs daily. ?  ?triamcinolone 55 MCG/ACT Aero nasal inhaler ?Commonly known as: NASACORT ?Place 2 sprays into the nose daily as needed (allergies). ?  ?VITAMIN D3 PO ?Take 1,200 mg by mouth daily. ?  ? ?  ? ?  ?  ? ? ?  ?Discharge Care Instructions  ?(From admission, onward)  ?  ? ? ?  ? ?  Start     Ordered  ? 05/09/21 0000  Discharge wound care:       ?Comments: May keep bandaid on Rt wrist site daily x 1 week--- may shower today--- keep wrist site dray as much as possible x 1 week  ? 05/09/21 0938  ? ?  ?  ? ?  ? ? Follow-up Information   ? ? de Sindy Messing, Erven Colla, MD Follow up in 6 month(s).   ?Specialties: Radiology, Interventional Radiology ?Why: follow up appt 6 months with Dr Debbrah Alar; pt will hear from scheduler for time and date- call [phone] if any needs ?Contact information: ?[address] ?[address] ?[place] [address] ?[phone] ? ? ?  ?  ? ?  ?  ? ?  ?  ? ?Electronically Signed: ?Lavonia Drafts, PA-C ?05/09/2021, 9:41 AM ? ? ?I have spent Greater Than 30 Minutes discharging [place].. ? ? ?  ?

## 2021-05-11 ENCOUNTER — Other Ambulatory Visit (HOSPITAL_COMMUNITY): Payer: Self-pay | Admitting: Neuroradiology

## 2021-05-11 ENCOUNTER — Encounter (HOSPITAL_COMMUNITY): Payer: Self-pay

## 2021-05-11 DIAGNOSIS — I671 Cerebral aneurysm, nonruptured: Secondary | ICD-10-CM

## 2021-05-11 HISTORY — PX: IR NEURO EACH ADD'L AFTER BASIC UNI RIGHT (MS): IMG5374

## 2021-05-11 HISTORY — PX: IR ANGIO INTRA EXTRACRAN SEL INTERNAL CAROTID UNI R MOD SED: IMG5362

## 2021-08-24 ENCOUNTER — Other Ambulatory Visit (INDEPENDENT_AMBULATORY_CARE_PROVIDER_SITE_OTHER): Payer: Self-pay | Admitting: Vascular Surgery

## 2021-08-24 DIAGNOSIS — I770 Arteriovenous fistula, acquired: Secondary | ICD-10-CM

## 2021-08-24 DIAGNOSIS — I6523 Occlusion and stenosis of bilateral carotid arteries: Secondary | ICD-10-CM

## 2021-08-28 ENCOUNTER — Ambulatory Visit (INDEPENDENT_AMBULATORY_CARE_PROVIDER_SITE_OTHER): Payer: Medicare PPO

## 2021-08-28 ENCOUNTER — Ambulatory Visit (INDEPENDENT_AMBULATORY_CARE_PROVIDER_SITE_OTHER): Payer: Medicare PPO | Admitting: Vascular Surgery

## 2021-08-28 ENCOUNTER — Encounter (INDEPENDENT_AMBULATORY_CARE_PROVIDER_SITE_OTHER): Payer: Self-pay | Admitting: Vascular Surgery

## 2021-08-28 VITALS — BP 178/87 | HR 55 | Resp 18 | Ht 73.0 in | Wt 194.4 lb

## 2021-08-28 DIAGNOSIS — I770 Arteriovenous fistula, acquired: Secondary | ICD-10-CM

## 2021-08-28 DIAGNOSIS — I10 Essential (primary) hypertension: Secondary | ICD-10-CM | POA: Diagnosis not present

## 2021-08-28 DIAGNOSIS — I6523 Occlusion and stenosis of bilateral carotid arteries: Secondary | ICD-10-CM

## 2021-08-28 DIAGNOSIS — J449 Chronic obstructive pulmonary disease, unspecified: Secondary | ICD-10-CM

## 2021-08-28 DIAGNOSIS — K219 Gastro-esophageal reflux disease without esophagitis: Secondary | ICD-10-CM

## 2021-08-28 DIAGNOSIS — E782 Mixed hyperlipidemia: Secondary | ICD-10-CM

## 2021-08-28 NOTE — Progress Notes (Signed)
MRN : [id number]  Timothy Reeves. is a 66 y.o. ([birth year]) male who presents with chief complaint of check circulation.  History of Present Illness:   The patient is seen for follow up evaluation of carotid stenosis. The carotid stenosis followed by ultrasound.    The patient denies amaurosis fugax. There is no recent history of TIA symptoms or focal motor deficits. There is no prior documented CVA.   The patient is taking enteric-coated aspirin 81 mg daily.   There is no history of migraine headaches. There is no history of seizures.   The patient has a history of coronary artery disease, no recent episodes of angina or shortness of breath. The patient denies PAD or claudication symptoms. There is a history of hyperlipidemia which is being treated with a statin.     No family history of AAA.    Patient denies amaurosis fugax or TIA symptoms. There is no history of claudication or rest pain symptoms of the lower extremities.  The patient denies angina or shortness of breath.   CT scan of the chest 07/07/2020 is negative for aneurysmal disease.      Previous duplex ultrasound of the abdominal aorta date 03/07/2020 is negative for AAA   Carotid Duplex done today shows <39% bilaterally, no change compared to previous study 01/16/2021.  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  Current Meds  Medication Sig   albuterol (VENTOLIN HFA) 108 (90 Base) MCG/ACT inhaler Inhale 2 puffs into the lungs every 6 (six) hours as needed.   aspirin EC 81 MG tablet Take 81 mg by mouth daily. Swallow whole.   azelastine (ASTELIN) 0.1 % nasal spray Place 1-2 sprays into both nostrils daily as needed for allergies.   Cholecalciferol (VITAMIN D3 PO) Take 1,200 mg by mouth daily.   Cyanocobalamin (B-12) 1000 MCG TABS Take 1,000 mcg by mouth at bedtime.   ipratropium (ATROVENT)  0.06 % nasal spray Place into the nose.   losartan (COZAAR) 100 MG tablet Take 100 mg by mouth at bedtime.   pantoprazole (PROTONIX) 20 MG tablet Take 20 mg by mouth daily with supper.   PARoxetine (PAXIL) 20 MG tablet Take 20 mg by mouth at bedtime.   rosuvastatin (CRESTOR) 20 MG tablet Take 20 mg by mouth daily with supper.   triamcinolone (NASACORT) 55 MCG/ACT AERO nasal inhaler Place 2 sprays into the nose daily as needed (allergies).    Past Medical History:  Diagnosis Date   Anxiety    Arthritis    Carotid artery disease (HCC)    COPD (chronic obstructive pulmonary disease) (HCC)    Coronary artery disease    COVID    either in 2021 or 2022   ED (erectile dysfunction)    GERD (gastroesophageal reflux disease)    Headache    hx 20 yrs ago per pt   HLD (hyperlipidemia)    Hypertension    Left bundle branch block (LBBB)    Lupus (HCC)    Pneumonia    Pre-diabetes    Psoriasis     Past Surgical History:  Procedure Laterality Date   CARDIAC CATHETERIZATION     IR 3D INDEPENDENT WKST  09/22/2020   IR 3D INDEPENDENT WKST  10/04/2020   IR 3D INDEPENDENT WKST  05/08/2021   IR ANGIO INTRA EXTRACRAN SEL COM CAROTID  INNOMINATE UNI L MOD SED  04/17/2021   IR ANGIO INTRA EXTRACRAN SEL INTERNAL CAROTID BILAT MOD SED  09/22/2020   IR ANGIO INTRA EXTRACRAN SEL INTERNAL CAROTID UNI L MOD SED  10/03/2020   IR ANGIO INTRA EXTRACRAN SEL INTERNAL CAROTID UNI R MOD SED  04/17/2021   IR ANGIO INTRA EXTRACRAN SEL INTERNAL CAROTID UNI R MOD SED  05/11/2021   IR ANGIO VERTEBRAL SEL SUBCLAVIAN INNOMINATE UNI L MOD SED  09/22/2020   IR ANGIO VERTEBRAL SEL VERTEBRAL UNI L MOD SED  04/17/2021   IR ANGIO VERTEBRAL SEL VERTEBRAL UNI R MOD SED  09/22/2020   IR ANGIOGRAM FOLLOW UP STUDY  10/04/2020   IR ANGIOGRAM FOLLOW UP STUDY  10/03/2020   IR ANGIOGRAM FOLLOW UP STUDY  05/08/2021   IR ANGIOGRAM FOLLOW UP STUDY  05/08/2021   IR CT HEAD LTD  10/03/2020   IR CT HEAD LTD  05/08/2021   IR NEURO EACH ADD'L AFTER BASIC  UNI LEFT (MS)  10/05/2020   IR NEURO EACH ADD'L AFTER BASIC UNI RIGHT (MS)  05/11/2021   IR RADIOLOGIST EVAL & MGMT  05/05/2021   IR TRANSCATH/EMBOLIZ  10/03/2020   IR TRANSCATH/EMBOLIZ  05/08/2021   IR US GUIDE VASC ACCESS RIGHT  09/22/2020   IR US GUIDE VASC ACCESS RIGHT  10/03/2020   IR US GUIDE VASC ACCESS RIGHT  04/17/2021   IR US GUIDE VASC ACCESS RIGHT  05/08/2021   RADIOLOGY WITH ANESTHESIA N/A 10/03/2020   Procedure: EMBOLIZATION WITH ANESTHESIA;  Surgeon: Baldemar Lenis, MD;  Location: [facility] OR;  Service: Radiology;  Laterality: N/A;   RADIOLOGY WITH ANESTHESIA N/A 05/08/2021   Procedure: EMBOLIZATION;  Surgeon: Baldemar Lenis, MD;  Location: [facility] OR;  Service: Radiology;  Laterality: N/A;    Social History Social History   Tobacco Use   Smoking status: Former    Packs/day: 1.00    Years: 37.00    Total pack years: 37.00    Types: Cigarettes    Quit date: 10/04/2008    Years since quitting: 12.9   Smokeless tobacco: Never  Vaping Use   Vaping Use: Never used  Substance Use Topics   Alcohol use: Yes    Alcohol/week: 2.0 standard drinks of alcohol    Types: 2 Cans of beer per week   Drug use: Not Currently    Family History No family history on file.  Allergies  Allergen Reactions   Lipitor [Atorvastatin] Other (See Comments)    Muscle aches     REVIEW OF SYSTEMS (Negative unless checked)  Constitutional: [] Weight loss  [] Fever  [] Chills Cardiac: [] Chest pain   [] Chest pressure   [] Palpitations   [] Shortness of breath when laying flat   [] Shortness of breath with exertion. Vascular:  [x] Pain in legs with walking   [] Pain in legs at rest  [] History of DVT   [] Phlebitis   [] Swelling in legs   [] Varicose veins   [] Non-healing ulcers Pulmonary:   [] Uses home oxygen   [] Productive cough   [] Hemoptysis   [] Wheeze  [] COPD   [] Asthma Neurologic:  [] Dizziness   [] Seizures   [] History of stroke   [] History of TIA  [] Aphasia   [] Vissual changes   [] Weakness  or numbness in arm   [] Weakness or numbness in leg Musculoskeletal:   [] Joint swelling   [] Joint pain   [] Low back pain Hematologic:  [] Easy bruising  [] Easy bleeding   [] Hypercoagulable state   [] Anemic Gastrointestinal:  [] Diarrhea   [] Vomiting  [  x]Gastroesophageal reflux/heartburn   [] Difficulty swallowing. Genitourinary:  [] Chronic kidney disease   [] Difficult urination  [] Frequent urination   [] Blood in urine Skin:  [] Rashes   [] Ulcers  Psychological:  [] History of anxiety   []  History of major depression.  Physical Examination  Vitals:   08/28/21 0841  BP: (!) 178/87  Pulse: (!) 55  Resp: 18  Weight: 194 lb 6.4 oz (88.2 kg)  Height: 6\' 1"  (1.854 m)   Body mass index is 25.65 kg/m. Gen: WD/WN, NAD Head: [place]/AT, No temporalis wasting.  Ear/Nose/Throat: Hearing grossly intact, nares w/o erythema or drainage Eyes: PER, EOMI, sclera nonicteric.  Neck: Supple, no masses.  No bruit or JVD.  Pulmonary:  Good air movement, no audible wheezing, no use of accessory muscles.  Cardiac: RRR, normal S1, S2, no Murmurs. Vascular:  thrill and bruit noted right wrist Vessel Right Left  Radial Palpable Palpable  Ulnar Palpable Palpable  Carotid Palpable Palpable  Gastrointestinal: soft, non-distended. No guarding/no peritoneal signs.  Musculoskeletal: M/S 5/5 throughout.  No visible deformity.  Neurologic: CN 2-12 intact. Pain and light touch intact in extremities.  Symmetrical.  Speech is fluent. Motor exam as listed above. Psychiatric: Judgment intact, Mood & affect appropriate for pt's clinical situation. Dermatologic: No rashes or ulcers noted.  No changes consistent with cellulitis.   CBC Lab Results  Component Value Date   WBC 5.9 05/08/2021   HGB 12.5 (L) 05/08/2021   HCT 37.5 (L) 05/08/2021   MCV 88.9 05/08/2021   PLT 244 05/08/2021    BMET    Component Value Date/Time   NA 139 05/08/2021 0714   K 4.2 05/08/2021 0714   CL 107 05/08/2021 0714   CO2 20 (L) 05/08/2021  0714   GLUCOSE 104 (H) 05/08/2021 0714   BUN 23 05/08/2021 0714   CREATININE 1.20 05/08/2021 0714   CALCIUM 8.6 (L) 05/08/2021 0714   GFRNONAA >60 05/08/2021 0714   CrCl cannot be calculated (Patient's most recent lab result is older than the maximum 21 days allowed.).  COAG Lab Results  Component Value Date   INR 1.0 05/08/2021   INR 1.0 04/14/2021   INR 0.9 10/03/2020    Radiology No results found.   Assessment/Plan 1. Bilateral carotid artery stenosis Recommend:  Given the patient's asymptomatic subcritical stenosis no further invasive testing or surgery at this time.  Duplex ultrasound shows 1-39% stenosis bilaterally.  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  We discussed that this fistula is not likely to close spontaneously but at this time it is not causing him any issues or problems.  At this point in time the patient is willing to have the fistula followed and should anything change will undergo surgical repair  3. Essential hypertension Continue antihypertensive medications as already ordered, these medications have been reviewed and there are no changes at this time.   4. COPD, mild (HCC) Continue pulmonary medications and aerosols as already ordered, these medications have been reviewed and there are no changes at this time.    5. Gastroesophageal reflux disease without esophagitis Continue PPI as already ordered, this medication has been reviewed and there are no changes at this time.  Avoidence of  caffeine and alcohol  Moderate elevation of the head of the bed    6. Mixed hyperlipidemia Continue statin  as ordered and reviewed, no changes at this time       Levora Dredge, MD  08/28/2021 8:48 AM

## 2021-09-05 ENCOUNTER — Encounter (INDEPENDENT_AMBULATORY_CARE_PROVIDER_SITE_OTHER): Payer: Self-pay | Admitting: Vascular Surgery

## 2021-09-05 DIAGNOSIS — I770 Arteriovenous fistula, acquired: Secondary | ICD-10-CM | POA: Insufficient documentation

## 2021-10-06 ENCOUNTER — Telehealth (HOSPITAL_COMMUNITY): Payer: Self-pay | Admitting: Radiology

## 2021-10-06 NOTE — Telephone Encounter (Signed)
Pt will be due for his 6 month f/u angio with Dr. Debbrah Alar in November. He called to schedule now. I will send auth request today for this f/u. JM

## 2021-10-16 ENCOUNTER — Other Ambulatory Visit: Payer: Self-pay | Admitting: Student

## 2021-10-16 DIAGNOSIS — I6529 Occlusion and stenosis of unspecified carotid artery: Secondary | ICD-10-CM

## 2021-10-16 DIAGNOSIS — I671 Cerebral aneurysm, nonruptured: Secondary | ICD-10-CM

## 2021-10-17 ENCOUNTER — Other Ambulatory Visit (HOSPITAL_COMMUNITY): Payer: Self-pay | Admitting: Neuroradiology

## 2021-10-17 ENCOUNTER — Ambulatory Visit (HOSPITAL_COMMUNITY)
Admission: RE | Admit: 2021-10-17 | Discharge: 2021-10-17 | Disposition: A | Discharge status: Home / Self Care | Payer: Medicare PPO | Source: Ambulatory Visit | Attending: Neuroradiology | Admitting: Neuroradiology

## 2021-10-17 ENCOUNTER — Encounter (HOSPITAL_COMMUNITY): Payer: Self-pay

## 2021-10-17 ENCOUNTER — Other Ambulatory Visit: Payer: Self-pay

## 2021-10-17 DIAGNOSIS — I671 Cerebral aneurysm, nonruptured: Secondary | ICD-10-CM | POA: Insufficient documentation

## 2021-10-17 DIAGNOSIS — I6529 Occlusion and stenosis of unspecified carotid artery: Secondary | ICD-10-CM

## 2021-10-17 HISTORY — PX: IR US GUIDE VASC ACCESS RIGHT: IMG2390

## 2021-10-17 HISTORY — PX: IR ANGIO VERTEBRAL SEL VERTEBRAL UNI R MOD SED: IMG5368

## 2021-10-17 HISTORY — PX: IR ANGIO VERTEBRAL SEL SUBCLAVIAN INNOMINATE UNI L MOD SED: IMG5364

## 2021-10-17 HISTORY — PX: IR ANGIO INTRA EXTRACRAN SEL INTERNAL CAROTID BILAT MOD SED: IMG5363

## 2021-10-17 LAB — CBC WITH DIFFERENTIAL/PLATELET
Abs Immature Granulocytes: 0.04 10*3/uL (ref 0.00–0.07)
Basophils Absolute: 0 10*3/uL (ref 0.0–0.1)
Basophils Relative: 1 %
Eosinophils Absolute: 0.2 10*3/uL (ref 0.0–0.5)
Eosinophils Relative: 2 %
HCT: 44 % (ref 39.0–52.0)
Hemoglobin: 14.4 g/dL (ref 13.0–17.0)
Immature Granulocytes: 1 %
Lymphocytes Relative: 26 %
Lymphs Abs: 2 10*3/uL (ref 0.7–4.0)
MCH: 29.5 pg (ref 26.0–34.0)
MCHC: 32.7 g/dL (ref 30.0–36.0)
MCV: 90.2 fL (ref 80.0–100.0)
Monocytes Absolute: 0.7 10*3/uL (ref 0.1–1.0)
Monocytes Relative: 9 %
Neutro Abs: 4.8 10*3/uL (ref 1.7–7.7)
Neutrophils Relative %: 61 %
Platelets: 248 10*3/uL (ref 150–400)
RBC: 4.88 MIL/uL (ref 4.22–5.81)
RDW: 14 % (ref 11.5–15.5)
WBC: 7.8 10*3/uL (ref 4.0–10.5)
nRBC: 0 % (ref 0.0–0.2)

## 2021-10-17 LAB — BASIC METABOLIC PANEL
Anion gap: 11 (ref 5–15)
BUN: 22 mg/dL (ref 8–23)
CO2: 27 mmol/L (ref 22–32)
Calcium: 8.7 mg/dL — ABNORMAL LOW (ref 8.9–10.3)
Chloride: 104 mmol/L (ref 98–111)
Creatinine, Ser: 1.18 mg/dL (ref 0.61–1.24)
GFR, Estimated: >60 mL/min (ref >60)
Glucose, Bld: 94 mg/dL (ref 70–99)
Potassium: 4.8 mmol/L (ref 3.5–5.1)
Sodium: 142 mmol/L (ref 135–145)

## 2021-10-17 LAB — PROTIME-INR
INR: 1 (ref 0.8–1.2)
Prothrombin Time: 12.8 seconds (ref 11.4–15.2)

## 2021-10-17 MED ORDER — LIDOCAINE HCL 1 % IJ SOLN
INTRAMUSCULAR | Status: AC
  Filled 2021-10-17: qty 20

## 2021-10-17 MED ORDER — NITROGLYCERIN 1 MG/10 ML FOR IR/CATH LAB
INTRA_ARTERIAL | Status: AC
  Filled 2021-10-17: qty 10

## 2021-10-17 MED ORDER — FENTANYL CITRATE (PF) 100 MCG/2ML IJ SOLN
INTRAMUSCULAR | Status: AC | PRN
  Administered 2021-10-17: 25 ug via INTRAVENOUS

## 2021-10-17 MED ORDER — ATROPINE SULFATE 1 MG/ML IV SOLN
INTRAVENOUS | Status: AC
  Filled 2021-10-17: qty 1

## 2021-10-17 MED ORDER — HEPARIN SODIUM (PORCINE) 1000 UNIT/ML IJ SOLN
INTRAMUSCULAR | Status: AC
  Filled 2021-10-17: qty 10

## 2021-10-17 MED ORDER — MIDAZOLAM HCL 2 MG/2ML IJ SOLN
INTRAMUSCULAR | Status: AC
  Filled 2021-10-17: qty 2

## 2021-10-17 MED ORDER — IOHEXOL 300 MG/ML  SOLN
150.0000 mL | Freq: Once | INTRAMUSCULAR | Status: AC | PRN
  Administered 2021-10-17: 85 mL via INTRA_ARTERIAL

## 2021-10-17 MED ORDER — FENTANYL CITRATE (PF) 100 MCG/2ML IJ SOLN
INTRAMUSCULAR | Status: AC
  Filled 2021-10-17: qty 2

## 2021-10-17 MED ORDER — VERAPAMIL HCL 2.5 MG/ML IV SOLN
INTRA_ARTERIAL | Status: AC | PRN
  Administered 2021-10-17: 9 mL via INTRA_ARTERIAL

## 2021-10-17 MED ORDER — SODIUM CHLORIDE 0.9 % IV SOLN
INTRAVENOUS | Status: DC
Start: 2021-10-17 — End: 2021-10-18

## 2021-10-17 MED ORDER — VERAPAMIL HCL 2.5 MG/ML IV SOLN
INTRAVENOUS | Status: AC
  Filled 2021-10-17: qty 2

## 2021-10-17 MED ORDER — MIDAZOLAM HCL 2 MG/2ML IJ SOLN
INTRAMUSCULAR | Status: AC | PRN
  Administered 2021-10-17: 1 mg via INTRAVENOUS

## 2021-10-17 NOTE — H&P (Signed)
Chief Complaint: Patient was seen in consultation today for Cerebral arteriogram at the request of Dr Joana Reamer Rodrigues,[name]   Supervising Physician: Baldemar Lenis  Patient Status: [facility] - Out-pt  History of Present Illness: Timothy Reeves. is a 66 y.o. 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 Denies any neurologic symptoms Denies headaches; numbness/tingling Doing well Scheduled today for follow up arteriogram per Dr Tommi Rumps Melchor Amour Evaluation id cerebral arteries   Past Medical History:  Diagnosis Date   Anxiety    Arthritis    Carotid artery disease (HCC)    COPD (chronic obstructive pulmonary disease) (HCC)    Coronary artery disease    COVID    either in 2021 or 2022   ED (erectile dysfunction)    GERD (gastroesophageal reflux disease)    Headache    hx 20 yrs ago per pt   HLD (hyperlipidemia)    Hypertension    Left bundle branch block (LBBB)    Lupus (HCC)    Pneumonia    Pre-diabetes    Psoriasis     Past Surgical History:  Procedure Laterality Date   CARDIAC CATHETERIZATION     IR 3D INDEPENDENT WKST  09/22/2020   IR 3D INDEPENDENT WKST  10/04/2020   IR 3D INDEPENDENT WKST  05/08/2021   IR ANGIO INTRA EXTRACRAN SEL COM CAROTID INNOMINATE UNI L MOD SED  04/17/2021   IR ANGIO INTRA EXTRACRAN SEL INTERNAL CAROTID BILAT MOD SED  09/22/2020   IR ANGIO INTRA EXTRACRAN SEL INTERNAL CAROTID UNI L MOD SED  10/03/2020   IR ANGIO INTRA EXTRACRAN SEL INTERNAL CAROTID UNI R MOD SED  04/17/2021   IR ANGIO INTRA EXTRACRAN SEL INTERNAL CAROTID UNI R MOD SED  05/11/2021   IR ANGIO VERTEBRAL SEL SUBCLAVIAN INNOMINATE UNI L MOD SED  09/22/2020   IR ANGIO VERTEBRAL SEL VERTEBRAL UNI L MOD SED  04/17/2021   IR ANGIO VERTEBRAL SEL VERTEBRAL UNI R MOD SED  09/22/2020   IR ANGIOGRAM FOLLOW UP STUDY  10/04/2020   IR ANGIOGRAM FOLLOW UP STUDY  10/03/2020   IR ANGIOGRAM FOLLOW UP STUDY  05/08/2021   IR ANGIOGRAM FOLLOW UP  STUDY  05/08/2021   IR CT HEAD LTD  10/03/2020   IR CT HEAD LTD  05/08/2021   IR NEURO EACH ADD'L AFTER BASIC UNI LEFT (MS)  10/05/2020   IR NEURO EACH ADD'L AFTER BASIC UNI RIGHT (MS)  05/11/2021   IR RADIOLOGIST EVAL & MGMT  05/05/2021   IR TRANSCATH/EMBOLIZ  10/03/2020   IR TRANSCATH/EMBOLIZ  05/08/2021   IR US GUIDE VASC ACCESS RIGHT  09/22/2020   IR US GUIDE VASC ACCESS RIGHT  10/03/2020   IR US GUIDE VASC ACCESS RIGHT  04/17/2021   IR US GUIDE VASC ACCESS RIGHT  05/08/2021   RADIOLOGY WITH ANESTHESIA N/A 10/03/2020   Procedure: EMBOLIZATION WITH ANESTHESIA;  Surgeon: Baldemar Lenis, MD;  Location: [facility] OR;  Service: Radiology;  Laterality: N/A;   RADIOLOGY WITH ANESTHESIA N/A 05/08/2021   Procedure: EMBOLIZATION;  Surgeon: Baldemar Lenis, MD;  Location: [facility] OR;  Service: Radiology;  Laterality: N/A;    Allergies: Lipitor [atorvastatin]  Medications: Prior to Admission medications   Medication Sig Start Date End Date Taking? Authorizing Provider  aspirin EC 81 MG tablet Take 81 mg by mouth daily. Swallow whole.   Yes [provider]  Calcium Carb-Cholecalciferol (CALCIUM 600 + D PO) Take 1 tablet by mouth  2 (two) times daily.   Yes [provider]  cetirizine (ZYRTEC) 10 MG tablet Take 10 mg by mouth daily.   Yes [provider]  chlorpheniramine-HYDROcodone (TUSSIONEX) 10-8 MG/5ML Take 5 mLs by mouth every 12 (twelve) hours as needed for cough.   Yes [provider]  ipratropium (ATROVENT) 0.06 % nasal spray Place 2 sprays into both nostrils daily as needed for rhinitis. 07/21/21 07/21/22 Yes [provider]  Ixekizumab (TALTZ) 80 MG/ML SOSY Inject 80 mg into the skin every 30 (thirty) days.   Yes [provider]  losartan (COZAAR) 100 MG tablet Take 100 mg by mouth at bedtime. 07/18/20  Yes [provider]  pantoprazole (PROTONIX) 20 MG tablet Take 20 mg by mouth daily with supper. 03/29/21  Yes [provider]  PARoxetine (PAXIL) 10 MG tablet Take 10 mg by mouth daily.   Yes [provider]  predniSONE (DELTASONE) 10 MG tablet Take 20 mg by mouth daily.   Yes [provider]  rosuvastatin (CRESTOR) 20 MG tablet Take 20 mg by mouth daily with supper. 08/10/20  Yes [provider]  triamcinolone (NASACORT) 55 MCG/ACT AERO nasal inhaler Place 2 sprays into the nose daily as needed (allergies). 02/23/21  Yes [provider]  acetaminophen (TYLENOL) 500 MG tablet Take 1,000 mg by mouth every 6 (six) hours as needed for moderate pain.    [provider]  Acetylcysteine (NAC 600) 600 MG CAPS Take 600 mg by mouth 2 (two) times daily.    [provider]  albuterol (VENTOLIN HFA) 108 (90 Base) MCG/ACT inhaler Inhale 2 puffs into the lungs every 6 (six) hours as needed for shortness of breath. 07/21/21   [provider]  Cyanocobalamin (B-12) 1000 MCG TABS Take 1,000 mcg by mouth at bedtime. 07/02/20   [provider]     History reviewed. No pertinent family history.  Social History   Socioeconomic History   Marital status: Married    Spouse name: Not on file   Number of children: Not on file   Years of education: Not on file   Highest education level: Not on file  Occupational History   Not on file  Tobacco Use   Smoking status: Former    Packs/day: 1.00    Years: 37.00    Total pack years: 37.00    Types: Cigarettes    Quit date: 10/04/2008    Years since quitting: 13.0   Smokeless tobacco: Never  Vaping Use   Vaping Use: Never used  Substance and Sexual Activity   Alcohol use: Yes    Alcohol/week: 2.0 standard drinks of alcohol    Types: 2 Cans of beer per week   Drug use: Not Currently   Sexual activity: Not Currently  Other Topics Concern   Not on file  Social History Narrative   Not on file   Social Determinants of Health   Financial Resource Strain: Not on file  Food Insecurity: Not on file   Transportation Needs: Not on file  Physical Activity: Not on file  Stress: Not on file  Social Connections: Not on file    Review of Systems: A 12 point ROS discussed and pertinent positives are indicated in the HPI above.  All other systems are negative.  Review of Systems  Constitutional:  Negative for activity change, fatigue and fever.  HENT:  Negative for trouble swallowing.   Eyes:  Negative for visual disturbance.  Respiratory:  Negative for cough and  shortness of breath.   Cardiovascular:  Negative for chest pain.  Gastrointestinal:  Negative for abdominal pain.  Musculoskeletal:  Negative for back pain.  Neurological:  Negative for dizziness, tremors, seizures, syncope, facial asymmetry, speech difficulty, weakness, light-headedness, numbness and headaches.  Psychiatric/Behavioral:  Negative for behavioral problems and confusion.     Vital Signs: BP (!) 161/88   Pulse (!) 58   Temp (!) 97.5 F (36.4 C)   Resp 18   Ht 6\' 1"  (1.854 m)   Wt 195 lb (88.5 kg)   SpO2 97%   BMI 25.73 kg/m    Physical Exam Vitals reviewed.  HENT:     Mouth/Throat:     Mouth: Mucous membranes are moist.  Eyes:     Extraocular Movements: Extraocular movements intact.  Cardiovascular:     Rate and Rhythm: Normal rate and regular rhythm.     Heart sounds: Normal heart sounds.  Pulmonary:     Effort: Pulmonary effort is normal.     Breath sounds: Normal breath sounds. No wheezing.  Abdominal:     Palpations: Abdomen is soft.  Musculoskeletal:        General: Normal range of motion.  Skin:    General: Skin is warm.  Neurological:     Mental Status: He is oriented to person, place, and time.  Psychiatric:        Behavior: Behavior normal.     Imaging: No results found.  Labs:  CBC: Recent Labs    04/14/21 0949 05/08/21 0714 10/17/21 0815  WBC 5.9 5.9 7.8  HGB 13.3 12.5* 14.4  HCT 39.0 37.5* 44.0  PLT 202 244 248    COAGS: Recent Labs    04/14/21 0949  05/08/21 0714 10/17/21 0815  INR 1.0 1.0 1.0    BMP: Recent Labs    04/14/21 0949 05/08/21 0714 10/17/21 0815  NA 137 139 142  K 3.9 4.2 4.8  CL 109 107 104  CO2 22 20* 27  GLUCOSE 104* 104* 94  BUN 21 23 22   CALCIUM 9.1 8.6* 8.7*  CREATININE 1.13 1.20 1.18  GFRNONAA >60 >60 >60    LIVER FUNCTION TESTS: No results for input(s): "BILITOT", "AST", "ALT", "ALKPHOS", "PROT", "ALBUMIN" in the last 8760 hours.  TUMOR MARKERS: No results for input(s): "AFPTM", "CEA", "CA199", "CHROMGRNA" in the last 8760 hours.  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  This interventional procedure involves the use of X-rays and because of the nature of the planned procedure, it is possible that we will have prolonged use of X-ray fluoroscopy.  Potential radiation risks to you include (but are not limited to) the following: - A slightly elevated risk for cancer  several years later in life. This risk is typically less than 0.5% percent. This risk is low in comparison to the normal incidence of human cancer, which is 33% for women and 50% for men according to the American Cancer Society. - Radiation induced injury can include skin redness, resembling a rash, tissue breakdown / ulcers and hair loss (which can be temporary or permanent).   The likelihood of either of these occurring depends on the difficulty of the procedure and whether you are sensitive to  radiation due to previous procedures, disease, or genetic conditions.   IF your procedure requires a prolonged use of radiation, you will be notified and given written instructions for  further action.  It is your responsibility to monitor the irradiated area for the 2 weeks following the procedure and to notify your physician if you are concerned that you have suffered a radiation induced injury.    All of the patient's questions were answered, patient is agreeable to proceed.  Consent signed and in chart.    Thank you for this interesting consult.  I greatly enjoyed meeting Marck Mcclenny. and look forward to participating in their care.  A copy of this report was sent to the requesting provider on this date.  Electronically Signed: Robet Leu, PA-C 10/17/2021, 9:31 AM   I spent a total of    25 Minutes in face to face in clinical consultation, greater than 50% of which was counseling/coordinating care for cerebral arteriogram

## 2021-10-17 NOTE — Progress Notes (Signed)
Wife called and notified about the time to arrive to the front of hospital for pt pick-up. As well as we will call to give d/c instructions verbally. All questions and concerns addressed at this time.

## 2021-10-30 ENCOUNTER — Encounter (INDEPENDENT_AMBULATORY_CARE_PROVIDER_SITE_OTHER): Payer: Self-pay

## 2021-11-30 ENCOUNTER — Other Ambulatory Visit: Payer: Self-pay | Admitting: Pulmonary Disease

## 2021-11-30 DIAGNOSIS — R911 Solitary pulmonary nodule: Secondary | ICD-10-CM

## 2021-11-30 DIAGNOSIS — J449 Chronic obstructive pulmonary disease, unspecified: Secondary | ICD-10-CM

## 2021-12-13 ENCOUNTER — Ambulatory Visit
Admission: RE | Admit: 2021-12-13 | Discharge: 2021-12-13 | Disposition: A | Discharge status: Home / Self Care | Payer: Medicare PPO | Source: Ambulatory Visit | Attending: Pulmonary Disease | Admitting: Pulmonary Disease

## 2021-12-13 DIAGNOSIS — R911 Solitary pulmonary nodule: Secondary | ICD-10-CM | POA: Insufficient documentation

## 2021-12-13 DIAGNOSIS — J449 Chronic obstructive pulmonary disease, unspecified: Secondary | ICD-10-CM | POA: Diagnosis present

## 2022-03-06 ENCOUNTER — Other Ambulatory Visit: Payer: Self-pay | Admitting: Otolaryngology

## 2022-03-06 DIAGNOSIS — R42 Dizziness and giddiness: Secondary | ICD-10-CM

## 2022-03-21 ENCOUNTER — Ambulatory Visit
Admission: RE | Admit: 2022-03-21 | Discharge: 2022-03-21 | Disposition: A | Discharge status: Home / Self Care | Payer: Medicare PPO | Source: Ambulatory Visit | Attending: Otolaryngology | Admitting: Otolaryngology

## 2022-03-21 DIAGNOSIS — R42 Dizziness and giddiness: Secondary | ICD-10-CM

## 2022-03-21 MED ORDER — GADOPICLENOL 0.5 MMOL/ML IV SOLN
9.0000 mL | Freq: Once | INTRAVENOUS | Status: AC | PRN
  Administered 2022-03-21: 9 mL via INTRAVENOUS

## 2022-03-29 ENCOUNTER — Other Ambulatory Visit: Payer: Medicare PPO

## 2022-08-27 ENCOUNTER — Ambulatory Visit (INDEPENDENT_AMBULATORY_CARE_PROVIDER_SITE_OTHER): Payer: Medicare PPO | Admitting: Vascular Surgery

## 2022-08-27 ENCOUNTER — Encounter (INDEPENDENT_AMBULATORY_CARE_PROVIDER_SITE_OTHER): Payer: Self-pay | Admitting: Vascular Surgery

## 2022-08-27 ENCOUNTER — Ambulatory Visit (INDEPENDENT_AMBULATORY_CARE_PROVIDER_SITE_OTHER): Payer: Medicare PPO

## 2022-08-27 ENCOUNTER — Other Ambulatory Visit (INDEPENDENT_AMBULATORY_CARE_PROVIDER_SITE_OTHER): Payer: Self-pay | Admitting: Vascular Surgery

## 2022-08-27 VITALS — BP 143/78 | HR 60 | Resp 16 | Wt 195.4 lb

## 2022-08-27 DIAGNOSIS — R55 Syncope and collapse: Secondary | ICD-10-CM

## 2022-08-27 DIAGNOSIS — I770 Arteriovenous fistula, acquired: Secondary | ICD-10-CM | POA: Diagnosis not present

## 2022-08-27 DIAGNOSIS — I10 Essential (primary) hypertension: Secondary | ICD-10-CM

## 2022-08-27 DIAGNOSIS — I779 Disorder of arteries and arterioles, unspecified: Secondary | ICD-10-CM

## 2022-08-27 DIAGNOSIS — I6523 Occlusion and stenosis of bilateral carotid arteries: Secondary | ICD-10-CM

## 2022-08-27 DIAGNOSIS — J449 Chronic obstructive pulmonary disease, unspecified: Secondary | ICD-10-CM

## 2022-08-27 HISTORY — DX: Disorder of arteries and arterioles, unspecified: I77.9

## 2022-08-27 NOTE — H&P (View-Only) (Signed)
MRN : [id number]  Timothy Reeves. is a 67 y.o. (March 07, 1955) male who presents with chief complaint of check carotid arteries.  History of Present Illness:   The patient is seen for follow up evaluation of carotid stenosis. The carotid stenosis followed by ultrasound.    The patient denies amaurosis fugax. There is no recent history of TIA symptoms or focal motor deficits. There is no prior documented CVA.   The patient is taking enteric-coated aspirin 81 mg daily.   There is no history of migraine headaches. There is no history of seizures.   The patient has a history of coronary artery disease, no recent episodes of angina or shortness of breath. The patient denies PAD or claudication symptoms. There is a history of hyperlipidemia which is being treated with a statin.     No family history of AAA.    Patient denies amaurosis fugax or TIA symptoms. There is no history of claudication or rest pain symptoms of the lower extremities.  The patient denies angina or shortness of breath.   CT scan of the chest 07/07/2020 is negative for aneurysmal disease.      Previous duplex ultrasound of the abdominal aorta date 03/07/2020 is negative for AAA   Carotid Duplex done today shows <39% bilaterally, no change compared to previous study 01/16/2021.   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  No outpatient medications have been marked as taking for the 08/27/22 encounter (Appointment) with Gilda Crease, Latina Craver, MD.    Past Medical History:  Diagnosis Date   Anxiety    Arthritis    Carotid artery disease (HCC)    COPD (chronic obstructive pulmonary disease) (HCC)    Coronary artery disease    COVID    either in 2021 or 2022   ED (erectile dysfunction)    GERD (gastroesophageal reflux disease)    Headache    hx 20 yrs ago per pt   HLD (hyperlipidemia)     Hypertension    Left bundle branch block (LBBB)    Lupus (HCC)    Pneumonia    Pre-diabetes    Psoriasis     Past Surgical History:  Procedure Laterality Date   CARDIAC CATHETERIZATION     IR 3D INDEPENDENT WKST  09/22/2020   IR 3D INDEPENDENT WKST  10/04/2020   IR 3D INDEPENDENT WKST  05/08/2021   IR ANGIO INTRA EXTRACRAN SEL COM CAROTID INNOMINATE UNI L MOD SED  04/17/2021   IR ANGIO INTRA EXTRACRAN SEL INTERNAL CAROTID BILAT MOD SED  09/22/2020   IR ANGIO INTRA EXTRACRAN SEL INTERNAL CAROTID BILAT MOD SED  10/17/2021   IR ANGIO INTRA EXTRACRAN SEL INTERNAL CAROTID UNI L MOD SED  10/03/2020   IR ANGIO INTRA EXTRACRAN SEL INTERNAL CAROTID UNI R MOD SED  04/17/2021   IR ANGIO INTRA EXTRACRAN SEL INTERNAL CAROTID UNI R MOD SED  05/11/2021   IR ANGIO VERTEBRAL SEL SUBCLAVIAN INNOMINATE UNI L MOD SED  09/22/2020   IR ANGIO VERTEBRAL SEL SUBCLAVIAN INNOMINATE UNI L MOD SED  10/17/2021   IR ANGIO VERTEBRAL SEL VERTEBRAL UNI L MOD SED  04/17/2021   IR ANGIO VERTEBRAL SEL VERTEBRAL UNI R MOD SED  09/22/2020   IR ANGIO VERTEBRAL SEL VERTEBRAL UNI R MOD SED  10/17/2021  IR ANGIOGRAM FOLLOW UP STUDY  10/04/2020   IR ANGIOGRAM FOLLOW UP STUDY  10/03/2020   IR ANGIOGRAM FOLLOW UP STUDY  05/08/2021   IR ANGIOGRAM FOLLOW UP STUDY  05/08/2021   IR CT HEAD LTD  10/03/2020   IR CT HEAD LTD  05/08/2021   IR NEURO EACH ADD'L AFTER BASIC UNI LEFT (MS)  10/05/2020   IR NEURO EACH ADD'L AFTER BASIC UNI RIGHT (MS)  05/11/2021   IR RADIOLOGIST EVAL & MGMT  05/05/2021   IR TRANSCATH/EMBOLIZ  10/03/2020   IR TRANSCATH/EMBOLIZ  05/08/2021   IR US GUIDE VASC ACCESS RIGHT  09/22/2020   IR US GUIDE VASC ACCESS RIGHT  10/03/2020   IR US GUIDE VASC ACCESS RIGHT  04/17/2021   IR US GUIDE VASC ACCESS RIGHT  05/08/2021   IR US GUIDE VASC ACCESS RIGHT  10/17/2021   RADIOLOGY WITH ANESTHESIA N/A 10/03/2020   Procedure: EMBOLIZATION WITH ANESTHESIA;  Surgeon: Baldemar Lenis, MD;  Location: [facility] OR;  Service: Radiology;  Laterality:  N/A;   RADIOLOGY WITH ANESTHESIA N/A 05/08/2021   Procedure: EMBOLIZATION;  Surgeon: Baldemar Lenis, MD;  Location: [facility] OR;  Service: Radiology;  Laterality: N/A;    Social History Social History   Tobacco Use   Smoking status: Former    Current packs/day: 0.00    Average packs/day: 1 pack/day for 37.0 years (37.0 ttl pk-yrs)    Types: Cigarettes    Start date: 10/05/1971    Quit date: 10/04/2008    Years since quitting: 13.9   Smokeless tobacco: Never  Vaping Use   Vaping status: Never Used  Substance Use Topics   Alcohol use: Yes    Alcohol/week: 2.0 standard drinks of alcohol    Types: 2 Cans of beer per week   Drug use: Not Currently    Family History No family history on file.  Allergies  Allergen Reactions   Lipitor [Atorvastatin] Other (See Comments)    Muscle aches     REVIEW OF SYSTEMS (Negative unless checked)  Constitutional: [] Weight loss  [] Fever  [] Chills Cardiac: [] Chest pain   [] Chest pressure   [] Palpitations   [] Shortness of breath when laying flat   [] Shortness of breath with exertion. Vascular:  [x] Pain in legs with walking   [] Pain in legs at rest  [] History of DVT   [] Phlebitis   [] Swelling in legs   [] Varicose veins   [] Non-healing ulcers Pulmonary:   [] Uses home oxygen   [] Productive cough   [] Hemoptysis   [] Wheeze  [] COPD   [] Asthma Neurologic:  [] Dizziness   [] Seizures   [] History of stroke   [] History of TIA  [] Aphasia   [] Vissual changes   [] Weakness or numbness in arm   [] Weakness or numbness in leg Musculoskeletal:   [] Joint swelling   [] Joint pain   [] Low back pain Hematologic:  [] Easy bruising  [] Easy bleeding   [] Hypercoagulable state   [] Anemic Gastrointestinal:  [] Diarrhea   [] Vomiting  [] Gastroesophageal reflux/heartburn   [] Difficulty swallowing. Genitourinary:  [] Chronic kidney disease   [] Difficult urination  [] Frequent urination   [] Blood in urine Skin:  [] Rashes   [] Ulcers  Psychological:  [] History of anxiety   []   History of major depression.  Physical Examination  There were no vitals filed for this visit. There is no height or weight on file to calculate BMI. Gen: WD/WN, NAD Head: [place]/AT, No temporalis wasting.  Ear/Nose/Throat: Hearing grossly intact, nares w/o erythema or drainage Eyes: PER, EOMI, sclera nonicteric.  Neck:  Supple, no masses.  No bruit or JVD.  Pulmonary:  Good air movement, no audible wheezing, no use of accessory muscles.  Cardiac: RRR, normal S1, S2, no Murmurs. Vascular:  carotid bruit noted Vessel Right Left  Radial Palpable Palpable  Carotid  Palpable  Palpable  Subclav  Palpable Palpable  Gastrointestinal: soft, non-distended. No guarding/no peritoneal signs.  Musculoskeletal: M/S 5/5 throughout.  No visible deformity.  Neurologic: CN 2-12 intact. Pain and light touch intact in extremities.  Symmetrical.  Speech is fluent. Motor exam as listed above. Psychiatric: Judgment intact, Mood & affect appropriate for pt's clinical situation. Dermatologic: No rashes or ulcers noted.  No changes consistent with cellulitis.   CBC Lab Results  Component Value Date   WBC 7.8 10/17/2021   HGB 14.4 10/17/2021   HCT 44.0 10/17/2021   MCV 90.2 10/17/2021   PLT 248 10/17/2021    BMET    Component Value Date/Time   NA 142 10/17/2021 0815   K 4.8 10/17/2021 0815   CL 104 10/17/2021 0815   CO2 27 10/17/2021 0815   GLUCOSE 94 10/17/2021 0815   BUN 22 10/17/2021 0815   CREATININE 1.18 10/17/2021 0815   CALCIUM 8.7 (L) 10/17/2021 0815   GFRNONAA >60 10/17/2021 0815   CrCl cannot be calculated (Patient's most recent lab result is older than the maximum 21 days allowed.).  COAG Lab Results  Component Value Date   INR 1.0 10/17/2021   INR 1.0 05/08/2021   INR 1.0 04/14/2021    Radiology No results found.   Assessment/Plan 1. Bilateral carotid artery stenosis Recommend:  Given the patient's asymptomatic subcritical stenosis no further invasive testing or surgery  at this time.  Duplex ultrasound shows 1-39% stenosis bilaterally.  Continue antiplatelet therapy as prescribed Continue management of CAD, HTN and Hyperlipidemia Healthy heart diet,  encouraged exercise at least 4 times per week Follow up in 12 months with duplex ultrasound and physical exam  - VAS US CAROTID; Future  2. Syncope, unspecified syncope type In the looking at the patient's duplex ultrasound today which shows normal vertebral artery flow bilaterally as well as minimal bilateral carotid artery stenosis it is unlikely that his syncope is related to vascular insufficiency.  Furthermore, MRA of the head dated March 24, 2022 demonstrated normal distal carotids as well as normal distal vertebral arteries with a normal basilar artery and circle of Willis.  Again it appears unlikely that his syncope is related to vascular insufficiency.  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  He is also relating that it is perhaps more tender and is worried about traumatizing this area inadvertently.  Given these concerns I will rescan his right arm and if indeed we have documented significant increase in size then repair would be indicated.  - VAS [place] UPPER EXTREMITY ARTERIAL DUPLEX; Future  4. Essential hypertension Continue antihypertensive medications as already ordered, these medications have been reviewed and there are no changes at this time.  5. COPD, mild (HCC) Continue pulmonary medications and aerosols as already ordered, these medications have been reviewed and there are no changes at this time.     Levora Dredge, MD  08/27/2022 8:46 AM

## 2022-08-27 NOTE — Progress Notes (Unsigned)
MRN : [id number]  Timothy Reeves. is a 67 y.o. (1955-02-18) male who presents with chief complaint of check carotid arteries.  History of Present Illness:   The patient is seen for follow up evaluation of carotid stenosis. The carotid stenosis followed by ultrasound.    The patient denies amaurosis fugax. There is no recent history of TIA symptoms or focal motor deficits. There is no prior documented CVA.   The patient is taking enteric-coated aspirin 81 mg daily.   There is no history of migraine headaches. There is no history of seizures.   The patient has a history of coronary artery disease, no recent episodes of angina or shortness of breath. The patient denies PAD or claudication symptoms. There is a history of hyperlipidemia which is being treated with a statin.     No family history of AAA.    Patient denies amaurosis fugax or TIA symptoms. There is no history of claudication or rest pain symptoms of the lower extremities.  The patient denies angina or shortness of breath.   CT scan of the chest 07/07/2020 is negative for aneurysmal disease.      Previous duplex ultrasound of the abdominal aorta date 03/07/2020 is negative for AAA   Carotid Duplex done today shows <39% bilaterally, no change compared to previous study 01/16/2021.   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  No outpatient medications have been marked as taking for the 08/27/22 encounter (Appointment) with Gilda Crease, Latina Craver, MD.    Past Medical History:  Diagnosis Date   Anxiety    Arthritis    Carotid artery disease (HCC)    COPD (chronic obstructive pulmonary disease) (HCC)    Coronary artery disease    COVID    either in 2021 or 2022   ED (erectile dysfunction)    GERD (gastroesophageal reflux disease)    Headache    hx 20 yrs ago per pt   HLD (hyperlipidemia)     Hypertension    Left bundle branch block (LBBB)    Lupus (HCC)    Pneumonia    Pre-diabetes    Psoriasis     Past Surgical History:  Procedure Laterality Date   CARDIAC CATHETERIZATION     IR 3D INDEPENDENT WKST  09/22/2020   IR 3D INDEPENDENT WKST  10/04/2020   IR 3D INDEPENDENT WKST  05/08/2021   IR ANGIO INTRA EXTRACRAN SEL COM CAROTID INNOMINATE UNI L MOD SED  04/17/2021   IR ANGIO INTRA EXTRACRAN SEL INTERNAL CAROTID BILAT MOD SED  09/22/2020   IR ANGIO INTRA EXTRACRAN SEL INTERNAL CAROTID BILAT MOD SED  10/17/2021   IR ANGIO INTRA EXTRACRAN SEL INTERNAL CAROTID UNI L MOD SED  10/03/2020   IR ANGIO INTRA EXTRACRAN SEL INTERNAL CAROTID UNI R MOD SED  04/17/2021   IR ANGIO INTRA EXTRACRAN SEL INTERNAL CAROTID UNI R MOD SED  05/11/2021   IR ANGIO VERTEBRAL SEL SUBCLAVIAN INNOMINATE UNI L MOD SED  09/22/2020   IR ANGIO VERTEBRAL SEL SUBCLAVIAN INNOMINATE UNI L MOD SED  10/17/2021   IR ANGIO VERTEBRAL SEL VERTEBRAL UNI L MOD SED  04/17/2021   IR ANGIO VERTEBRAL SEL VERTEBRAL UNI R MOD SED  09/22/2020   IR ANGIO VERTEBRAL SEL VERTEBRAL UNI R MOD SED  10/17/2021  IR ANGIOGRAM FOLLOW UP STUDY  10/04/2020   IR ANGIOGRAM FOLLOW UP STUDY  10/03/2020   IR ANGIOGRAM FOLLOW UP STUDY  05/08/2021   IR ANGIOGRAM FOLLOW UP STUDY  05/08/2021   IR CT HEAD LTD  10/03/2020   IR CT HEAD LTD  05/08/2021   IR NEURO EACH ADD'L AFTER BASIC UNI LEFT (MS)  10/05/2020   IR NEURO EACH ADD'L AFTER BASIC UNI RIGHT (MS)  05/11/2021   IR RADIOLOGIST EVAL & MGMT  05/05/2021   IR TRANSCATH/EMBOLIZ  10/03/2020   IR TRANSCATH/EMBOLIZ  05/08/2021   IR US GUIDE VASC ACCESS RIGHT  09/22/2020   IR US GUIDE VASC ACCESS RIGHT  10/03/2020   IR US GUIDE VASC ACCESS RIGHT  04/17/2021   IR US GUIDE VASC ACCESS RIGHT  05/08/2021   IR US GUIDE VASC ACCESS RIGHT  10/17/2021   RADIOLOGY WITH ANESTHESIA N/A 10/03/2020   Procedure: EMBOLIZATION WITH ANESTHESIA;  Surgeon: Baldemar Lenis, MD;  Location: [facility] OR;  Service: Radiology;  Laterality:  N/A;   RADIOLOGY WITH ANESTHESIA N/A 05/08/2021   Procedure: EMBOLIZATION;  Surgeon: Baldemar Lenis, MD;  Location: [facility] OR;  Service: Radiology;  Laterality: N/A;    Social History Social History   Tobacco Use   Smoking status: Former    Current packs/day: 0.00    Average packs/day: 1 pack/day for 37.0 years (37.0 ttl pk-yrs)    Types: Cigarettes    Start date: 10/05/1971    Quit date: 10/04/2008    Years since quitting: 13.9   Smokeless tobacco: Never  Vaping Use   Vaping status: Never Used  Substance Use Topics   Alcohol use: Yes    Alcohol/week: 2.0 standard drinks of alcohol    Types: 2 Cans of beer per week   Drug use: Not Currently    Family History No family history on file.  Allergies  Allergen Reactions   Lipitor [Atorvastatin] Other (See Comments)    Muscle aches     REVIEW OF SYSTEMS (Negative unless checked)  Constitutional: [] Weight loss  [] Fever  [] Chills Cardiac: [] Chest pain   [] Chest pressure   [] Palpitations   [] Shortness of breath when laying flat   [] Shortness of breath with exertion. Vascular:  [x] Pain in legs with walking   [] Pain in legs at rest  [] History of DVT   [] Phlebitis   [] Swelling in legs   [] Varicose veins   [] Non-healing ulcers Pulmonary:   [] Uses home oxygen   [] Productive cough   [] Hemoptysis   [] Wheeze  [] COPD   [] Asthma Neurologic:  [] Dizziness   [] Seizures   [] History of stroke   [] History of TIA  [] Aphasia   [] Vissual changes   [] Weakness or numbness in arm   [] Weakness or numbness in leg Musculoskeletal:   [] Joint swelling   [] Joint pain   [] Low back pain Hematologic:  [] Easy bruising  [] Easy bleeding   [] Hypercoagulable state   [] Anemic Gastrointestinal:  [] Diarrhea   [] Vomiting  [] Gastroesophageal reflux/heartburn   [] Difficulty swallowing. Genitourinary:  [] Chronic kidney disease   [] Difficult urination  [] Frequent urination   [] Blood in urine Skin:  [] Rashes   [] Ulcers  Psychological:  [] History of anxiety   []   History of major depression.  Physical Examination  There were no vitals filed for this visit. There is no height or weight on file to calculate BMI. Gen: WD/WN, NAD Head: [place]/AT, No temporalis wasting.  Ear/Nose/Throat: Hearing grossly intact, nares w/o erythema or drainage Eyes: PER, EOMI, sclera nonicteric.  Neck:  Supple, no masses.  No bruit or JVD.  Pulmonary:  Good air movement, no audible wheezing, no use of accessory muscles.  Cardiac: RRR, normal S1, S2, no Murmurs. Vascular:  carotid bruit noted Vessel Right Left  Radial Palpable Palpable  Carotid  Palpable  Palpable  Subclav  Palpable Palpable  Gastrointestinal: soft, non-distended. No guarding/no peritoneal signs.  Musculoskeletal: M/S 5/5 throughout.  No visible deformity.  Neurologic: CN 2-12 intact. Pain and light touch intact in extremities.  Symmetrical.  Speech is fluent. Motor exam as listed above. Psychiatric: Judgment intact, Mood & affect appropriate for pt's clinical situation. Dermatologic: No rashes or ulcers noted.  No changes consistent with cellulitis.   CBC Lab Results  Component Value Date   WBC 7.8 10/17/2021   HGB 14.4 10/17/2021   HCT 44.0 10/17/2021   MCV 90.2 10/17/2021   PLT 248 10/17/2021    BMET    Component Value Date/Time   NA 142 10/17/2021 0815   K 4.8 10/17/2021 0815   CL 104 10/17/2021 0815   CO2 27 10/17/2021 0815   GLUCOSE 94 10/17/2021 0815   BUN 22 10/17/2021 0815   CREATININE 1.18 10/17/2021 0815   CALCIUM 8.7 (L) 10/17/2021 0815   GFRNONAA >60 10/17/2021 0815   CrCl cannot be calculated (Patient's most recent lab result is older than the maximum 21 days allowed.).  COAG Lab Results  Component Value Date   INR 1.0 10/17/2021   INR 1.0 05/08/2021   INR 1.0 04/14/2021    Radiology No results found.   Assessment/Plan There are no diagnoses linked to this encounter.   Levora Dredge, MD  08/27/2022 8:46 AM

## 2022-08-28 ENCOUNTER — Encounter (INDEPENDENT_AMBULATORY_CARE_PROVIDER_SITE_OTHER): Payer: Self-pay | Admitting: Vascular Surgery

## 2022-08-28 ENCOUNTER — Ambulatory Visit (INDEPENDENT_AMBULATORY_CARE_PROVIDER_SITE_OTHER): Payer: Medicare PPO

## 2022-08-28 DIAGNOSIS — I770 Arteriovenous fistula, acquired: Secondary | ICD-10-CM

## 2022-08-28 DIAGNOSIS — R55 Syncope and collapse: Secondary | ICD-10-CM | POA: Insufficient documentation

## 2022-09-10 ENCOUNTER — Telehealth (INDEPENDENT_AMBULATORY_CARE_PROVIDER_SITE_OTHER): Payer: Self-pay

## 2022-09-10 ENCOUNTER — Other Ambulatory Visit (INDEPENDENT_AMBULATORY_CARE_PROVIDER_SITE_OTHER): Payer: Self-pay | Admitting: Nurse Practitioner

## 2022-09-10 DIAGNOSIS — I770 Arteriovenous fistula, acquired: Secondary | ICD-10-CM

## 2022-09-10 NOTE — Telephone Encounter (Signed)
I attempted to contact the patient to schedule him for a right radial artery repair at the [facility]. Pre-op is on 09/11/22 at 2:00 pm at the [facility]. Pre-surgical instructions were discussed and patient stated he wrote them down. I did ask the patient to call back if he needed to be given this information again. Patient does have Mychart and stated he does not use Mychart because of the difficulty of it.

## 2022-09-11 ENCOUNTER — Telehealth (INDEPENDENT_AMBULATORY_CARE_PROVIDER_SITE_OTHER): Payer: Self-pay | Admitting: Vascular Surgery

## 2022-09-11 ENCOUNTER — Telehealth (INDEPENDENT_AMBULATORY_CARE_PROVIDER_SITE_OTHER): Payer: Self-pay

## 2022-09-11 ENCOUNTER — Encounter
Admission: RE | Admit: 2022-09-11 | Discharge: 2022-09-11 | Disposition: A | Discharge status: Home / Self Care | Payer: Medicare PPO | Source: Ambulatory Visit | Attending: Vascular Surgery | Admitting: Vascular Surgery

## 2022-09-11 ENCOUNTER — Other Ambulatory Visit: Payer: Self-pay

## 2022-09-11 ENCOUNTER — Other Ambulatory Visit (INDEPENDENT_AMBULATORY_CARE_PROVIDER_SITE_OTHER): Payer: Self-pay | Admitting: Vascular Surgery

## 2022-09-11 DIAGNOSIS — R9431 Abnormal electrocardiogram [ECG] [EKG]: Secondary | ICD-10-CM | POA: Diagnosis not present

## 2022-09-11 DIAGNOSIS — Z01810 Encounter for preprocedural cardiovascular examination: Secondary | ICD-10-CM

## 2022-09-11 DIAGNOSIS — Z01812 Encounter for preprocedural laboratory examination: Secondary | ICD-10-CM | POA: Insufficient documentation

## 2022-09-11 DIAGNOSIS — I770 Arteriovenous fistula, acquired: Secondary | ICD-10-CM

## 2022-09-11 DIAGNOSIS — Z01818 Encounter for other preprocedural examination: Secondary | ICD-10-CM | POA: Diagnosis present

## 2022-09-11 HISTORY — DX: Long term (current) use of aspirin: Z79.82

## 2022-09-11 HISTORY — DX: Other cardiomyopathies: I42.8

## 2022-09-11 HISTORY — DX: Arteriovenous fistula, acquired: I77.0

## 2022-09-11 HISTORY — DX: Anemia, unspecified: D64.9

## 2022-09-11 HISTORY — DX: Other long term (current) drug therapy: Z79.899

## 2022-09-11 HISTORY — DX: Arthropathic psoriasis, unspecified: L40.50

## 2022-09-11 HISTORY — DX: Long term (current) use of unspecified immunomodulators and immunosuppressants: Z79.60

## 2022-09-11 HISTORY — DX: Discoid lupus erythematosus: L93.0

## 2022-09-11 LAB — BASIC METABOLIC PANEL
Anion gap: 6 (ref 5–15)
BUN: 21 mg/dL (ref 8–23)
CO2: 21 mmol/L — ABNORMAL LOW (ref 22–32)
Calcium: 8.9 mg/dL (ref 8.9–10.3)
Chloride: 111 mmol/L (ref 98–111)
Creatinine, Ser: 1.13 mg/dL (ref 0.61–1.24)
GFR, Estimated: >60 mL/min (ref >60)
Glucose, Bld: 105 mg/dL — ABNORMAL HIGH (ref 70–99)
Potassium: 4.3 mmol/L (ref 3.5–5.1)
Sodium: 138 mmol/L (ref 135–145)

## 2022-09-11 LAB — CBC WITH DIFFERENTIAL/PLATELET
Abs Immature Granulocytes: 0.01 10*3/uL (ref 0.00–0.07)
Basophils Absolute: 0 10*3/uL (ref 0.0–0.1)
Basophils Relative: 1 %
Eosinophils Absolute: 0.2 10*3/uL (ref 0.0–0.5)
Eosinophils Relative: 4 %
HCT: 39.8 % (ref 39.0–52.0)
Hemoglobin: 13.4 g/dL (ref 13.0–17.0)
Immature Granulocytes: 0 %
Lymphocytes Relative: 22 %
Lymphs Abs: 1.1 10*3/uL (ref 0.7–4.0)
MCH: 30.2 pg (ref 26.0–34.0)
MCHC: 33.7 g/dL (ref 30.0–36.0)
MCV: 89.8 fL (ref 80.0–100.0)
Monocytes Absolute: 0.5 10*3/uL (ref 0.1–1.0)
Monocytes Relative: 10 %
Neutro Abs: 3.1 10*3/uL (ref 1.7–7.7)
Neutrophils Relative %: 63 %
Platelets: 236 10*3/uL (ref 150–400)
RBC: 4.43 MIL/uL (ref 4.22–5.81)
RDW: 12.8 % (ref 11.5–15.5)
WBC: 4.9 10*3/uL (ref 4.0–10.5)
nRBC: 0 % (ref 0.0–0.2)

## 2022-09-11 LAB — TYPE AND SCREEN
ABO/RH(D): O POS
Antibody Screen: NEGATIVE

## 2022-09-11 NOTE — Telephone Encounter (Signed)
Patient wanted to be referred to DR. Parashos instead of [place]:   Sent to Dr. Gilda Crease: Hey can you put in a referral for Paraschos at [facility] for this patient please. He called stated he was referred to [facility] and he has a cardiologist at [facility] instead. Thank you!   Dr. Gilda Crease: done

## 2022-09-11 NOTE — Patient Instructions (Signed)
Your procedure is scheduled on: Friday 09/14/22 To find out your arrival time, please call [phone] between 1PM - 3PM on:   Thursday 09/13/22 Report to the Registration Desk on the 1st floor of the [facility]. FREE Valet parking is available.  If your arrival time is 6:00 am, do not arrive before that time as the [facility] entrance doors do not open until 6:00 am.  REMEMBER: Instructions that are not followed completely may result in serious medical risk, up to and including death; or upon the discretion of your surgeon and anesthesiologist your surgery may need to be rescheduled.  Do not eat food or drink liquids after midnight the night before surgery.  No gum chewing or hard candies.  One week prior to surgery: Stop Anti-inflammatories (NSAIDS) such as Advil, Aleve, Ibuprofen, Motrin, Naproxen, Naprosyn and Aspirin based products such as Excedrin, Goody's Powder, BC Powder. You may however, continue to take Tylenol if needed for pain up until the day of surgery.  Stop ANY OVER THE COUNTER supplements until after surgery.  Continue taking all prescribed medications.   TAKE ONLY THESE MEDICATIONS THE MORNING OF SURGERY WITH A SIP OF WATER:  pantoprazole (PROTONIX) 20 MG tablet Antacid (take one the night before and one on the morning of surgery - helps to prevent nausea after surgery.  No Alcohol for 24 hours before or after surgery.  No Smoking including e-cigarettes for 24 hours before surgery.  No chewable tobacco products for at least 6 hours before surgery.  No nicotine patches on the day of surgery.  Do not use any "recreational" drugs for at least a week (preferably 2 weeks) before your surgery.  Please be advised that the combination of cocaine and anesthesia may have negative outcomes, up to and including death. If you test positive for cocaine, your surgery will be cancelled.  On the morning of surgery brush your teeth with toothpaste and water, you may rinse  your mouth with mouthwash if you wish. Do not swallow any toothpaste or mouthwash.  Use CHG Soap or wipes as directed on instruction sheet.  Do not wear lotions, powders, or perfumes.   Do not shave body hair from the neck down 48 hours before surgery.  Wear comfortable clothing (specific to your surgery type) to the hospital.  Do not wear jewelry, make-up, hairpins, clips or nail polish.  Contact lenses, hearing aids and dentures may not be worn into surgery.  Do not bring valuables to the hospital. [facility] is not responsible for any missing/lost belongings or valuables.   Notify your doctor if there is any change in your medical condition (cold, fever, infection).  If you are being discharged the day of surgery, you will not be allowed to drive home. You will need a responsible individual to drive you home and stay with you for 24 hours after surgery.   If you are taking public transportation, you will need to have a responsible individual with you.  If you are being admitted to the hospital overnight, leave your suitcase in the car. After surgery it may be brought to your room.  In case of increased patient census, it may be necessary for you, the patient, to continue your postoperative care in the Same Day Surgery department.  After surgery, you can help prevent lung complications by doing breathing exercises.  Take deep breaths and cough every 1-2 hours. Your doctor may order a device called an Incentive Spirometer to help you take deep breaths. When  coughing or sneezing, hold a pillow firmly against your incision with both hands. This is called "splinting." Doing this helps protect your incision. It also decreases belly discomfort.  Surgery Visitation Policy:  Patients undergoing a surgery or procedure may have two family members or support persons with them as long as the person is not COVID-19 positive or experiencing its symptoms.   Inpatient Visitation:    Visiting  hours are 7 a.m. to 8 p.m. Up to four visitors are allowed at one time in a patient room. The visitors may rotate out with other people during the day. One designated support person (adult) may remain overnight.  Please call the Pre-admissions Testing Dept. at [phone] if you have any questions about these instructions.     Preparing for Surgery with CHLORHEXIDINE GLUCONATE (CHG) Soap  Chlorhexidine Gluconate (CHG) Soap  o An antiseptic cleaner that kills germs and bonds with the skin to continue killing germs even after washing  o Used for showering the night before surgery and morning of surgery  Before surgery, you can play an important role by reducing the number of germs on your skin.  CHG (Chlorhexidine gluconate) soap is an antiseptic cleanser which kills germs and bonds with the skin to continue killing germs even after washing.  Please do not use if you have an allergy to CHG or antibacterial soaps. If your skin becomes reddened/irritated stop using the CHG.  1. Shower the NIGHT BEFORE SURGERY and the MORNING OF SURGERY with CHG soap.  2. If you choose to wash your hair, wash your hair first as usual with your normal shampoo.  3. After shampooing, rinse your hair and body thoroughly to remove the shampoo.  4. Use CHG as you would any other liquid soap. You can apply CHG directly to the skin and wash gently with a scrungie or a clean washcloth.  5. Apply the CHG soap to your body only from the neck down. Do not use on open wounds or open sores. Avoid contact with your eyes, ears, mouth, and genitals (private parts). Wash face and genitals (private parts) with your normal soap.  6. Wash thoroughly, paying special attention to the area where your surgery will be performed.  7. Thoroughly rinse your body with warm water.  8. Do not shower/wash with your normal soap after using and rinsing off the CHG soap.  9. Pat yourself dry with a clean towel.  10. Wear clean  pajamas to bed the night before surgery.  12. Place clean sheets on your bed the night of your first shower and do not sleep with pets.  13. Shower again with the CHG soap on the day of surgery prior to arriving at the hospital.  14. Do not apply any deodorants/lotions/powders.  15. Please wear clean clothes to the hospital.

## 2022-09-11 NOTE — Telephone Encounter (Signed)
Patient called to ask about referral to cardio. I looked patient did have referral and it was closed due to patient refusing service. Patient has an established cardio doctor already and is wanting/ requesting s referral to his est cardio doctor per Dr. Winifred Olive orders      Please call and advise

## 2022-09-12 ENCOUNTER — Encounter: Payer: Self-pay | Admitting: Vascular Surgery

## 2022-09-12 NOTE — Progress Notes (Signed)
Perioperative / Anesthesia Services  Pre-Admission Testing Clinical Review / Pre-Operative Anesthesia Consult  Date: 09/13/22  Patient Demographics:  Name: Timothy Reeves. DOB:   1955/03/02 MRN:   [id number]  Planned Surgical Procedure(s):    Case: [id number] Date/Time: 09/14/22 0715   Procedure: REVISION OF ARTERIOVENOUS FISTULA (RADIAL ARTERY REPAIR - [id number]) (Right)   Anesthesia type: General   Pre-op diagnosis: IATROGENIC AV FISTULA   Location: [facility] OR ROOM 08 / [facility]   Surgeons: Renford Dills, MD     NOTE: Available PAT nursing documentation and vital signs have been reviewed. Clinical nursing staff has updated patient's PMH/PSHx, current medication list, and drug allergies/intolerances to ensure comprehensive history available to assist in medical decision making as it pertains to the aforementioned surgical procedure and anticipated anesthetic course. Extensive review of available clinical information personally performed. [place] [facility] and P[facility] updated with any diagnoses/procedures that  may have been inadvertently omitted during his intake with the pre-admission testing department's nursing staff.  Clinical Discussion:  Timothy Reeves. is a 67 y.o. male who is submitted for pre-surgical anesthesia review and clearance prior to him undergoing the above procedure. Patient is a Former Smoker (37 pack years; quit 10/2008). 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  Patient is followed by cardiology Darrold Junker, MD). He was last seen in the cardiology clinic on 07/11/2022; notes reviewed. At the time of his clinic visit, patient doing well overall from a cardiovascular perspective. Patient denied any chest pain,  shortness of breath, PND, orthopnea, palpitations, significant peripheral edema, weakness, fatigue, vertiginous symptoms, or presyncope/syncope. Patient with a past medical history significant for cardiovascular diagnoses. Documented physical exam was grossly benign, providing no evidence of acute exacerbation and/or decompensation of the patient's known cardiovascular conditions.  Diagnostic LEFT heart catheterization was performed on 02/03/2008 revealing coronary artery disease; 50% proximal LAD and 50% D1.  Given the nonobstructive nature of his coronary artery disease, the decision was made to defer intervention opting for medical management.  Repeat diagnostic LEFT heart catheterization performed on 11/30/2010 revealing single-vessel CAD; 40% proximal LAD.  Again, given the nonobstructive nature, no intervention was indicated.  Medical management recommended.  Patient with a history of multiple cerebral aneurysms.    Underwent repair of a 6.5 x 5.7 mm LEFT MCA bifurcation aneurysm on 10/03/2020.  Repair was achieved using a 7 mm web device.  Patient also with a 4.3 x 2.4 mm LEFT ICA/communicating segment aneurysm that was repaired using the placement of 2 overlapping pipeline flow diverters.  Underwent repair of a 3.6 mm RIGHT A2/ACA aneurysm on 05/08/2021.  Repair was achieved via coil embolization.  2.5 mm x 6 cm target Tetra foramen coil placed within the aneurysm pouch.  1.5 mm x 3 cm target Tetra finishing coral was also placed.  Patient with a nonischemic cardiomyopathy diagnosis.  Most recent TTE was performed on 06/18/2022 revealing a mildly reduced left ventricular systolic function with an EF of 40%.  There was mild global hypokinesis.  There was mild biatrial enlargement.  Trivial pulmonary and mild tricuspid/mitral valve regurgitation noted.  All transvalvular gradients were noted to be normal providing no evidence suggestive of valvular stenosis.  RVSP mildly elevated at 43.1 mmHg.   Aorta normal in size with no evidence of aneurysmal dilatation.  Blood pressure reasonably  controlled at 122/78 mmHg on currently prescribed ARB (losartan) and CCB (amlodipine) therapies. Patient is on rosuvastatin for his HLD diagnosis and ASCVD prevention.  Patient has a prediabetes diagnosis; last HgbA1c was 5.5% when checked on 05/18/2022. Patient does not have an OSAH diagnosis.  Patient does not have a formal exercise regimen. Patient is able to complete all of his  ADL/IADLs without cardiovascular limitation. Per the [facility], patient is able to achieve at least 4 METS of physical activity without experiencing any significant degree of angina/anginal equivalent symptoms.  Amlodipine dose was discontinued.  No other changes were made to his medication regimen.  Patient to follow-up with outpatient cardiology in 6 months or sooner if needed.  Timothy Reeves. is scheduled for an REVISION OF ARTERIOVENOUS FISTULA (RADIAL ARTERY REPAIR) (Right) on 09/14/2022 with Dr. Vilinda Flake, MD.  Given patient's past medical history significant for cardiovascular diagnoses, presurgical cardiac clearance was sought by the PAT team. Per cardiology, "this patient is optimized for surgery and may proceed with the planned procedural course with a LOW risk of significant perioperative cardiovascular complications".    In review of his medication reconciliation, it is noted that patient is currently on prescribed daily antithrombotic therapy.  Per standing orders for this procedure from vascular surgery, patient to continue his daily low-dose ASA throughout this perioperative course.  Patient denies previous perioperative complications with anesthesia in the past. In review of the available records, it is noted that patient underwent a general anesthetic course at [facility] (ASA III) in 05/2021 without documented complications.      09/11/2022    2:18 PM 08/27/2022    9:07 AM 10/17/2021    1:35 PM  Vitals  with BMI  Height 6\' 1"     Weight 198 lbs 195 lbs 6 oz   BMI 26.13    Systolic 157 143 161  Diastolic 80 78 76  Pulse 60 60 66    Providers/Specialists:   NOTE: Primary physician provider listed below. Patient may have been seen by APP or partner within same practice.   PROVIDER ROLE / SPECIALTY LAST OV  Schnier, Latina Craver, MD Vascular Surgery (Surgeon) 08/27/2022  Marisue Ivan, MD Primary Care Provider 05/25/2022  Marcina Millard, MD Cardiology 07/11/2022  Gerrie Nordmann, MD Rheumatology 06/27/2022  Vida Rigger, MD Pulmonary Medicine 05/24/2022   Allergies:  Lipitor [atorvastatin]  Current Home Medications:   No current facility-administered medications for this encounter.    acetaminophen (TYLENOL) 500 MG tablet   aspirin EC 81 MG tablet   Budeson-Glycopyrrol-Formoterol (BREZTRI AEROSPHERE) 160-9-4.8 MCG/ACT AERO   Calcium Carb-Cholecalciferol (CALCIUM 600 + D PO)   cetirizine (ZYRTEC) 10 MG tablet   Cyanocobalamin (B-12) 1000 MCG TABS   ipratropium (ATROVENT) 0.06 % nasal spray   IXEKIZUMAB [place]   losartan (COZAAR) 100 MG tablet   pantoprazole (PROTONIX) 20 MG tablet   PARoxetine (PAXIL) 10 MG tablet   rosuvastatin (CRESTOR) 20 MG tablet   triamcinolone (NASACORT) 55 MCG/ACT AERO nasal inhaler   History:   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)    a.) 4.3 x 2.4 mm LEFT ICA/communicating segment aneurysm --> s/p placement of 2 overlapping pipeline flow diverters on 10/03/2020   Aneurysm of middle cerebral artery    a.) 6.5 x 5.7 mm LEFT MCA bifurcation aneurysm --> 7 mm web device placed 10/03/2020   Anterior communicating artery aneurysm    a.) 3.6 mm RIGHT  A2/ACA aneurysm s/p coil embolization 05/08/2021 --> 2.5 mm x 6 cm target Tetra foramen coil (aneurysm pouch) and 1.5 mm x 3 cm target Tetra finishing coil placed   Anxiety    Arthritis    Bilateral carotid artery disease (HCC)  08/27/2022   a.) carotid doppler 08/27/2022: 1-39% BICA   COPD (chronic obstructive pulmonary disease) (HCC)    Coronary artery disease 02/03/2008   a.) LHC 02/03/2008: 50% pLAD, 50% D1 - med mgmt; b.) LHC 11/30/2010: 40% pLAD - med mgmt   Discoid lupus    ED (erectile dysfunction)    GERD (gastroesophageal reflux disease)    Headache    History of 2019 novel coronavirus disease (COVID-19) 07/20/2020   HLD (hyperlipidemia)    Hypertension    Iatrogenic arteriovenous fistula (HCC)    Left bundle branch block (LBBB)    Long term current use of immunosuppressive drug    a.) interleukin-17A blocker (ixekizumab) for psoriasis/psoriatic arthritis Dx   Long-term use of aspirin therapy    NICM (nonischemic cardiomyopathy) (HCC)    a.) MV 11/19/2019: EF 35%; b.) TTE 06/28/2020: EF 35%, mild LVH, mod global HK, mild MR/TR, RVSP 32.8; c.) TTE 06/18/2022: EF 40%, mild global HK, mild BAE, mild MR/TR, triv PR, RVSP 43.1   Pneumonia    Pre-diabetes    Psoriasis    Psoriatic arthritis (HCC)    a.) currently on ixekizumab (Taltz); b.) formerlly Tx'd with MTX (lost efficacy), adalimumab (lost efficacy), entanercept (effective; insurance changed and would not cover)   Past Surgical History:  Procedure Laterality Date   CARDIAC CATHETERIZATION     IR 3D INDEPENDENT WKST  09/22/2020   IR 3D INDEPENDENT WKST  10/04/2020   IR 3D INDEPENDENT WKST  05/08/2021   IR ANGIO INTRA EXTRACRAN SEL COM CAROTID INNOMINATE UNI L MOD SED  04/17/2021   IR ANGIO INTRA EXTRACRAN SEL INTERNAL CAROTID BILAT MOD SED  09/22/2020   IR ANGIO INTRA EXTRACRAN SEL INTERNAL CAROTID BILAT MOD SED  10/17/2021   IR ANGIO INTRA EXTRACRAN SEL INTERNAL CAROTID UNI L MOD SED  10/03/2020   IR ANGIO INTRA EXTRACRAN SEL INTERNAL CAROTID UNI R MOD SED  04/17/2021   IR ANGIO INTRA EXTRACRAN SEL INTERNAL CAROTID UNI R MOD SED  05/11/2021   IR ANGIO VERTEBRAL SEL SUBCLAVIAN INNOMINATE UNI L MOD SED  09/22/2020   IR ANGIO VERTEBRAL SEL SUBCLAVIAN  INNOMINATE UNI L MOD SED  10/17/2021   IR ANGIO VERTEBRAL SEL VERTEBRAL UNI L MOD SED  04/17/2021   IR ANGIO VERTEBRAL SEL VERTEBRAL UNI R MOD SED  09/22/2020   IR ANGIO VERTEBRAL SEL VERTEBRAL UNI R MOD SED  10/17/2021   IR ANGIOGRAM FOLLOW UP STUDY  10/04/2020   IR ANGIOGRAM FOLLOW UP STUDY  10/03/2020   IR ANGIOGRAM FOLLOW UP STUDY  05/08/2021   IR ANGIOGRAM FOLLOW UP STUDY  05/08/2021   IR CT HEAD LTD  10/03/2020   IR CT HEAD LTD  05/08/2021   IR NEURO EACH ADD'L AFTER BASIC UNI LEFT (MS)  10/05/2020   IR NEURO EACH ADD'L AFTER BASIC UNI RIGHT (MS)  05/11/2021   IR RADIOLOGIST EVAL & MGMT  05/05/2021   IR TRANSCATH/EMBOLIZ  10/03/2020   IR TRANSCATH/EMBOLIZ  05/08/2021   IR US GUIDE VASC ACCESS RIGHT  09/22/2020   IR US GUIDE VASC ACCESS RIGHT  10/03/2020   IR US GUIDE VASC ACCESS RIGHT  04/17/2021   IR US GUIDE VASC ACCESS RIGHT  05/08/2021   IR US GUIDE  VASC ACCESS RIGHT  10/17/2021   RADIOLOGY WITH ANESTHESIA N/A 10/03/2020   Procedure: EMBOLIZATION WITH ANESTHESIA;  Surgeon: Baldemar Lenis, MD;  Location: [facility] OR;  Service: Radiology;  Laterality: N/A;   RADIOLOGY WITH ANESTHESIA N/A 05/08/2021   Procedure: EMBOLIZATION;  Surgeon: Baldemar Lenis, MD;  Location: [facility] OR;  Service: Radiology;  Laterality: N/A;   No family history on file. Social History   Tobacco Use   Smoking status: Former    Current packs/day: 0.00    Average packs/day: 1 pack/day for 37.0 years (37.0 ttl pk-yrs)    Types: Cigarettes    Start date: 10/05/1971    Quit date: 10/04/2008    Years since quitting: 13.9   Smokeless tobacco: Never  Vaping Use   Vaping status: Never Used  Substance Use Topics   Alcohol use: Yes    Alcohol/week: 2.0 standard drinks of alcohol    Types: 2 Cans of beer per week   Drug use: Not Currently    Pertinent Clinical Results:  LABS:   Hospital Outpatient Visit on 09/11/2022  Component Date Value Ref Range Status   ABO/RH(D) 09/11/2022 O POS   Final   Antibody  Screen 09/11/2022 NEG   Final   Sample Expiration 09/11/2022 09/25/2022,2359   Final   Extend sample reason 09/11/2022    Final                   Value:NO TRANSFUSIONS OR PREGNANCY IN THE PAST 3 MONTHS Performed at [facility], [address] [place], [place] [address]    WBC 09/11/2022 4.9  4.0 - 10.5 K/uL Final   RBC 09/11/2022 4.43  4.22 - 5.81 MIL/uL Final   Hemoglobin 09/11/2022 13.4  13.0 - 17.0 g/dL Final   HCT 60/45/4098 39.8  39.0 - 52.0 % Final   MCV 09/11/2022 89.8  80.0 - 100.0 fL Final   MCH 09/11/2022 30.2  26.0 - 34.0 pg Final   MCHC 09/11/2022 33.7  30.0 - 36.0 g/dL Final   RDW 11/91/4782 12.8  11.5 - 15.5 % Final   Platelets 09/11/2022 236  150 - 400 K/uL Final   nRBC 09/11/2022 0.0  0.0 - 0.2 % Final   Neutrophils Relative % 09/11/2022 63  % Final   Neutro Abs 09/11/2022 3.1  1.7 - 7.7 K/uL Final   Lymphocytes Relative 09/11/2022 22  % Final   Lymphs Abs 09/11/2022 1.1  0.7 - 4.0 K/uL Final   Monocytes Relative 09/11/2022 10  % Final   Monocytes Absolute 09/11/2022 0.5  0.1 - 1.0 K/uL Final   Eosinophils Relative 09/11/2022 4  % Final   Eosinophils Absolute 09/11/2022 0.2  0.0 - 0.5 K/uL Final   Basophils Relative 09/11/2022 1  % Final   Basophils Absolute 09/11/2022 0.0  0.0 - 0.1 K/uL Final   Immature Granulocytes 09/11/2022 0  % Final   Abs Immature Granulocytes 09/11/2022 0.01  0.00 - 0.07 K/uL Final   Performed at [facility], [address] [place], [place] [address]   Sodium 09/11/2022 138  135 - 145 mmol/L Final   Potassium 09/11/2022 4.3  3.5 - 5.1 mmol/L Final   Chloride 09/11/2022 111  98 - 111 mmol/L Final   CO2 09/11/2022 21 (L)  22 - 32 mmol/L Final   Glucose, Bld 09/11/2022 105 (H)  70 - 99 mg/dL Final   Glucose reference range applies only to samples taken after fasting for at least 8 hours.  BUN 09/11/2022 21  8 - 23 mg/dL Final   Creatinine, Ser 09/11/2022 1.13  0.61 - 1.24 mg/dL Final   Calcium 29/56/2130 8.9  8.9 -  10.3 mg/dL Final   GFR, Estimated 09/11/2022 >60  >60 mL/min Final   Comment: (NOTE) Calculated using the CKD-EPI Creatinine Equation (2021)    Anion gap 09/11/2022 6  5 - 15 Final   Performed at [facility], [address] [place], [place] [address]    ECG: Date: 09/11/2022 Time ECG obtained: 1544 PM Rate: 56 bpm Rhythm:  Sinus bradycardia; LBBB Intervals: PR 176 ms. QRS 156 ms. QTc 457 ms. ST segment and T wave changes: No evidence of acute ST segment elevation or depression.  Lateral T wave abnormalities (chronic). Comparison: Similar to previous tracing obtained on 06/16/2020   IMAGING / PROCEDURES: TRANSTHORACIC ECHOCARDIOGRAM performed on 06/18/2022 Mildly reduced left ventricular systolic function with an EF of 40%.   Mild global hypokinesis. Mild biatrial enlargement Right ventricular size and function normal. Trivial PR Mild MR and TR RVSP = 43.1 mmHg  PULMONARY FUNCTION TESTING performed on 05/24/2022 SPIROMETRY: FVC was 4.77 liters, 104% of predicted FEV1 was 2.97, 85% of predicted FEV1 ratio was 62.28 FEF 25-75% liters per second was 44% of predicted LUNG VOLUMES: TLC was 128% of predicted RV was 188% of predicted DIFFUSION CAPACITY: DLCO was 65% of predicted DLCO/VA was 69% of predicted   MR ANGIO HEAD WO CONTRAST performed on 03/21/2022 No evidence of residual or recurrent aneurysm in the region of the treated left MCA aneurysm.  Limited detail because of susceptibility artifact related to embolization coils.  MR BRAIN/IAC W WO CONTRAST performed on 03/21/2022 No acute or reversible finding.  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 Signal loss related to coiled aneurysms in the left supraclinoid ICA and left MCA bifurcation region.  MYOCARDIAL PERFUSION IMAGING STUDY (LEXISCAN) performed on 11/19/2019 Moderately reduced left ventricular systolic  function with an EF of 35% LBBB present Small fixed perfusion abnormality of mild intensity present in the anteroseptal region on stress images No evidence of reversible ischemia Somewhat equivocal study due to bundle branch block  Impression and Plan:  Timothy Reeves. has been referred for pre-anesthesia review and clearance prior to him undergoing the planned anesthetic and procedural courses. Available labs, pertinent testing, and imaging results were personally reviewed by me in preparation for upcoming operative/procedural course. [facility] medical record has been updated following extensive record review and patient interview with PAT staff.  This patient has been appropriately cleared by cardiology with an overall LOW risk of experiencing significant perioperative cardiovascular complications. Based on clinical review performed today (09/13/22), barring any significant acute changes in the patient's overall condition, it is anticipated that he will be able to proceed with the planned surgical intervention. Any acute changes in clinical condition may necessitate his procedure being postponed and/or cancelled. Patient will meet with anesthesia team (MD and/or CRNA) on the day of his procedure for preoperative evaluation/assessment. Questions regarding anesthetic course will be fielded at that time.   Pre-surgical instructions were reviewed with the patient during his PAT appointment, and questions were fielded to satisfaction by PAT clinical staff. He has been instructed on which medications that he will need to hold prior to surgery, as well as the ones that have been deemed safe/appropriate to take on the day of his procedure. As part of the general education provided by PAT,  patient made aware both verbally and in writing, that he would need to abstain from the use of any illegal substances during his perioperative course.  He was advised that failure to follow the provided instructions  could necessitate case cancellation or result in serious perioperative complications up to and including death. Patient encouraged to contact PAT and/or his surgeon's office to discuss any questions or concerns that may arise prior to surgery; verbalized understanding.   Quentin Mulling, MSN, APRN, FNP-C, CEN [facility]  Perioperative Services Nurse Practitioner Phone: [phone] Fax: [phone] 09/13/22 4:21 PM  NOTE: This note has been prepared using Dragon dictation software. Despite my best ability to proofread, there is always the potential that unintentional transcriptional errors may still occur from this process.

## 2022-09-13 MED ORDER — CHLORHEXIDINE GLUCONATE CLOTH 2 % EX PADS
6.0000 | MEDICATED_PAD | Freq: Once | CUTANEOUS | Status: AC
  Administered 2022-09-14: 6 via TOPICAL

## 2022-09-13 MED ORDER — CHLORHEXIDINE GLUCONATE CLOTH 2 % EX PADS
6.0000 | MEDICATED_PAD | Freq: Once | CUTANEOUS | Status: AC
  Administered 2022-09-13: 6 via TOPICAL

## 2022-09-13 MED ORDER — ORAL CARE MOUTH RINSE: 15.0000 mL | Freq: Once | OROMUCOSAL | Status: AC

## 2022-09-13 MED ORDER — LACTATED RINGERS IV SOLN: INTRAVENOUS | Status: DC

## 2022-09-13 MED ORDER — CEFAZOLIN SODIUM-DEXTROSE 2-4 GM/100ML-% IV SOLN
2.0000 g | INTRAVENOUS | Status: AC
  Administered 2022-09-14: 2 g via INTRAVENOUS

## 2022-09-13 MED ORDER — CHLORHEXIDINE GLUCONATE 0.12 % MT SOLN
15.0000 mL | Freq: Once | OROMUCOSAL | Status: AC
  Administered 2022-09-14: 15 mL via OROMUCOSAL

## 2022-09-14 ENCOUNTER — Other Ambulatory Visit: Payer: Self-pay

## 2022-09-14 ENCOUNTER — Inpatient Hospital Stay: Payer: Medicare PPO | Admitting: Urgent Care

## 2022-09-14 ENCOUNTER — Encounter
Admission: RE | Disposition: A | Discharge status: Home / Self Care | Payer: Self-pay | Source: Home / Self Care | Attending: Vascular Surgery

## 2022-09-14 ENCOUNTER — Telehealth (INDEPENDENT_AMBULATORY_CARE_PROVIDER_SITE_OTHER): Payer: Self-pay

## 2022-09-14 ENCOUNTER — Encounter: Payer: Self-pay | Admitting: Vascular Surgery

## 2022-09-14 ENCOUNTER — Ambulatory Visit
Admission: RE | Admit: 2022-09-14 | Discharge: 2022-09-14 | Disposition: A | Discharge status: Home / Self Care | Payer: Medicare PPO | Attending: Vascular Surgery | Admitting: Vascular Surgery

## 2022-09-14 DIAGNOSIS — Z9889 Other specified postprocedural states: Secondary | ICD-10-CM | POA: Diagnosis not present

## 2022-09-14 DIAGNOSIS — I721 Aneurysm of artery of upper extremity: Secondary | ICD-10-CM | POA: Insufficient documentation

## 2022-09-14 HISTORY — PX: REVISON OF ARTERIOVENOUS FISTULA: SHX6074

## 2022-09-14 HISTORY — DX: Cerebral aneurysm, nonruptured: I67.1

## 2022-09-14 LAB — ABO/RH: ABO/RH(D): O POS

## 2022-09-14 SURGERY — REVISON OF ARTERIOVENOUS FISTULA
Anesthesia: General | Laterality: Right

## 2022-09-14 MED ORDER — ONDANSETRON HCL 4 MG/2ML IJ SOLN
INTRAMUSCULAR | Status: AC
  Filled 2022-09-14: qty 2

## 2022-09-14 MED ORDER — CHLORHEXIDINE GLUCONATE 0.12 % MT SOLN
OROMUCOSAL | Status: AC
  Filled 2022-09-14: qty 15

## 2022-09-14 MED ORDER — BUPIVACAINE HCL (PF) 0.5 % IJ SOLN
INTRAMUSCULAR | Status: AC
  Filled 2022-09-14: qty 30

## 2022-09-14 MED ORDER — BUPIVACAINE LIPOSOME 1.3 % IJ SUSP
INTRAMUSCULAR | Status: AC
  Filled 2022-09-14: qty 20

## 2022-09-14 MED ORDER — DEXAMETHASONE SODIUM PHOSPHATE 10 MG/ML IJ SOLN
INTRAMUSCULAR | Status: DC | PRN
  Administered 2022-09-14: 10 mg via INTRAVENOUS

## 2022-09-14 MED ORDER — LIDOCAINE HCL (CARDIAC) PF 100 MG/5ML IV SOSY
PREFILLED_SYRINGE | INTRAVENOUS | Status: DC | PRN
  Administered 2022-09-14: 60 mg via INTRAVENOUS

## 2022-09-14 MED ORDER — PROPOFOL 10 MG/ML IV BOLUS
INTRAVENOUS | Status: DC | PRN
  Administered 2022-09-14: 20 mg via INTRAVENOUS
  Administered 2022-09-14: 30 mg via INTRAVENOUS
  Administered 2022-09-14: 120 mg via INTRAVENOUS

## 2022-09-14 MED ORDER — PROPOFOL 10 MG/ML IV BOLUS
INTRAVENOUS | Status: AC
  Filled 2022-09-14: qty 20

## 2022-09-14 MED ORDER — FENTANYL CITRATE (PF) 100 MCG/2ML IJ SOLN
INTRAMUSCULAR | Status: DC | PRN
  Administered 2022-09-14 (×4): 25 ug via INTRAVENOUS

## 2022-09-14 MED ORDER — HEMOSTATIC AGENTS (NO CHARGE) OPTIME
TOPICAL | Status: DC | PRN
  Administered 2022-09-14: 1 via TOPICAL

## 2022-09-14 MED ORDER — HEPARIN SODIUM (PORCINE) 5000 UNIT/ML IJ SOLN
INTRAMUSCULAR | Status: AC
  Filled 2022-09-14: qty 1

## 2022-09-14 MED ORDER — EPHEDRINE 5 MG/ML INJ
INTRAVENOUS | Status: AC
  Filled 2022-09-14: qty 5

## 2022-09-14 MED ORDER — HYDROCODONE-ACETAMINOPHEN 5-325 MG PO TABS: 1.0000 | ORAL_TABLET | Freq: Four times a day (QID) | ORAL | 0 refills | Status: DC | PRN

## 2022-09-14 MED ORDER — MIDAZOLAM HCL 2 MG/2ML IJ SOLN
INTRAMUSCULAR | Status: AC
  Filled 2022-09-14: qty 2

## 2022-09-14 MED ORDER — FENTANYL CITRATE (PF) 100 MCG/2ML IJ SOLN
INTRAMUSCULAR | Status: AC
  Filled 2022-09-14: qty 2

## 2022-09-14 MED ORDER — MIDAZOLAM HCL 2 MG/2ML IJ SOLN
INTRAMUSCULAR | Status: DC | PRN
  Administered 2022-09-14: 1 mg via INTRAVENOUS

## 2022-09-14 MED ORDER — CEFAZOLIN SODIUM-DEXTROSE 2-4 GM/100ML-% IV SOLN
INTRAVENOUS | Status: AC
  Filled 2022-09-14: qty 100

## 2022-09-14 MED ORDER — SODIUM CHLORIDE 0.9 % IV SOLN
INTRAVENOUS | Status: DC | PRN
  Administered 2022-09-14: 250 mL

## 2022-09-14 MED ORDER — GLYCOPYRROLATE 0.2 MG/ML IJ SOLN
INTRAMUSCULAR | Status: DC | PRN
  Administered 2022-09-14: .2 mg via INTRAVENOUS

## 2022-09-14 MED ORDER — LIDOCAINE HCL (PF) 2 % IJ SOLN
INTRAMUSCULAR | Status: AC
  Filled 2022-09-14: qty 5

## 2022-09-14 MED ORDER — BUPIVACAINE LIPOSOME 1.3 % IJ SUSP
INTRAMUSCULAR | Status: DC | PRN
  Administered 2022-09-14: 11 mL

## 2022-09-14 MED ORDER — ONDANSETRON HCL 4 MG/2ML IJ SOLN
INTRAMUSCULAR | Status: DC | PRN
  Administered 2022-09-14: 4 mg via INTRAVENOUS

## 2022-09-14 MED ORDER — EPHEDRINE SULFATE (PRESSORS) 50 MG/ML IJ SOLN
INTRAMUSCULAR | Status: DC | PRN
  Administered 2022-09-14: 10 mg via INTRAVENOUS
  Administered 2022-09-14: 5 mg via INTRAVENOUS
  Administered 2022-09-14 (×2): 10 mg via INTRAVENOUS

## 2022-09-14 SURGICAL SUPPLY — 47 items
ADH SKN CLS APL DERMABOND .7 (GAUZE/BANDAGES/DRESSINGS) ×1
BAG DECANTER FOR FLEXI CONT (MISCELLANEOUS) ×1 IMPLANT
BLADE SURG 15 STRL LF DISP TIS (BLADE) ×1 IMPLANT
BLADE SURG 15 STRL SS (BLADE) ×1
BLADE SURG SZ11 CARB STEEL (BLADE) ×1 IMPLANT
BOOT SUTURE VASCULAR YLW (MISCELLANEOUS) ×1
BRUSH SCRUB EZ 4% CHG (MISCELLANEOUS) ×1 IMPLANT
CLAMP SUTURE YELLOW 5 PAIRS (MISCELLANEOUS) ×1 IMPLANT
DERMABOND ADVANCED .7 DNX12 (GAUZE/BANDAGES/DRESSINGS) ×1 IMPLANT
DRESSING SURGICEL FIBRLLR 1X2 (HEMOSTASIS) ×1 IMPLANT
DRSG SURGICEL FIBRILLAR 1X2 (HEMOSTASIS) ×1
ELECT REM PT RETURN 9FT ADLT (ELECTROSURGICAL) ×1
ELECTRODE REM PT RTRN 9FT ADLT (ELECTROSURGICAL) ×1 IMPLANT
GEL ULTRASOUND 20GR AQUASONIC (MISCELLANEOUS) ×1 IMPLANT
GLOVE SURG SYN 8.0 (GLOVE) ×1 IMPLANT
GLOVE SURG SYN 8.0 PF PI (GLOVE) ×1 IMPLANT
GOWN STRL REUS W/ TWL LRG LVL3 (GOWN DISPOSABLE) ×3 IMPLANT
GOWN STRL REUS W/ TWL XL LVL3 (GOWN DISPOSABLE) ×1 IMPLANT
GOWN STRL REUS W/TWL LRG LVL3 (GOWN DISPOSABLE) ×1
GOWN STRL REUS W/TWL XL LVL3 (GOWN DISPOSABLE) ×1
IV NS 500ML (IV SOLUTION) ×1
IV NS 500ML BAXH (IV SOLUTION) ×1 IMPLANT
KIT TURNOVER KIT A (KITS) ×1 IMPLANT
LOOP VESSEL MAXI 1X406 RED (MISCELLANEOUS) ×1 IMPLANT
LOOP VESSEL MINI 0.8X406 BLUE (MISCELLANEOUS) ×2 IMPLANT
MANIFOLD NEPTUNE II (INSTRUMENTS) ×1 IMPLANT
NDL FILTER BLUNT 18X1 1/2 (NEEDLE) ×1 IMPLANT
NEEDLE FILTER BLUNT 18X1 1/2 (NEEDLE) ×1 IMPLANT
PACK EXTREMITY ARMC (MISCELLANEOUS) ×1 IMPLANT
PAD PREP OB/GYN DISP 24X41 (PERSONAL CARE ITEMS) ×1 IMPLANT
STOCKINETTE 48X4 2 PLY STRL (GAUZE/BANDAGES/DRESSINGS) ×1 IMPLANT
STOCKINETTE STRL 4IN 9604848 (GAUZE/BANDAGES/DRESSINGS) ×1 IMPLANT
SUT MNCRL+ 5-0 UNDYED PC-3 (SUTURE) ×1 IMPLANT
SUT PROLENE 5 0 RB 1 DA (SUTURE) IMPLANT
SUT PROLENE 6 0 BV (SUTURE) ×4 IMPLANT
SUT SILK 2 0 (SUTURE) ×1
SUT SILK 2-0 18XBRD TIE 12 (SUTURE) ×1 IMPLANT
SUT SILK 3 0 (SUTURE) ×1
SUT SILK 3-0 18XBRD TIE 12 (SUTURE) ×1 IMPLANT
SUT SILK 4 0 (SUTURE) ×1
SUT SILK 4-0 18XBRD TIE 12 (SUTURE) ×1 IMPLANT
SUT VIC AB 3-0 SH 27 (SUTURE) ×2
SUT VIC AB 3-0 SH 27X BRD (SUTURE) ×2 IMPLANT
SYR 20ML LL LF (SYRINGE) ×1 IMPLANT
SYR 3ML LL SCALE MARK (SYRINGE) ×1 IMPLANT
TAG SUTURE CLAMP YLW 5PR (MISCELLANEOUS) ×1
TRAP FLUID SMOKE EVACUATOR (MISCELLANEOUS) ×1 IMPLANT

## 2022-09-14 NOTE — Anesthesia Preprocedure Evaluation (Signed)
Anesthesia Evaluation  Patient identified by MRN, date of birth, ID band Patient awake    Reviewed: Allergy & Precautions, H&P , NPO status , Patient's Chart, lab work & pertinent test results, reviewed documented beta blocker date and time   Airway Mallampati: II   Neck ROM: full    Dental  (+) Poor Dentition   Pulmonary pneumonia, resolved, COPD, former smoker   Pulmonary exam normal        Cardiovascular Exercise Tolerance: Good hypertension, On Medications + CAD  Normal cardiovascular exam+ dysrhythmias  Rhythm:regular Rate:Normal     Neuro/Psych  Headaches PSYCHIATRIC DISORDERS Anxiety        GI/Hepatic Neg liver ROS,GERD  Medicated,,  Endo/Other  negative endocrine ROS    Renal/GU negative Renal ROS  negative genitourinary   Musculoskeletal   Abdominal   Peds  Hematology  (+) Blood dyscrasia, anemia   Anesthesia Other Findings Past Medical History: No date: Anemia No date: 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)     Comment:  a.) 4.3 x 2.4 mm LEFT ICA/communicating segment aneurysm              --> s/p placement of 2 overlapping pipeline flow               diverters on 10/03/2020 No date: Aneurysm of middle cerebral artery     Comment:  a.) 6.5 x 5.7 mm LEFT MCA bifurcation aneurysm --> 7 mm               web device placed 10/03/2020 No date: Anterior communicating artery aneurysm     Comment:  a.) 3.6 mm RIGHT A2/ACA aneurysm s/p coil embolization               05/08/2021 --> 2.5 mm x 6 cm target Tetra foramen coil               (aneurysm pouch) and 1.5 mm x 3 cm target Tetra finishing              coil placed No date: Anxiety No date: Arthritis 08/27/2022: Bilateral carotid artery disease (HCC)     Comment:  a.) carotid doppler 08/27/2022: 1-39% BICA No date: COPD (chronic obstructive pulmonary disease) (HCC) 02/03/2008: Coronary artery disease     Comment:  a.) LHC 02/03/2008:  50% pLAD, 50% D1 - med mgmt; b.) LHC              11/30/2010: 40% pLAD - med mgmt No date: Discoid lupus No date: ED (erectile dysfunction) No date: GERD (gastroesophageal reflux disease) No date: Headache 07/20/2020: History of 2019 novel coronavirus disease (COVID-19) No date: HLD (hyperlipidemia) No date: Hypertension No date: Iatrogenic arteriovenous fistula (HCC) No date: Left bundle branch block (LBBB) No date: Long term current use of immunosuppressive drug     Comment:  a.) interleukin-17A blocker (ixekizumab) for               psoriasis/psoriatic arthritis Dx No date: Long-term use of aspirin therapy No date: NICM (nonischemic cardiomyopathy) (HCC)     Comment:  a.) MV 11/19/2019: EF 35%; b.) TTE 06/28/2020: EF 35%,               mild LVH, mod global HK, mild MR/TR, RVSP 32.8; c.) TTE               06/18/2022: EF 40%, mild global HK, mild BAE, mild MR/TR,  triv PR, RVSP 43.1 No date: Pneumonia No date: Pre-diabetes No date: Psoriasis No date: Psoriatic arthritis (HCC)     Comment:  a.) currently on ixekizumab (Taltz); b.) formerlly Tx'd               with MTX (lost efficacy), adalimumab (lost efficacy),               entanercept (effective; insurance changed and would not               cover) Past Surgical History: No date: CARDIAC CATHETERIZATION 09/22/2020: IR 3D INDEPENDENT WKST 10/04/2020: IR 3D INDEPENDENT WKST 05/08/2021: IR 3D INDEPENDENT WKST 04/17/2021: IR ANGIO INTRA EXTRACRAN SEL COM CAROTID INNOMINATE UNI L  MOD SED 09/22/2020: IR ANGIO INTRA EXTRACRAN SEL INTERNAL CAROTID BILAT MOD SED 10/17/2021: IR ANGIO INTRA EXTRACRAN SEL INTERNAL CAROTID BILAT MOD  SED 10/03/2020: IR ANGIO INTRA EXTRACRAN SEL INTERNAL CAROTID UNI L MOD SED 04/17/2021: IR ANGIO INTRA EXTRACRAN SEL INTERNAL CAROTID UNI R MOD SED 05/11/2021: IR ANGIO INTRA EXTRACRAN SEL INTERNAL CAROTID UNI R MOD SED 09/22/2020: IR ANGIO VERTEBRAL SEL SUBCLAVIAN INNOMINATE UNI L MOD SED 10/17/2021:  IR ANGIO VERTEBRAL SEL SUBCLAVIAN INNOMINATE UNI L MOD SED 04/17/2021: IR ANGIO VERTEBRAL SEL VERTEBRAL UNI L MOD SED 09/22/2020: IR ANGIO VERTEBRAL SEL VERTEBRAL UNI R MOD SED 10/17/2021: IR ANGIO VERTEBRAL SEL VERTEBRAL UNI R MOD SED 10/04/2020: IR ANGIOGRAM FOLLOW UP STUDY 10/03/2020: IR ANGIOGRAM FOLLOW UP STUDY 05/08/2021: IR ANGIOGRAM FOLLOW UP STUDY 05/08/2021: IR ANGIOGRAM FOLLOW UP STUDY 10/03/2020: IR CT HEAD LTD 05/08/2021: IR CT HEAD LTD 10/05/2020: IR NEURO EACH ADD'L AFTER BASIC UNI LEFT (MS) 05/11/2021: IR NEURO EACH ADD'L AFTER BASIC UNI RIGHT (MS) 05/05/2021: IR RADIOLOGIST EVAL & MGMT 10/03/2020: IR TRANSCATH/EMBOLIZ 05/08/2021: IR TRANSCATH/EMBOLIZ 09/22/2020: IR US GUIDE VASC ACCESS RIGHT 10/03/2020: IR US GUIDE VASC ACCESS RIGHT 04/17/2021: IR US GUIDE VASC ACCESS RIGHT 05/08/2021: IR US GUIDE VASC ACCESS RIGHT 10/17/2021: IR US GUIDE VASC ACCESS RIGHT 10/03/2020: RADIOLOGY WITH ANESTHESIA; N/A     Comment:  Procedure: EMBOLIZATION WITH ANESTHESIA;  Surgeon: Baldemar Lenis, MD;  Location: [facility] OR;                Service: Radiology;  Laterality: N/A; 05/08/2021: RADIOLOGY WITH ANESTHESIA; N/A     Comment:  Procedure: EMBOLIZATION;  Surgeon: Baldemar Lenis, MD;  Location: [facility] OR;  Service: Radiology;                Laterality: N/A; BMI    Body Mass Index: 26.12 kg/m     Reproductive/Obstetrics negative OB ROS                             Anesthesia Physical Anesthesia Plan  ASA: 3  Anesthesia Plan: General, MAC and General LMA   Post-op Pain Management:    Induction:   PONV Risk Score and Plan: 3  Airway Management Planned:   Additional Equipment:   Intra-op Plan:   Post-operative Plan:   Informed Consent: I have reviewed the patients History and Physical, chart, labs and discussed the procedure including the risks, benefits and alternatives for the proposed anesthesia with the patient or  authorized representative who has indicated his/her understanding and acceptance.     Dental Advisory Given  Plan  Discussed with: CRNA  Anesthesia Plan Comments:        Anesthesia Quick Evaluation

## 2022-09-14 NOTE — Progress Notes (Signed)
Patient was moving wrist excessively upon arrival to the Post Op area. His site was weeping a minimal amount of blood. Education was provided on the need to not to move wrist excessively. This nurse monitored the wrist during his stay in post op and the weeping ceased. The wife was shown that the wrist is not bleeding and was advised to call Dr. Consuelo Pandy office if it started weeping again. The wife and the husband verbalized understanding.

## 2022-09-14 NOTE — Interval H&P Note (Signed)
History and Physical Interval Note:  09/14/2022 7:22 AM  Timothy Reeves.  has presented today for surgery, with the diagnosis of LATROGENIC AV FISTULA.  The various methods of treatment have been discussed with the patient and family. After consideration of risks, benefits and other options for treatment, the patient has consented to  Procedure(s): REVISON OF ARTERIOVENOUS FISTULA (RADIAL ARTERY REPAIR - [id number]) (Right) as a surgical intervention.  The patient's history has been reviewed, patient examined, no change in status, stable for surgery.  I have reviewed the patient's chart and labs.  Questions were answered to the patient's satisfaction.     Levora Dredge

## 2022-09-14 NOTE — Telephone Encounter (Signed)
Patient called in stating that when he moves his wrist he starts to bleed. I contacted  specials and they called Dr. Gilda Crease and told me that the patient needs to  go to the ED. Patient was informed of this decision and stated he would go to the ED.

## 2022-09-14 NOTE — Discharge Instructions (Signed)

## 2022-09-14 NOTE — Transfer of Care (Signed)
Immediate Anesthesia Transfer of Care Note  Patient: Timothy Reeves.  Procedure(s) Performed: REVISON OF ARTERIOVENOUS FISTULA (RADIAL ARTERY REPAIR - [phone]) (Right)  Patient Location: PACU  Anesthesia Type:General  Level of Consciousness: drowsy  Airway & Oxygen Therapy: Patient Spontanous Breathing and Patient connected to face mask oxygen  Post-op Assessment: Report given to RN and Post -op Vital signs reviewed and stable  Post vital signs: Reviewed and stable  Last Vitals:  Vitals Value Taken Time  BP 160/72 09/14/22 0940  Temp    Pulse 43 09/14/22 0943  Resp 14 09/14/22 0943  SpO2 99 % 09/14/22 0943  Vitals shown include unfiled device data.  Last Pain:  Vitals:   09/14/22 0640  TempSrc: Temporal  PainSc: 0-No pain         Complications: No notable events documented.

## 2022-09-14 NOTE — Op Note (Signed)
    OPERATIVE NOTE   PROCEDURE: Resection of right radial artery pseudoaneurysm with repair of right radial artery  PRE-OPERATIVE DIAGNOSIS: Right radial artery pseudoaneurysm  POST-OPERATIVE DIAGNOSIS: Same  SURGEON: Levora Dredge  ASSISTANT(S): None  ANESTHESIA: general  ESTIMATED BLOOD LOSS: 10 cc  FINDING(S): 2 cm pseudoaneurysm right radial artery  SPECIMEN(S): Pseudoaneurysm sac sent to pathology  INDICATIONS:   Timothy Reeves. is a 67 y.o. 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  This is secondary to a remote cardiac catheterization.  Risks and benefits for resection and repair were reviewed all questions answered patient agrees to proceed..  DESCRIPTION: After full informed written consent was obtained from the patient, the patient was brought back to the operating room and placed supine upon the operating table.  Prior to induction, the patient received IV antibiotics.   After obtaining adequate anesthesia, the patient was then prepped and draped in the standard fashion for a repair of right radial artery with resection of pseudoaneurysm.  Linear incision was created in the skin overlying the pseudoaneurysm and extended slightly more proximally with a 15 blade scalpel.  Dissection is carried down proximally to expose the radial artery which is then looped with a red Silastic vessel loop.  The dissection is then carried distally exposing the pseudoaneurysm.  Associated venous branches surrounding the aneurysm were ligated and divided with combination of 2-0 and 3-0 silk ties as well as 6-0 Prolene as needed.  Ultimately I skeletonized the pseudoaneurysm sac completely and identified the distal outflow of the radial artery which was looped with a red Silastic vessel loop.  At this point I was able to complete the dissection of the radial artery identifying the stalk of the pseudoaneurysm sac.  This was then oversewn with a 5-0 Prolene  suture in a pursestring fashion.  The sac itself was then resected and passed off the field the specimen.  The wound was then irrigated copiously with saline hemostasis was attained with Bovie cautery.  A total of 10 cc of Marcaine and Exparel were then infiltrated into the soft tissues and the skin.  A thin layer fibrillar was placed around the radial artery.  The incision was then closed using running 3-0 Vicryl for the subcutaneous tissues and a 4-0 Monocryl subcuticular followed by Dermabond for skin closure.   The patient tolerated this procedure well.   COMPLICATIONS: None  CONDITION: [facility]  Office: [phone]   09/14/2022, 9:42 AM

## 2022-09-14 NOTE — Anesthesia Procedure Notes (Signed)
Procedure Name: LMA Insertion Date/Time: 09/14/2022 7:47 AM  Performed by: Hezzie Bump, CRNAPre-anesthesia Checklist: Patient identified, Patient being monitored, Timeout performed, Emergency Drugs available and Suction available Patient Re-evaluated:Patient Re-evaluated prior to induction Oxygen Delivery Method: Circle system utilized Preoxygenation: Pre-oxygenation with 100% oxygen Induction Type: IV induction Ventilation: Mask ventilation without difficulty LMA: LMA inserted LMA Size: 4.0 Tube type: Oral Number of attempts: 1 Placement Confirmation: positive ETCO2 and breath sounds checked- equal and bilateral Tube secured with: Tape Dental Injury: Teeth and Oropharynx as per pre-operative assessment

## 2022-09-14 NOTE — Anesthesia Postprocedure Evaluation (Signed)
Anesthesia Post Note  Patient: Timothy Reeves.  Procedure(s) Performed: REVISON OF ARTERIOVENOUS FISTULA (RADIAL ARTERY REPAIR - [phone]) (Right)  Patient location during evaluation: PACU Anesthesia Type: General Level of consciousness: awake and alert Pain management: pain level controlled Vital Signs Assessment: post-procedure vital signs reviewed and stable Respiratory status: spontaneous breathing, nonlabored ventilation, respiratory function stable and patient connected to nasal cannula oxygen Cardiovascular status: blood pressure returned to baseline and stable Postop Assessment: no apparent nausea or vomiting Anesthetic complications: no   No notable events documented.   Last Vitals:  Vitals:   09/14/22 1000 09/14/22 1002  BP: (!) 188/75 (!) 176/72  Pulse: (!) 50 (!) 49  Resp: (!) 22 15  Temp:    SpO2: 98% 97%    Last Pain:  Vitals:   09/14/22 1002  TempSrc:   PainSc: 0-No pain                 Louie Boston

## 2022-09-15 ENCOUNTER — Encounter: Payer: Self-pay | Admitting: Vascular Surgery

## 2022-09-17 LAB — SURGICAL PATHOLOGY

## 2022-09-18 ENCOUNTER — Telehealth (INDEPENDENT_AMBULATORY_CARE_PROVIDER_SITE_OTHER): Payer: Self-pay

## 2022-09-18 NOTE — Telephone Encounter (Signed)
Called pt and made him aware of Fallon's advice and he states understanding

## 2022-09-18 NOTE — Telephone Encounter (Signed)
If he is using the hand that he had surgery on, no he can't do those things.  Those also have vibrations and require some vigorous movements that may threaten the area that is still healing and cause it to open, especially this close to his surgery date.  In fact because of where it is, he probably needs to avoid activity like that for about a month

## 2022-10-07 DIAGNOSIS — I721 Aneurysm of artery of upper extremity: Secondary | ICD-10-CM | POA: Insufficient documentation

## 2022-10-07 NOTE — Progress Notes (Unsigned)
Patient ID: Timothy Reeves., male   DOB: [birth year], 67 y.o.   MRN: [id number]    HPI Timothy Reeves. is a 67 y.o. male.    PROCEDURE 09/14/2022: Resection of right radial artery pseudoaneurysm with repair of right radial artery   The patient denies pain at the right wrist/surgical site.  He did not have any further issues or concerns after the day of surgery.  He is asking to return to full activity.  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)    a.) 4.3 x 2.4 mm LEFT ICA/communicating segment aneurysm --> s/p placement of 2 overlapping pipeline flow diverters on 10/03/2020   Aneurysm of middle cerebral artery    a.) 6.5 x 5.7 mm LEFT MCA bifurcation aneurysm --> 7 mm web device placed 10/03/2020   Anterior communicating artery aneurysm    a.) 3.6 mm RIGHT A2/ACA aneurysm s/p coil embolization 05/08/2021 --> 2.5 mm x 6 cm target Tetra foramen coil (aneurysm pouch) and 1.5 mm x 3 cm target Tetra finishing coil placed   Anxiety    Arthritis    Bilateral carotid artery disease (HCC) 08/27/2022   a.) carotid doppler 08/27/2022: 1-39% BICA   COPD (chronic obstructive pulmonary disease) (HCC)    Coronary artery disease 02/03/2008   a.) LHC 02/03/2008: 50% pLAD, 50% D1 - med mgmt; b.) LHC 11/30/2010: 40% pLAD - med mgmt   Discoid lupus    ED (erectile dysfunction)    GERD (gastroesophageal reflux disease)    Headache    History of 2019 novel coronavirus disease (COVID-19) 07/20/2020   HLD (hyperlipidemia)    Hypertension    Iatrogenic arteriovenous fistula (HCC)    Left bundle branch block (LBBB)    Long term current use of immunosuppressive drug    a.) interleukin-17A blocker (ixekizumab) for psoriasis/psoriatic arthritis Dx   Long-term use of aspirin therapy    NICM (nonischemic cardiomyopathy) (HCC)    a.) MV 11/19/2019: EF 35%; b.) TTE 06/28/2020: EF 35%, mild LVH, mod global HK, mild MR/TR, RVSP  32.8; c.) TTE 06/18/2022: EF 40%, mild global HK, mild BAE, mild MR/TR, triv PR, RVSP 43.1   Pneumonia    Pre-diabetes    Psoriasis    Psoriatic arthritis (HCC)    a.) currently on ixekizumab (Taltz); b.) formerlly Tx'd with MTX (lost efficacy), adalimumab (lost efficacy), entanercept (effective; insurance changed and would not cover)    Past Surgical History:  Procedure Laterality Date   CARDIAC CATHETERIZATION     IR 3D INDEPENDENT WKST  09/22/2020   IR 3D INDEPENDENT WKST  10/04/2020   IR 3D INDEPENDENT WKST  05/08/2021   IR ANGIO INTRA EXTRACRAN SEL COM CAROTID INNOMINATE UNI L MOD SED  04/17/2021   IR ANGIO INTRA EXTRACRAN SEL INTERNAL CAROTID BILAT MOD SED  09/22/2020   IR ANGIO INTRA EXTRACRAN SEL INTERNAL CAROTID BILAT MOD SED  10/17/2021   IR ANGIO INTRA EXTRACRAN SEL INTERNAL CAROTID UNI L MOD SED  10/03/2020   IR ANGIO INTRA EXTRACRAN SEL INTERNAL CAROTID UNI R MOD SED  04/17/2021   IR ANGIO INTRA EXTRACRAN SEL INTERNAL CAROTID UNI R MOD SED  05/11/2021   IR ANGIO VERTEBRAL SEL SUBCLAVIAN INNOMINATE UNI L MOD SED  09/22/2020   IR ANGIO VERTEBRAL SEL SUBCLAVIAN INNOMINATE UNI L MOD SED  10/17/2021   IR ANGIO VERTEBRAL SEL VERTEBRAL UNI L MOD SED  04/17/2021   IR  ANGIO VERTEBRAL SEL VERTEBRAL UNI R MOD SED  09/22/2020   IR ANGIO VERTEBRAL SEL VERTEBRAL UNI R MOD SED  10/17/2021   IR ANGIOGRAM FOLLOW UP STUDY  10/04/2020   IR ANGIOGRAM FOLLOW UP STUDY  10/03/2020   IR ANGIOGRAM FOLLOW UP STUDY  05/08/2021   IR ANGIOGRAM FOLLOW UP STUDY  05/08/2021   IR CT HEAD LTD  10/03/2020   IR CT HEAD LTD  05/08/2021   IR NEURO EACH ADD'L AFTER BASIC UNI LEFT (MS)  10/05/2020   IR NEURO EACH ADD'L AFTER BASIC UNI RIGHT (MS)  05/11/2021   IR RADIOLOGIST EVAL & MGMT  05/05/2021   IR TRANSCATH/EMBOLIZ  10/03/2020   IR TRANSCATH/EMBOLIZ  05/08/2021   IR US GUIDE VASC ACCESS RIGHT  09/22/2020   IR US GUIDE VASC ACCESS RIGHT  10/03/2020   IR US GUIDE VASC ACCESS RIGHT  04/17/2021   IR US GUIDE VASC ACCESS RIGHT   05/08/2021   IR US GUIDE VASC ACCESS RIGHT  10/17/2021   RADIOLOGY WITH ANESTHESIA N/A 10/03/2020   Procedure: EMBOLIZATION WITH ANESTHESIA;  Surgeon: Baldemar Lenis, MD;  Location: [facility] OR;  Service: Radiology;  Laterality: N/A;   RADIOLOGY WITH ANESTHESIA N/A 05/08/2021   Procedure: EMBOLIZATION;  Surgeon: Baldemar Lenis, MD;  Location: [facility] OR;  Service: Radiology;  Laterality: N/A;   REVISON OF ARTERIOVENOUS FISTULA Right 09/14/2022   Procedure: REVISON OF ARTERIOVENOUS FISTULA (RADIAL ARTERY REPAIR - [id number]);  Surgeon: Renford Dills, MD;  Location: [facility] ORS;  Service: Vascular;  Laterality: Right;      Allergies  Allergen Reactions   Lipitor [Atorvastatin] Other (See Comments)    Muscle aches    Current Outpatient Medications  Medication Sig Dispense Refill   acetaminophen (TYLENOL) 500 MG tablet Take 1,000 mg by mouth every 6 (six) hours as needed for moderate pain.     aspirin EC 81 MG tablet Take 81 mg by mouth daily. Swallow whole.     Budeson-Glycopyrrol-Formoterol (BREZTRI AEROSPHERE) 160-9-4.8 MCG/ACT AERO Inhale 1 puff into the lungs 2 (two) times daily.     Calcium Carb-Cholecalciferol (CALCIUM 600 + D PO) Take 1 tablet by mouth 2 (two) times daily.     cetirizine (ZYRTEC) 10 MG tablet Take 10 mg by mouth daily as needed.     Cyanocobalamin (B-12) 1000 MCG TABS Take 1,000 mcg by mouth at bedtime.     HYDROcodone-acetaminophen (NORCO) 5-325 MG tablet Take 1 tablet by mouth every 6 (six) hours as needed for moderate pain. 30 tablet 0   ipratropium (ATROVENT) 0.06 % nasal spray Place 2 sprays into both nostrils daily as needed for rhinitis.     IXEKIZUMAB Clarence Inject into the skin every 6 (six) weeks.     losartan (COZAAR) 100 MG tablet Take 100 mg by mouth at bedtime.     pantoprazole (PROTONIX) 20 MG tablet Take 20 mg by mouth daily with supper.     PARoxetine (PAXIL) 10 MG tablet Take 10 mg by mouth at bedtime.     rosuvastatin (CRESTOR) 20 MG  tablet Take 20 mg by mouth daily with supper.     triamcinolone (NASACORT) 55 MCG/ACT AERO nasal inhaler Place 2 sprays into the nose daily as needed (allergies).     No current facility-administered medications for this visit.        Physical Exam There were no vitals taken for this visit. Gen:  WD/WN, NAD Skin: incision C/D/I, small healing ridge noted with but no aneurysmal  dilatation.  No pulsatility noted.     Assessment/Plan: 1. Radial artery aneurysm, right [facility]) Patient is status post successful radial artery pseudoaneurysm repair.  He can now return to full activities.  He will follow-up in 3 months with a duplex ultrasound. - VAS [place] UPPER EXTREMITY ARTERIAL DUPLEX; Future      Levora Dredge 10/07/2022, 3:06 PM   This note was created with Dragon medical transcription system.  Any errors from dictation are unintentional.

## 2022-10-08 ENCOUNTER — Encounter (INDEPENDENT_AMBULATORY_CARE_PROVIDER_SITE_OTHER): Payer: Self-pay | Admitting: Vascular Surgery

## 2022-10-08 ENCOUNTER — Ambulatory Visit (INDEPENDENT_AMBULATORY_CARE_PROVIDER_SITE_OTHER): Payer: Medicare PPO | Admitting: Vascular Surgery

## 2022-10-08 VITALS — BP 138/74 | HR 69 | Resp 16 | Wt 197.4 lb

## 2022-10-08 DIAGNOSIS — I721 Aneurysm of artery of upper extremity: Secondary | ICD-10-CM

## 2022-10-11 ENCOUNTER — Encounter (INDEPENDENT_AMBULATORY_CARE_PROVIDER_SITE_OTHER): Payer: Self-pay | Admitting: Vascular Surgery

## 2022-10-23 ENCOUNTER — Other Ambulatory Visit (HOSPITAL_COMMUNITY): Payer: Self-pay | Admitting: Neuroradiology

## 2022-10-23 DIAGNOSIS — I671 Cerebral aneurysm, nonruptured: Secondary | ICD-10-CM

## 2022-10-30 ENCOUNTER — Telehealth (INDEPENDENT_AMBULATORY_CARE_PROVIDER_SITE_OTHER): Payer: Self-pay

## 2022-10-30 NOTE — Telephone Encounter (Signed)
He can come in and see GS to evaluate to see if it is indeed larger

## 2022-10-30 NOTE — Telephone Encounter (Addendum)
Patient stated he had surgery a little over a month again because "something was bulging and its bulging again." He just noticed it yesterday. He stated he doesn't have any pain at all, but his wife thinks it looks bigger.   09/14/22 radial artery pseudoaneurysm repair   Please advise

## 2022-10-31 ENCOUNTER — Other Ambulatory Visit: Payer: Self-pay | Admitting: Pulmonary Disease

## 2022-10-31 ENCOUNTER — Ambulatory Visit (HOSPITAL_COMMUNITY)
Admission: RE | Admit: 2022-10-31 | Discharge: 2022-10-31 | Disposition: A | Discharge status: Home / Self Care | Payer: Medicare PPO | Source: Ambulatory Visit | Attending: Neuroradiology | Admitting: Neuroradiology

## 2022-10-31 DIAGNOSIS — R911 Solitary pulmonary nodule: Secondary | ICD-10-CM

## 2022-10-31 DIAGNOSIS — I671 Cerebral aneurysm, nonruptured: Secondary | ICD-10-CM | POA: Insufficient documentation

## 2022-11-07 ENCOUNTER — Telehealth (HOSPITAL_COMMUNITY): Payer: Self-pay

## 2022-11-07 NOTE — Telephone Encounter (Signed)
Pt agreed to f/u in 1 year with an mra. AB  

## 2022-11-08 ENCOUNTER — Ambulatory Visit (INDEPENDENT_AMBULATORY_CARE_PROVIDER_SITE_OTHER): Payer: Medicare PPO | Admitting: Vascular Surgery

## 2022-11-08 ENCOUNTER — Encounter (INDEPENDENT_AMBULATORY_CARE_PROVIDER_SITE_OTHER): Payer: Self-pay | Admitting: Vascular Surgery

## 2022-11-08 VITALS — BP 139/72 | HR 62 | Resp 16 | Wt 197.0 lb

## 2022-11-08 DIAGNOSIS — I721 Aneurysm of artery of upper extremity: Secondary | ICD-10-CM

## 2022-11-08 NOTE — Progress Notes (Signed)
MRN : [id number]  Timothy Reeves. is a 67 y.o. ([birth year]) male who presents with chief complaint of check circulation.  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     Current Meds  Medication Sig   acetaminophen (TYLENOL) 500 MG tablet Take 1,000 mg by mouth every 6 (six) hours as needed for moderate pain.   aspirin EC 81 MG tablet Take 81 mg by mouth daily. Swallow whole.   Budeson-Glycopyrrol-Formoterol (BREZTRI AEROSPHERE) 160-9-4.8 MCG/ACT AERO Inhale 1 puff into the lungs 2 (two) times daily.   Calcium Carb-Cholecalciferol (CALCIUM 600 + D PO) Take 1 tablet by mouth 2 (two) times daily.   cetirizine (ZYRTEC) 10 MG tablet Take 10 mg by mouth daily as needed.   Cyanocobalamin (B-12) 1000 MCG TABS Take 1,000 mcg by mouth at bedtime.   IXEKIZUMAB [place] Inject into the skin every 6 (six) weeks.   losartan (COZAAR) 100 MG tablet Take 100 mg by mouth at bedtime.   pantoprazole (PROTONIX) 20 MG tablet Take 20 mg by mouth daily with supper.   PARoxetine (PAXIL) 10 MG tablet Take 10 mg by mouth at bedtime.   rosuvastatin (CRESTOR) 20 MG tablet Take 20 mg by mouth daily with supper.   triamcinolone (NASACORT) 55 MCG/ACT AERO nasal inhaler Place 2 sprays into the nose daily as needed (allergies).    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)    a.) 4.3 x 2.4 mm LEFT ICA/communicating segment aneurysm --> s/p placement of 2 overlapping pipeline flow diverters on 10/03/2020   Aneurysm of middle  cerebral artery    a.) 6.5 x 5.7 mm LEFT MCA bifurcation aneurysm --> 7 mm web device placed 10/03/2020   Anterior communicating artery aneurysm    a.) 3.6 mm RIGHT A2/ACA aneurysm s/p coil embolization 05/08/2021 --> 2.5 mm x 6 cm target Tetra foramen coil (aneurysm pouch) and 1.5 mm x 3 cm target Tetra finishing coil placed   Anxiety    Arthritis    Bilateral carotid artery disease (HCC) 08/27/2022   a.) carotid doppler 08/27/2022: 1-39% BICA   COPD (chronic obstructive pulmonary disease) (HCC)    Coronary artery disease 02/03/2008   a.) LHC 02/03/2008: 50% pLAD, 50% D1 - med mgmt; b.) LHC 11/30/2010: 40% pLAD - med mgmt   Discoid lupus    ED (erectile dysfunction)    GERD (gastroesophageal reflux disease)    Headache    History of 2019 novel coronavirus disease (COVID-19) 07/20/2020   HLD (hyperlipidemia)    Hypertension    Iatrogenic arteriovenous fistula (HCC)    Left bundle branch block (LBBB)    Long term current use of immunosuppressive  drug    a.) interleukin-17A blocker (ixekizumab) for psoriasis/psoriatic arthritis Dx   Long-term use of aspirin therapy    NICM (nonischemic cardiomyopathy) (HCC)    a.) MV 11/19/2019: EF 35%; b.) TTE 06/28/2020: EF 35%, mild LVH, mod global HK, mild MR/TR, RVSP 32.8; c.) TTE 06/18/2022: EF 40%, mild global HK, mild BAE, mild MR/TR, triv PR, RVSP 43.1   Pneumonia    Pre-diabetes    Psoriasis    Psoriatic arthritis (HCC)    a.) currently on ixekizumab (Taltz); b.) formerlly Tx'd with MTX (lost efficacy), adalimumab (lost efficacy), entanercept (effective; insurance changed and would not cover)    Past Surgical History:  Procedure Laterality Date   CARDIAC CATHETERIZATION     IR 3D INDEPENDENT WKST  09/22/2020   IR 3D INDEPENDENT WKST  10/04/2020   IR 3D INDEPENDENT WKST  05/08/2021   IR ANGIO INTRA EXTRACRAN SEL COM CAROTID INNOMINATE UNI L MOD SED  04/17/2021   IR ANGIO INTRA EXTRACRAN SEL INTERNAL CAROTID BILAT MOD SED  09/22/2020   IR  ANGIO INTRA EXTRACRAN SEL INTERNAL CAROTID BILAT MOD SED  10/17/2021   IR ANGIO INTRA EXTRACRAN SEL INTERNAL CAROTID UNI L MOD SED  10/03/2020   IR ANGIO INTRA EXTRACRAN SEL INTERNAL CAROTID UNI R MOD SED  04/17/2021   IR ANGIO INTRA EXTRACRAN SEL INTERNAL CAROTID UNI R MOD SED  05/11/2021   IR ANGIO VERTEBRAL SEL SUBCLAVIAN INNOMINATE UNI L MOD SED  09/22/2020   IR ANGIO VERTEBRAL SEL SUBCLAVIAN INNOMINATE UNI L MOD SED  10/17/2021   IR ANGIO VERTEBRAL SEL VERTEBRAL UNI L MOD SED  04/17/2021   IR ANGIO VERTEBRAL SEL VERTEBRAL UNI R MOD SED  09/22/2020   IR ANGIO VERTEBRAL SEL VERTEBRAL UNI R MOD SED  10/17/2021   IR ANGIOGRAM FOLLOW UP STUDY  10/04/2020   IR ANGIOGRAM FOLLOW UP STUDY  10/03/2020   IR ANGIOGRAM FOLLOW UP STUDY  05/08/2021   IR ANGIOGRAM FOLLOW UP STUDY  05/08/2021   IR CT HEAD LTD  10/03/2020   IR CT HEAD LTD  05/08/2021   IR NEURO EACH ADD'L AFTER BASIC UNI LEFT (MS)  10/05/2020   IR NEURO EACH ADD'L AFTER BASIC UNI RIGHT (MS)  05/11/2021   IR RADIOLOGIST EVAL & MGMT  05/05/2021   IR TRANSCATH/EMBOLIZ  10/03/2020   IR TRANSCATH/EMBOLIZ  05/08/2021   IR US GUIDE VASC ACCESS RIGHT  09/22/2020   IR US GUIDE VASC ACCESS RIGHT  10/03/2020   IR US GUIDE VASC ACCESS RIGHT  04/17/2021   IR US GUIDE VASC ACCESS RIGHT  05/08/2021   IR US GUIDE VASC ACCESS RIGHT  10/17/2021   RADIOLOGY WITH ANESTHESIA N/A 10/03/2020   Procedure: EMBOLIZATION WITH ANESTHESIA;  Surgeon: Baldemar Lenis, MD;  Location: [facility] OR;  Service: Radiology;  Laterality: N/A;   RADIOLOGY WITH ANESTHESIA N/A 05/08/2021   Procedure: EMBOLIZATION;  Surgeon: Baldemar Lenis, MD;  Location: [facility] OR;  Service: Radiology;  Laterality: N/A;   REVISON OF ARTERIOVENOUS FISTULA Right 09/14/2022   Procedure: REVISON OF ARTERIOVENOUS FISTULA (RADIAL ARTERY REPAIR - [id number]);  Surgeon: Renford Dills, MD;  Location: [facility] ORS;  Service: Vascular;  Laterality: Right;    Social History Social History   Tobacco Use   Smoking  status: Former    Current packs/day: 0.00    Average packs/day: 1 pack/day for 37.0 years (37.0 ttl pk-yrs)    Types: Cigarettes    Start date: 10/05/1971    Quit date: 10/04/2008  Years since quitting: 14.1   Smokeless tobacco: Never  Vaping Use   Vaping status: Never Used  Substance Use Topics   Alcohol use: Yes    Alcohol/week: 2.0 standard drinks of alcohol    Types: 2 Cans of beer per week   Drug use: Not Currently    Family History No family history on file.  Allergies  Allergen Reactions   Lipitor [Atorvastatin] Other (See Comments)    Muscle aches     REVIEW OF SYSTEMS (Negative unless checked)  Constitutional: [] Weight loss  [] Fever  [] Chills Cardiac: [] Chest pain   [] Chest pressure   [] Palpitations   [] Shortness of breath when laying flat   [] Shortness of breath with exertion. Vascular:  [x] Pain in legs with walking   [] Pain in legs at rest  [] History of DVT   [] Phlebitis   [] Swelling in legs   [] Varicose veins   [] Non-healing ulcers Pulmonary:   [] Uses home oxygen   [] Productive cough   [] Hemoptysis   [] Wheeze  [] COPD   [] Asthma Neurologic:  [] Dizziness   [] Seizures   [] History of stroke   [] History of TIA  [] Aphasia   [] Vissual changes   [] Weakness or numbness in arm   [] Weakness or numbness in leg Musculoskeletal:   [] Joint swelling   [] Joint pain   [] Low back pain Hematologic:  [] Easy bruising  [] Easy bleeding   [] Hypercoagulable state   [] Anemic Gastrointestinal:  [] Diarrhea   [] Vomiting  [] Gastroesophageal reflux/heartburn   [] Difficulty swallowing. Genitourinary:  [] Chronic kidney disease   [] Difficult urination  [] Frequent urination   [] Blood in urine Skin:  [] Rashes   [] Ulcers  Psychological:  [] History of anxiety   []  History of major depression.  Physical Examination  Vitals:   11/08/22 0942  BP: 139/72  Pulse: 62  Resp: 16  Weight: 197 lb (89.4 kg)   Body mass index is 25.99 kg/m. Gen: WD/WN, NAD Head: [place]/AT, No temporalis wasting.   Ear/Nose/Throat: Hearing grossly intact, nares w/o erythema or drainage Eyes: PER, EOMI, sclera nonicteric.  Neck: Supple, no masses.  No bruit or JVD.  Pulmonary:  Good air movement, no audible wheezing, no use of accessory muscles.  Cardiac: RRR, normal S1, S2, no Murmurs. Vascular:   Vessel Right Left  Radial Palpable Palpable  Gastrointestinal: soft, non-distended. No guarding/no peritoneal signs.  Musculoskeletal: M/S 5/5 throughout.  No visible deformity.  Neurologic: CN 2-12 intact. Pain and light touch intact in extremities.  Symmetrical.  Speech is fluent. Motor exam as listed above. Psychiatric: Judgment intact, Mood & affect appropriate for pt's clinical situation. Dermatologic: No rashes or ulcers noted.  No changes consistent with cellulitis.   CBC Lab Results  Component Value Date   WBC 4.9 09/11/2022   HGB 13.4 09/11/2022   HCT 39.8 09/11/2022   MCV 89.8 09/11/2022   PLT 236 09/11/2022    BMET    Component Value Date/Time   NA 138 09/11/2022 1527   K 4.3 09/11/2022 1527   CL 111 09/11/2022 1527   CO2 21 (L) 09/11/2022 1527   GLUCOSE 105 (H) 09/11/2022 1527   BUN 21 09/11/2022 1527   CREATININE 1.13 09/11/2022 1527   CALCIUM 8.9 09/11/2022 1527   GFRNONAA >60 09/11/2022 1527   CrCl cannot be calculated (Patient's most recent lab result is older than the maximum 21 days allowed.).  COAG Lab Results  Component Value Date   INR 1.0 10/17/2021   INR 1.0 05/08/2021   INR 1.0 04/14/2021    Radiology MR ANGIO HEAD  WO CONTRAST  Result Date: 11/07/2022 CLINICAL DATA:  Provided history: Brain aneurysm. EXAM: MRA HEAD WITHOUT CONTRAST TECHNIQUE: Angiographic images of the Circle of Willis were acquired using MRA technique without intravenous contrast. COMPARISON:  MRA head 03/21/2022. Report from catheter-based cerebral angiogram 10/17/2021. MRA head 11/04/2020. FINDINGS: Anterior circulation: Prior treatment of a posterior communicating segment left ICA  aneurysm with two telescoping flow diverters, prior coil embolization of an anterior communicating artery region aneurysm and embolization of a left MCA bifurcation aneurysm with a web device. No evidence of residual/recurrent flow in these treated aneurysms. As before, there is artifact arising from flow diverters in the region of the paraclinoid/supraclinoid left ICA. The intracranial internal carotid arteries are patent elsewhere. The M1 middle cerebral arteries are patent. 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 No M2 proximal branch occlusion or high-grade proximal stenosis. The anterior cerebral arteries are patent. Posterior circulation: The intracranial vertebral arteries are patent. The intracranial right vertebral artery is developmentally diminutive beyond the PICA origin. The basilar artery is patent. The posterior cerebral arteries are patent. Posterior communicating arteries are diminutive or absent, bilaterally. Anatomic variants: As described. IMPRESSION: No evidence of residual/recurrent flow in the treated posterior communicating segment left ICA aneurysm, in the treated left MCA bifurcation aneurysm or in the treated anterior communicating artery region aneurysm. Electronically Signed   By: Jackey Loge D.O.   On: 11/07/2022 14:14     Assessment/Plan 1. 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  No further invasive procedures.  I will schedule him for a duplex ultrasound as routine follow-up. - VAS [place] UPPER EXTREMITY ARTERIAL DUPLEX; Future    Levora Dredge, MD  11/08/2022 10:20 AM

## 2022-11-11 ENCOUNTER — Encounter (INDEPENDENT_AMBULATORY_CARE_PROVIDER_SITE_OTHER): Payer: Self-pay | Admitting: Vascular Surgery

## 2022-11-13 ENCOUNTER — Encounter: Payer: Self-pay | Admitting: Dermatology

## 2022-11-13 ENCOUNTER — Ambulatory Visit: Payer: Medicare PPO | Admitting: Dermatology

## 2022-11-13 DIAGNOSIS — L905 Scar conditions and fibrosis of skin: Secondary | ICD-10-CM | POA: Diagnosis not present

## 2022-11-13 DIAGNOSIS — L4050 Arthropathic psoriasis, unspecified: Secondary | ICD-10-CM

## 2022-11-13 DIAGNOSIS — L930 Discoid lupus erythematosus: Secondary | ICD-10-CM | POA: Diagnosis not present

## 2022-11-13 DIAGNOSIS — Q809 Congenital ichthyosis, unspecified: Secondary | ICD-10-CM

## 2022-11-13 DIAGNOSIS — L409 Psoriasis, unspecified: Secondary | ICD-10-CM

## 2022-11-13 DIAGNOSIS — Z79899 Other long term (current) drug therapy: Secondary | ICD-10-CM

## 2022-11-13 DIAGNOSIS — L820 Inflamed seborrheic keratosis: Secondary | ICD-10-CM

## 2022-11-13 DIAGNOSIS — Z7189 Other specified counseling: Secondary | ICD-10-CM

## 2022-11-13 MED ORDER — CLOBETASOL PROPIONATE 0.05 % EX CREA: 1.0000 | TOPICAL_CREAM | Freq: Two times a day (BID) | CUTANEOUS | 1 refills | Status: AC

## 2022-11-13 NOTE — Progress Notes (Signed)
Follow-Up Visit   Subjective  Timothy Reeves. is a 67 y.o. male who presents for the following: Psoriasis  At arms, legs, back. Patient never started [place]. He was on Taltz but got better. He hasn't been on Taltz for about 4 months and is not using any topicals. He is only using Jergen's lotion but still itching.  The following portions of the chart were reviewed this encounter and updated as appropriate: medications, allergies, medical history  Review of Systems:  No other skin or systemic complaints except as noted in HPI or Assessment and Plan.  Objective  Well appearing patient in no apparent distress; mood and affect are within normal limits.  Areas Examined: Legs, arms, face, trunk  Relevant exam findings are noted in the Assessment and Plan.    R mid back Erythematous stuck-on, waxy papule or plaque    Assessment & Plan   Inflamed seborrheic keratosis R mid back  Symptomatic, irritating, patient would like treated.  Benign-appearing.  Call clinic for new or changing lesions.   Patient to RTC if still present in 1 month   Destruction of lesion - R mid back Complexity: simple   Destruction method: cryotherapy   Informed consent: discussed and consent obtained   Timeout:  patient name, date of birth, surgical site, and procedure verified Lesion destroyed using liquid nitrogen: Yes   Region frozen until ice ball extended beyond lesion: Yes   Cryo cycles: 1 or 2. Outcome: patient tolerated procedure well with no complications   Post-procedure details: wound care instructions given      PSORIASIS, chronic plaque and guttate, nail psoriasis, known psoriatic arthrititis Well-demarcated erythematous papules/plaques with silvery scale, guttate pink scaly papules on trunk, extremities. Erythema and onycholysis of fingernails. 40% BSA.  Chronic and persistent condition with duration or expected duration over one year. Condition is bothersome/symptomatic for  patient. Currently flared.   Treatment Plan: Recommend patient reactivate prescription for Taltz with Dr. Allena Katz. If patient unable to renew with Dr. Allena Katz, we can send in.   Start clobetasol 0.05% cream twice daily to affected areas until smooth. Avoid applying to face, groin, and axilla. Use as directed. Long-term use can cause thinning of the skin.  Topical steroids (such as triamcinolone, fluocinolone, fluocinonide, mometasone, clobetasol, halobetasol, betamethasone, hydrocortisone) can cause thinning and lightening of the skin if they are used for too long in the same area. Your physician has selected the right strength medicine for your problem and area affected on the body. Please use your medication only as directed by your physician to prevent side effects.   Message sent to rheumatology today: Dear Dr Allena Katz and PA [place],  I saw your patient in dermatology clinic for a rash. The patient has a severe psoriasis flare. I see that you were treating psoriatic arthritis with Taltz. The patient stopped taking it a few months ago due to feeling well. Given the psoriasis recurrence, it needs to be restarted. I can't see a recent quantiferon gold in our system. I didn't want to necessarily take over the Taltz, so are you able to order a quantiferon gold (if needed) and ensure the patient's Taltz prescription is still active? I'm happy to do it if you prefer.  Thank you, Herbert Deaner, MD PhD, [facility]  Counseling on psoriasis and coordination of care  psoriasis is a chronic non-curable, but treatable genetic/hereditary disease that may have other systemic features affecting other organ systems such as joints (Psoriatic Arthritis). It is associated with  an increased risk of inflammatory bowel disease, heart disease, non-alcoholic fatty liver disease, and depression.  Treatments include light and laser treatments; topical medications; and systemic medications including oral and  injectables.  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 Benign-appearing.  Observation.  Call clinic for new or changing lesions. Recommend daily broad spectrum sunscreen SPF 30+, reapply every 2 hours as needed. Treatment: Recommend Serica moisturizing scar formula cream every night or [facility] brand or Mederma silicone scar sheet every night for the first year after a scar appears to help with scar remodeling if desired. Scars remodel on their own for a full year and will gradually improve in appearance  over time. - Sent referral to plastics per patient request. Photos in Epic on 01/28/20  ICTHYOSIS - diffuse xerotic patches - recommend gentle, hydrating skin care with chemical exfoliants such as amlactin and cerave-SA - gentle skin care handout given    Return in about 3 months (around 02/13/2023) for Psoriasis, with Dr. Katrinka Blazing.  Anise Salvo, RMA, am acting as scribe for Elie Goody, MD .   Documentation: I have reviewed the above documentation for accuracy and completeness, and I agree with the above.  Elie Goody, MD

## 2022-11-13 NOTE — Patient Instructions (Addendum)
Cryotherapy Aftercare  Wash gently with soap and water everyday.   Apply Vaseline and Band-Aid daily until healed.   Recommend starting moisturizer with exfoliant (Urea, Salicylic acid, or Lactic acid) one to two times daily to help smooth rough and bumpy skin.  OTC options include Cetaphil Rough and Bumpy lotion (Urea), Eucerin Roughness Relief lotion or spot treatment cream (Urea), CeraVe SA lotion/cream for Rough and Bumpy skin (Sal Acid), Gold Bond Rough and Bumpy cream (Sal Acid), and AmLactin 12% lotion/cream (Lactic Acid).  If applying in morning, also apply sunscreen to sun-exposed areas, since these exfoliating moisturizers can increase sensitivity to sun.  Start clobetasol 0.05% cream twice daily to affected areas until smooth. Avoid applying to face, groin, and axilla. Use as directed. Long-term use can cause thinning of the skin.  Due to recent changes in healthcare laws, you may see results of your pathology and/or laboratory studies on MyChart before the doctors have had a chance to review them. We understand that in some cases there may be results that are confusing or concerning to you. Please understand that not all results are received at the same time and often the doctors may need to interpret multiple results in order to provide you with the best plan of care or course of treatment. Therefore, we ask that you please give [place] 2 business days to thoroughly review all your results before contacting the office for clarification. Should we see a critical lab result, you will be contacted sooner.   If You Need Anything After Your Visit  If you have any questions or concerns for your doctor, please call our main line at [phone] and press option 4 to reach your doctor's medical assistant. If no one answers, please leave a voicemail as directed and we will return your call as soon as possible. Messages left after 4 pm will be answered the following business day.   You may also send [place]  a message via MyChart. We typically respond to MyChart messages within 1-2 business days.  For prescription refills, please ask your pharmacy to contact our office. Our fax number is [phone].  If you have an urgent issue when the clinic is closed that cannot wait until the next business day, you can page your doctor at the number below.    Please note that while we do our best to be available for urgent issues outside of office hours, we are not available 24/7.   If you have an urgent issue and are unable to reach [place], you may choose to seek medical care at your doctor's office, retail clinic, urgent care center, or emergency room.  If you have a medical emergency, please immediately call 911 or go to the emergency department.  Pager Numbers  - Dr. Gwen Pounds: [phone]  - Dr. Roseanne Reno: [phone]  - Dr. Katrinka Blazing: [phone]   In the event of inclement weather, please call our main line at [phone] for an update on the status of any delays or closures.  Dermatology Medication Tips: Please keep the boxes that topical medications come in in order to help keep track of the instructions about where and how to use these. Pharmacies typically print the medication instructions only on the boxes and not directly on the medication tubes.   If your medication is too expensive, please contact our office at [phone] option 4 or send [place] a message through MyChart.   We are unable to tell what your co-pay for medications will be in advance as this  is different depending on your insurance coverage. However, we may be able to find a substitute medication at lower cost or fill out paperwork to get insurance to cover a needed medication.   If a prior authorization is required to get your medication covered by your insurance company, please allow [place] 1-2 business days to complete this process.  Drug prices often vary depending on where the prescription is filled and some pharmacies may offer  cheaper prices.  The website www.goodrx.com contains coupons for medications through different pharmacies. The prices here do not account for what the cost may be with help from insurance (it may be cheaper with your insurance), but the website can give you the price if you did not use any insurance.  - You can print the associated coupon and take it with your prescription to the pharmacy.  - You may also stop by our office during regular business hours and pick up a GoodRx coupon card.  - If you need your prescription sent electronically to a different pharmacy, notify our office through [facility] or by phone at [phone] option 4.

## 2022-11-14 ENCOUNTER — Ambulatory Visit: Payer: Medicare PPO | Admitting: Plastic Surgery

## 2022-11-14 VITALS — BP 164/76 | HR 71

## 2022-11-14 DIAGNOSIS — L905 Scar conditions and fibrosis of skin: Secondary | ICD-10-CM | POA: Diagnosis not present

## 2022-11-14 DIAGNOSIS — L930 Discoid lupus erythematosus: Secondary | ICD-10-CM | POA: Diagnosis not present

## 2022-11-14 NOTE — Progress Notes (Signed)
Referring Provider Marisue Ivan, MD [phone] [facility] [address] [facility] [place],  [place] [address]   CC:  Chief Complaint  Patient presents with   Skin Problem      Timothy Reeves. is an 67 y.o. male.  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  Patient has had difficulty with this problem for much of his life however the particular scars that he currently has or due to a flare in 1983.  He is unhappy with the overall appearance of his face due to the scars.  He is interested in knowing if anything can be done.  Allergies  Allergen Reactions   Lipitor [Atorvastatin] Other (See Comments)    Muscle aches    Outpatient Encounter Medications as of 11/14/2022  Medication Sig   acetaminophen (TYLENOL) 500 MG tablet Take 1,000 mg by mouth every 6 (six) hours as needed for moderate pain.   aspirin EC 81 MG tablet Take 81 mg by mouth daily. Swallow whole.   Budeson-Glycopyrrol-Formoterol (BREZTRI AEROSPHERE) 160-9-4.8 MCG/ACT AERO Inhale 1 puff into the lungs 2 (two) times daily.   Calcium Carb-Cholecalciferol (CALCIUM 600 + D PO) Take 1 tablet by mouth 2 (two) times daily.   cetirizine (ZYRTEC) 10 MG tablet Take 10 mg by mouth daily as needed.   clobetasol cream (TEMOVATE) 0.05 % Apply 1 Application topically 2 (two) times daily. To affected areas of psoriasis. Avoid applying to face, groin, and axilla. Use as directed. Long-term use can cause thinning of the skin.   Cyanocobalamin (B-12) 1000 MCG TABS Take 1,000 mcg by mouth at bedtime.   HYDROcodone-acetaminophen (NORCO) 5-325 MG tablet Take 1 tablet by mouth every 6 (six) hours as needed for moderate pain.   IXEKIZUMAB [place] Inject into the skin every 6 (six) weeks.   losartan (COZAAR) 100 MG tablet Take 100 mg by mouth at bedtime.   pantoprazole (PROTONIX) 20 MG tablet Take 20 mg by mouth daily with supper.   PARoxetine (PAXIL) 10  MG tablet Take 10 mg by mouth at bedtime.   rosuvastatin (CRESTOR) 20 MG tablet Take 20 mg by mouth daily with supper.   triamcinolone (NASACORT) 55 MCG/ACT AERO nasal inhaler Place 2 sprays into the nose daily as needed (allergies).   ipratropium (ATROVENT) 0.06 % nasal spray Place 2 sprays into both nostrils daily as needed for rhinitis.   No facility-administered encounter medications on file as of 11/14/2022.     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)    a.) 4.3 x 2.4 mm LEFT ICA/communicating segment aneurysm --> s/p placement of 2 overlapping pipeline flow diverters on 10/03/2020   Aneurysm of middle cerebral artery    a.) 6.5 x 5.7 mm LEFT MCA bifurcation aneurysm --> 7 mm web device placed 10/03/2020   Anterior communicating artery aneurysm    a.) 3.6 mm RIGHT A2/ACA aneurysm s/p coil embolization 05/08/2021 --> 2.5 mm x 6 cm target Tetra foramen coil (aneurysm pouch) and 1.5 mm x 3 cm target Tetra finishing coil placed   Anxiety    Arthritis    Bilateral carotid artery disease (HCC) 08/27/2022   a.) carotid doppler 08/27/2022: 1-39% BICA   COPD (chronic obstructive pulmonary disease) (HCC)    Coronary artery disease 02/03/2008   a.) LHC 02/03/2008: 50% pLAD, 50% D1 - med mgmt; b.) LHC  11/30/2010: 40% pLAD - med mgmt   Discoid lupus    ED (erectile dysfunction)    GERD (gastroesophageal reflux disease)    Headache    History of 2019 novel coronavirus disease (COVID-19) 07/20/2020   HLD (hyperlipidemia)    Hypertension    Iatrogenic arteriovenous fistula (HCC)    Left bundle branch block (LBBB)    Long term current use of immunosuppressive drug    a.) interleukin-17A blocker (ixekizumab) for psoriasis/psoriatic arthritis Dx   Long-term use of aspirin therapy    NICM (nonischemic cardiomyopathy) (HCC)    a.) MV 11/19/2019: EF 35%; b.) TTE 06/28/2020: EF 35%, mild LVH, mod global HK, mild MR/TR, RVSP  32.8; c.) TTE 06/18/2022: EF 40%, mild global HK, mild BAE, mild MR/TR, triv PR, RVSP 43.1   Pneumonia    Pre-diabetes    Psoriasis    Psoriatic arthritis (HCC)    a.) currently on ixekizumab (Taltz); b.) formerlly Tx'd with MTX (lost efficacy), adalimumab (lost efficacy), entanercept (effective; insurance changed and would not cover)    Past Surgical History:  Procedure Laterality Date   CARDIAC CATHETERIZATION     IR 3D INDEPENDENT WKST  09/22/2020   IR 3D INDEPENDENT WKST  10/04/2020   IR 3D INDEPENDENT WKST  05/08/2021   IR ANGIO INTRA EXTRACRAN SEL COM CAROTID INNOMINATE UNI L MOD SED  04/17/2021   IR ANGIO INTRA EXTRACRAN SEL INTERNAL CAROTID BILAT MOD SED  09/22/2020   IR ANGIO INTRA EXTRACRAN SEL INTERNAL CAROTID BILAT MOD SED  10/17/2021   IR ANGIO INTRA EXTRACRAN SEL INTERNAL CAROTID UNI L MOD SED  10/03/2020   IR ANGIO INTRA EXTRACRAN SEL INTERNAL CAROTID UNI R MOD SED  04/17/2021   IR ANGIO INTRA EXTRACRAN SEL INTERNAL CAROTID UNI R MOD SED  05/11/2021   IR ANGIO VERTEBRAL SEL SUBCLAVIAN INNOMINATE UNI L MOD SED  09/22/2020   IR ANGIO VERTEBRAL SEL SUBCLAVIAN INNOMINATE UNI L MOD SED  10/17/2021   IR ANGIO VERTEBRAL SEL VERTEBRAL UNI L MOD SED  04/17/2021   IR ANGIO VERTEBRAL SEL VERTEBRAL UNI R MOD SED  09/22/2020   IR ANGIO VERTEBRAL SEL VERTEBRAL UNI R MOD SED  10/17/2021   IR ANGIOGRAM FOLLOW UP STUDY  10/04/2020   IR ANGIOGRAM FOLLOW UP STUDY  10/03/2020   IR ANGIOGRAM FOLLOW UP STUDY  05/08/2021   IR ANGIOGRAM FOLLOW UP STUDY  05/08/2021   IR CT HEAD LTD  10/03/2020   IR CT HEAD LTD  05/08/2021   IR NEURO EACH ADD'L AFTER BASIC UNI LEFT (MS)  10/05/2020   IR NEURO EACH ADD'L AFTER BASIC UNI RIGHT (MS)  05/11/2021   IR RADIOLOGIST EVAL & MGMT  05/05/2021   IR TRANSCATH/EMBOLIZ  10/03/2020   IR TRANSCATH/EMBOLIZ  05/08/2021   IR US GUIDE VASC ACCESS RIGHT  09/22/2020   IR US GUIDE VASC ACCESS RIGHT  10/03/2020   IR US GUIDE VASC ACCESS RIGHT  04/17/2021   IR US GUIDE VASC ACCESS RIGHT   05/08/2021   IR US GUIDE VASC ACCESS RIGHT  10/17/2021   RADIOLOGY WITH ANESTHESIA N/A 10/03/2020   Procedure: EMBOLIZATION WITH ANESTHESIA;  Surgeon: Baldemar Lenis, MD;  Location: [facility] OR;  Service: Radiology;  Laterality: N/A;   RADIOLOGY WITH ANESTHESIA N/A 05/08/2021   Procedure: EMBOLIZATION;  Surgeon: Baldemar Lenis, MD;  Location: [facility] OR;  Service: Radiology;  Laterality: N/A;   REVISON OF ARTERIOVENOUS FISTULA Right 09/14/2022   Procedure: REVISON OF ARTERIOVENOUS FISTULA (RADIAL ARTERY REPAIR -  [id number]);  Surgeon: Renford Dills, MD;  Location: [facility] ORS;  Service: Vascular;  Laterality: Right;    No family history on file.  Social History   Social History Narrative   Not on file     Review of Systems General: Denies fevers, chills, weight loss CV: Denies chest pain, shortness of breath, palpitations Rheumatologic: Lupus, psoriasis Skin: Guards from discoid lupus  Physical Exam    11/14/2022    2:03 PM 11/08/2022    9:42 AM 10/08/2022    1:24 PM  Vitals with BMI  Weight  197 lbs 197 lbs 6 oz  Systolic 164 139 469  Diastolic 76 72 74  Pulse 71 62 69    General:  No acute distress,  Alert and oriented, Non-Toxic, Normal speech and affect Integument: Patient has 5 particularly obvious scars on his face 1 on each cheek 2 on the upper lip and 1 in the glabellar region.  No obvious ongoing infection in any of these.   Assessment/Plan Discoid lupus scarring: Patient has extensive scars on his face including 2 on his cheeks which are his most bothersome.  Compared to photographs that were taken in 2022 the scars actually look somewhat better.  I discussed with Timothy Reeves and his wife that there is no good treatment for the scarring from discoid lupus.  1 option, though I have never tried it, might be the BBL laser for pigment.  The risk with this is that the skin can become burned and the scars may be worse.  We discussed at length options for covering the  scars with a concealer make-up and strategies for scar management including scar massage, scar creams with silicone, silicone sheeting, and sun avoidance.  After extensive discussion the patient, and I believe this is the best option, would like to try the scar mitigation strategies to see if the appearance of the scar is improved enough to make him happy.  If after 6 months of trying the strategies we discussed he is still not happy with the appearance of the scar of asked him to return so that we can further discuss the possibility of using the laser to remove some of the pigment from his scar.  I spent 30 minutes reviewing the chart, reviewing literature, examining the patient, discussing the patient's case and possible treatment options, and documenting care.  Santiago Glad 11/14/2022, 3:41 PM

## 2022-11-15 ENCOUNTER — Encounter: Payer: Self-pay | Admitting: Dermatology

## 2022-11-15 ENCOUNTER — Other Ambulatory Visit: Payer: Self-pay | Admitting: Dermatology

## 2022-11-15 DIAGNOSIS — Z79899 Other long term (current) drug therapy: Secondary | ICD-10-CM

## 2022-11-15 DIAGNOSIS — L409 Psoriasis, unspecified: Secondary | ICD-10-CM

## 2022-11-23 ENCOUNTER — Ambulatory Visit
Admission: RE | Admit: 2022-11-23 | Discharge: 2022-11-23 | Disposition: A | Discharge status: Home / Self Care | Payer: Medicare PPO | Source: Ambulatory Visit | Attending: Pulmonary Disease | Admitting: Pulmonary Disease

## 2022-11-23 DIAGNOSIS — R911 Solitary pulmonary nodule: Secondary | ICD-10-CM | POA: Insufficient documentation

## 2022-12-10 ENCOUNTER — Encounter (INDEPENDENT_AMBULATORY_CARE_PROVIDER_SITE_OTHER): Payer: Self-pay | Admitting: Vascular Surgery

## 2022-12-10 ENCOUNTER — Ambulatory Visit (INDEPENDENT_AMBULATORY_CARE_PROVIDER_SITE_OTHER): Payer: Medicare PPO

## 2022-12-10 ENCOUNTER — Ambulatory Visit (INDEPENDENT_AMBULATORY_CARE_PROVIDER_SITE_OTHER): Payer: Medicare PPO | Admitting: Vascular Surgery

## 2022-12-10 VITALS — BP 119/66 | HR 74 | Resp 16 | Wt 198.6 lb

## 2022-12-10 DIAGNOSIS — E782 Mixed hyperlipidemia: Secondary | ICD-10-CM

## 2022-12-10 DIAGNOSIS — I10 Essential (primary) hypertension: Secondary | ICD-10-CM

## 2022-12-10 DIAGNOSIS — I721 Aneurysm of artery of upper extremity: Secondary | ICD-10-CM

## 2022-12-10 DIAGNOSIS — J449 Chronic obstructive pulmonary disease, unspecified: Secondary | ICD-10-CM

## 2022-12-10 DIAGNOSIS — I6523 Occlusion and stenosis of bilateral carotid arteries: Secondary | ICD-10-CM

## 2022-12-10 NOTE — Progress Notes (Signed)
MRN : [id number]  Timothy Reeves. is a 67 y.o. (01-May-1955) male who presents with chief complaint of check circulation.  History of Present Illness:   PROCEDURE 09/14/2022: Resection of right radial artery pseudoaneurysm with repair of right radial artery   Patient returns today for his routine follow-up.  He has no new complaints.  He has resumed his normal activities and is working in his shop.  He has no pain in his hand or his wrist.    He continues to have syncope and is following up with his cardiologist later this month.  Duplex ultrasound of the right upper extremity obtained today demonstrates a normal arterial system.  There is no evidence for AV fistula or aneurysm formation.  Repair appears intact.  No outpatient medications have been marked as taking for the 12/10/22 encounter (Appointment) with Gilda Crease, Latina Craver, MD.    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)    a.) 4.3 x 2.4 mm LEFT ICA/communicating segment aneurysm --> s/p placement of 2 overlapping pipeline flow diverters on 10/03/2020   Aneurysm of middle cerebral artery    a.) 6.5 x 5.7 mm LEFT MCA bifurcation aneurysm --> 7 mm web device placed 10/03/2020   Anterior communicating artery aneurysm    a.) 3.6 mm RIGHT A2/ACA aneurysm s/p coil embolization 05/08/2021 --> 2.5 mm x 6 cm target Tetra foramen coil (aneurysm pouch) and 1.5 mm x 3 cm target Tetra finishing coil placed   Anxiety    Arthritis    Bilateral carotid artery disease (HCC) 08/27/2022   a.) carotid doppler 08/27/2022: 1-39% BICA   COPD (chronic obstructive pulmonary disease) (HCC)    Coronary artery disease 02/03/2008   a.) LHC 02/03/2008: 50% pLAD, 50% D1 - med mgmt; b.) LHC 11/30/2010: 40% pLAD - med mgmt   Discoid lupus    ED (erectile dysfunction)    GERD (gastroesophageal reflux disease)     Headache    History of 2019 novel coronavirus disease (COVID-19) 07/20/2020   HLD (hyperlipidemia)    Hypertension    Iatrogenic arteriovenous fistula (HCC)    Left bundle branch block (LBBB)    Long term current use of immunosuppressive drug    a.) interleukin-17A blocker (ixekizumab) for psoriasis/psoriatic arthritis Dx   Long-term use of aspirin therapy    NICM (nonischemic cardiomyopathy) (HCC)    a.) MV 11/19/2019: EF 35%; b.) TTE 06/28/2020: EF 35%, mild LVH, mod global HK, mild MR/TR, RVSP 32.8; c.) TTE 06/18/2022: EF 40%, mild global HK, mild BAE, mild MR/TR, triv PR, RVSP 43.1   Pneumonia    Pre-diabetes    Psoriasis    Psoriatic arthritis (HCC)    a.) currently on ixekizumab (Taltz); b.) formerlly Tx'd with MTX (lost efficacy), adalimumab (lost efficacy), entanercept (effective; insurance changed and would not cover)    Past Surgical History:  Procedure Laterality Date   CARDIAC CATHETERIZATION     IR 3D INDEPENDENT WKST  09/22/2020   IR 3D INDEPENDENT WKST  10/04/2020   IR 3D INDEPENDENT  WKST  05/08/2021   IR ANGIO INTRA EXTRACRAN SEL COM CAROTID INNOMINATE UNI L MOD SED  04/17/2021   IR ANGIO INTRA EXTRACRAN SEL INTERNAL CAROTID BILAT MOD SED  09/22/2020   IR ANGIO INTRA EXTRACRAN SEL INTERNAL CAROTID BILAT MOD SED  10/17/2021   IR ANGIO INTRA EXTRACRAN SEL INTERNAL CAROTID UNI L MOD SED  10/03/2020   IR ANGIO INTRA EXTRACRAN SEL INTERNAL CAROTID UNI R MOD SED  04/17/2021   IR ANGIO INTRA EXTRACRAN SEL INTERNAL CAROTID UNI R MOD SED  05/11/2021   IR ANGIO VERTEBRAL SEL SUBCLAVIAN INNOMINATE UNI L MOD SED  09/22/2020   IR ANGIO VERTEBRAL SEL SUBCLAVIAN INNOMINATE UNI L MOD SED  10/17/2021   IR ANGIO VERTEBRAL SEL VERTEBRAL UNI L MOD SED  04/17/2021   IR ANGIO VERTEBRAL SEL VERTEBRAL UNI R MOD SED  09/22/2020   IR ANGIO VERTEBRAL SEL VERTEBRAL UNI R MOD SED  10/17/2021   IR ANGIOGRAM FOLLOW UP STUDY  10/04/2020   IR ANGIOGRAM FOLLOW UP STUDY  10/03/2020   IR ANGIOGRAM FOLLOW UP STUDY   05/08/2021   IR ANGIOGRAM FOLLOW UP STUDY  05/08/2021   IR CT HEAD LTD  10/03/2020   IR CT HEAD LTD  05/08/2021   IR NEURO EACH ADD'L AFTER BASIC UNI LEFT (MS)  10/05/2020   IR NEURO EACH ADD'L AFTER BASIC UNI RIGHT (MS)  05/11/2021   IR RADIOLOGIST EVAL & MGMT  05/05/2021   IR TRANSCATH/EMBOLIZ  10/03/2020   IR TRANSCATH/EMBOLIZ  05/08/2021   IR US GUIDE VASC ACCESS RIGHT  09/22/2020   IR US GUIDE VASC ACCESS RIGHT  10/03/2020   IR US GUIDE VASC ACCESS RIGHT  04/17/2021   IR US GUIDE VASC ACCESS RIGHT  05/08/2021   IR US GUIDE VASC ACCESS RIGHT  10/17/2021   RADIOLOGY WITH ANESTHESIA N/A 10/03/2020   Procedure: EMBOLIZATION WITH ANESTHESIA;  Surgeon: Baldemar Lenis, MD;  Location: [facility] OR;  Service: Radiology;  Laterality: N/A;   RADIOLOGY WITH ANESTHESIA N/A 05/08/2021   Procedure: EMBOLIZATION;  Surgeon: Baldemar Lenis, MD;  Location: [facility] OR;  Service: Radiology;  Laterality: N/A;   REVISON OF ARTERIOVENOUS FISTULA Right 09/14/2022   Procedure: REVISON OF ARTERIOVENOUS FISTULA (RADIAL ARTERY REPAIR - [id number]);  Surgeon: Renford Dills, MD;  Location: [facility] ORS;  Service: Vascular;  Laterality: Right;    Social History Social History   Tobacco Use   Smoking status: Former    Current packs/day: 0.00    Average packs/day: 1 pack/day for 37.0 years (37.0 ttl pk-yrs)    Types: Cigarettes    Start date: 10/05/1971    Quit date: 10/04/2008    Years since quitting: 14.1   Smokeless tobacco: Never  Vaping Use   Vaping status: Never Used  Substance Use Topics   Alcohol use: Yes    Alcohol/week: 2.0 standard drinks of alcohol    Types: 2 Cans of beer per week   Drug use: Not Currently    Family History No family history on file.  Allergies  Allergen Reactions   Lipitor [Atorvastatin] Other (See Comments)    Muscle aches     REVIEW OF SYSTEMS (Negative unless checked)  Constitutional: [] Weight loss  [] Fever  [] Chills Cardiac: [] Chest pain   [] Chest pressure    [] Palpitations   [] Shortness of breath when laying flat   [] Shortness of breath with exertion. Vascular:  [x] Pain in legs with walking   [] Pain in legs at rest  [] History of DVT   []   Phlebitis   [] Swelling in legs   [] Varicose veins   [] Non-healing ulcers Pulmonary:   [] Uses home oxygen   [] Productive cough   [] Hemoptysis   [] Wheeze  [] COPD   [] Asthma Neurologic:  [] Dizziness   [] Seizures   [] History of stroke   [] History of TIA  [] Aphasia   [] Vissual changes   [] Weakness or numbness in arm   [] Weakness or numbness in leg Musculoskeletal:   [] Joint swelling   [x] Joint pain   [] Low back pain Hematologic:  [] Easy bruising  [] Easy bleeding   [] Hypercoagulable state   [] Anemic Gastrointestinal:  [] Diarrhea   [] Vomiting  [x] Gastroesophageal reflux/heartburn   [] Difficulty swallowing. Genitourinary:  [] Chronic kidney disease   [] Difficult urination  [] Frequent urination   [] Blood in urine Skin:  [] Rashes   [] Ulcers  Psychological:  [x] History of anxiety   []  History of major depression.  Physical Examination  There were no vitals filed for this visit. There is no height or weight on file to calculate BMI. Gen: WD/WN, NAD Head: [place]/AT, No temporalis wasting.  Ear/Nose/Throat: Hearing grossly intact, nares w/o erythema or drainage Eyes: PER, EOMI, sclera nonicteric.  Neck: Supple, no masses.  No bruit or JVD.  Pulmonary:  Good air movement, no audible wheezing, no use of accessory muscles.  Cardiac: RRR, normal S1, S2, no Murmurs. Vascular: Right wrist well-healed there is no evidence for further pseudoaneurysm or AV fistula., no open wounds Vessel Right Left  Radial Palpable Palpable  PT Not Palpable Not Palpable  DP Not Palpable Not Palpable  Gastrointestinal: soft, non-distended. No guarding/no peritoneal signs.  Musculoskeletal: M/S 5/5 throughout.  No visible deformity.  Neurologic: CN 2-12 intact. Pain and light touch intact in extremities.  Symmetrical.  Speech is fluent. Motor exam as  listed above. Psychiatric: Judgment intact, Mood & affect appropriate for pt's clinical situation. Dermatologic: No rashes or ulcers noted.  No changes consistent with cellulitis.   CBC Lab Results  Component Value Date   WBC 4.9 09/11/2022   HGB 13.4 09/11/2022   HCT 39.8 09/11/2022   MCV 89.8 09/11/2022   PLT 236 09/11/2022    BMET    Component Value Date/Time   NA 138 09/11/2022 1527   K 4.3 09/11/2022 1527   CL 111 09/11/2022 1527   CO2 21 (L) 09/11/2022 1527   GLUCOSE 105 (H) 09/11/2022 1527   BUN 21 09/11/2022 1527   CREATININE 1.13 09/11/2022 1527   CALCIUM 8.9 09/11/2022 1527   GFRNONAA >60 09/11/2022 1527   CrCl cannot be calculated (Patient's most recent lab result is older than the maximum 21 days allowed.).  COAG Lab Results  Component Value Date   INR 1.0 10/17/2021   INR 1.0 05/08/2021   INR 1.0 04/14/2021    Radiology CT CHEST WO CONTRAST  Result Date: 12/09/2022 CLINICAL DATA:  Follow-up nodules, currently asymptomatic EXAM: CT CHEST WITHOUT CONTRAST TECHNIQUE: Multidetector CT imaging of the chest was performed following the standard protocol without IV contrast. RADIATION DOSE REDUCTION: This exam was performed according to the departmental dose-optimization program which includes automated exposure control, adjustment of the mA and/or kV according to patient size and/or use of iterative reconstruction technique. COMPARISON:  12/13/2021 FINDINGS: Cardiovascular: Heart size normal. No pericardial effusion. Scattered coronary calcifications. Atheromatous thoracic aorta without aneurysm. Mediastinum/Nodes: No mass or adenopathy. Lungs/Pleura: No pleural effusion. No pneumothorax. Pulmonary emphysema. Several bilateral scattered small subpleural pulmonary nodules, largest 5 mm in the anterior right upper lobe (Im52,Se3) . 6 mm pleural based nodule in the superior  segment right lower lobe is stable since previous. The focal ground-glass opacities which were new  on the prior study have in the interval resolved consistent with inflammatory/infectious etiology. No new lesion. Upper Abdomen: No acute findings. Musculoskeletal: No chest wall mass or suspicious bone lesions identified. IMPRESSION: 1. Interval resolution of bilateral ground-glass opacities consistent with inflammatory/infectious etiology. 2. Stable small bilateral pulmonary nodules, largest 6 mm in the superior segment right lower lobe. Consider continued surveillance. 3. Aortic Atherosclerosis (ICD10-I70.0) and Emphysema (ICD10-J43.9). Electronically Signed   By: Corlis Leak M.D.   On: 12/09/2022 16:43     Assessment/Plan 1. Radial artery aneurysm, right (HCC) His repair appears intact and successful.  There are no further issues here and we will continue to monitor this with surveillance.  No further intervention is indicated. - VAS [place] UPPER EXTREMITY ARTERIAL DUPLEX; Future  2. Bilateral carotid artery stenosis Recommend:  Given the patient's asymptomatic subcritical stenosis no further invasive testing or surgery at this time.  Duplex ultrasound shows 1-39% stenosis bilaterally.  Continue antiplatelet therapy as prescribed Continue management of CAD, HTN and Hyperlipidemia Healthy heart diet,  encouraged exercise at least 4 times per week  Follow up in 12 months with duplex ultrasound and physical exam  - VAS US CAROTID; Future  3. Essential hypertension Continue antihypertensive medications as already ordered, these medications have been reviewed and there are no changes at this time.  4. COPD, mild (HCC) Continue pulmonary medications and aerosols as already ordered, these medications have been reviewed and there are no changes at this time.   5. Mixed hyperlipidemia Continue statin as ordered and reviewed, no changes at this time    Levora Dredge, MD  12/10/2022 1:03 PM

## 2023-01-09 ENCOUNTER — Encounter (INDEPENDENT_AMBULATORY_CARE_PROVIDER_SITE_OTHER): Payer: Medicare PPO

## 2023-01-09 ENCOUNTER — Ambulatory Visit (INDEPENDENT_AMBULATORY_CARE_PROVIDER_SITE_OTHER): Payer: Medicare PPO | Admitting: Nurse Practitioner

## 2023-01-14 IMAGING — US IR TRANSCATH EMBOLIZATION
1 series · 2 of 2 positions shown · non-contrast
Comparison: Cerebral angiograms April 17, 2021,October 03, 2020,September 22, 2020.

INDICATION: Cameca Nepomuceno. is a 65-year-old male with past medical history
significant for coronary artery disease, discoid lupus, erectile
dysfunction, gastroesophageal reflux disease with esophagitis,
generalized anxiety disorder, hypertension, psoriatic arthritis,
hypercholesterolemia and remote history of tobacco use having quit
12 years ago (37 pack years). He underwent an MRI and MRA of the
brain on 09/14/2020 as part of workup for dizziness with significant
findings of incidental 6 mm left ICA aneurysm and 9 mm left MCA
bifurcation aneurysm. His family history is significant for mom
passing away due to a ruptured brain aneurysm. He underwent a
diagnostic cerebral angiogram September 22, 2020 which confirmed the
presence of both aneurysms. He underwent elective treatment of these
aneurysm on October 03, 2020. He underwent a follow-up angiogram on
April 27, 2021 that revealed an irregularly shaped saccular aneurysm
of the proximal right A2/ACA. He comes to our service today for
elective endovascular embolization of the right ACA aneurysm.

EXAM:
ULTRASOUND-GUIDED VASCULAR [REDACTED] CEREBRAL ANGIOGRAM
3D ROTATIONAL ANGIOGRAM
ENDOVASCULAR ANEURYSM EMBOLIZATION
FLAT PANEL HEAD CT
TECHNIQUE: Informed written consent was obtained from the patient after a
thorough discussion of the procedural risks, benefits and
alternatives. All questions were addressed.

[Series 1: ir (id) (id)/(id) · 2 of 2 slices shown]
[im 1/2]
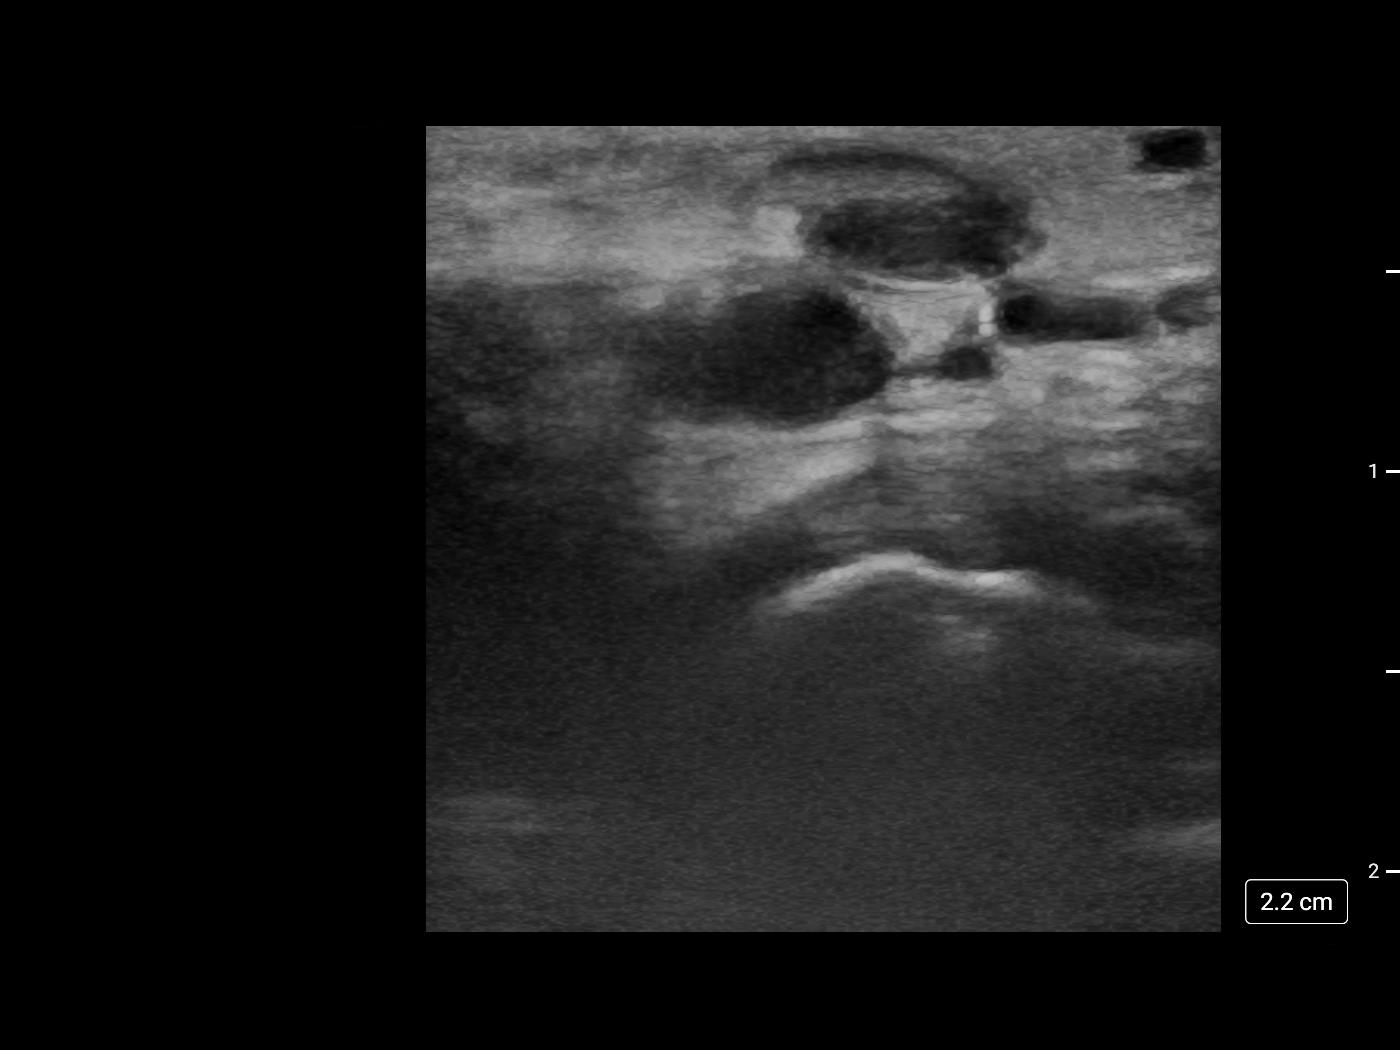
[im 2/2]
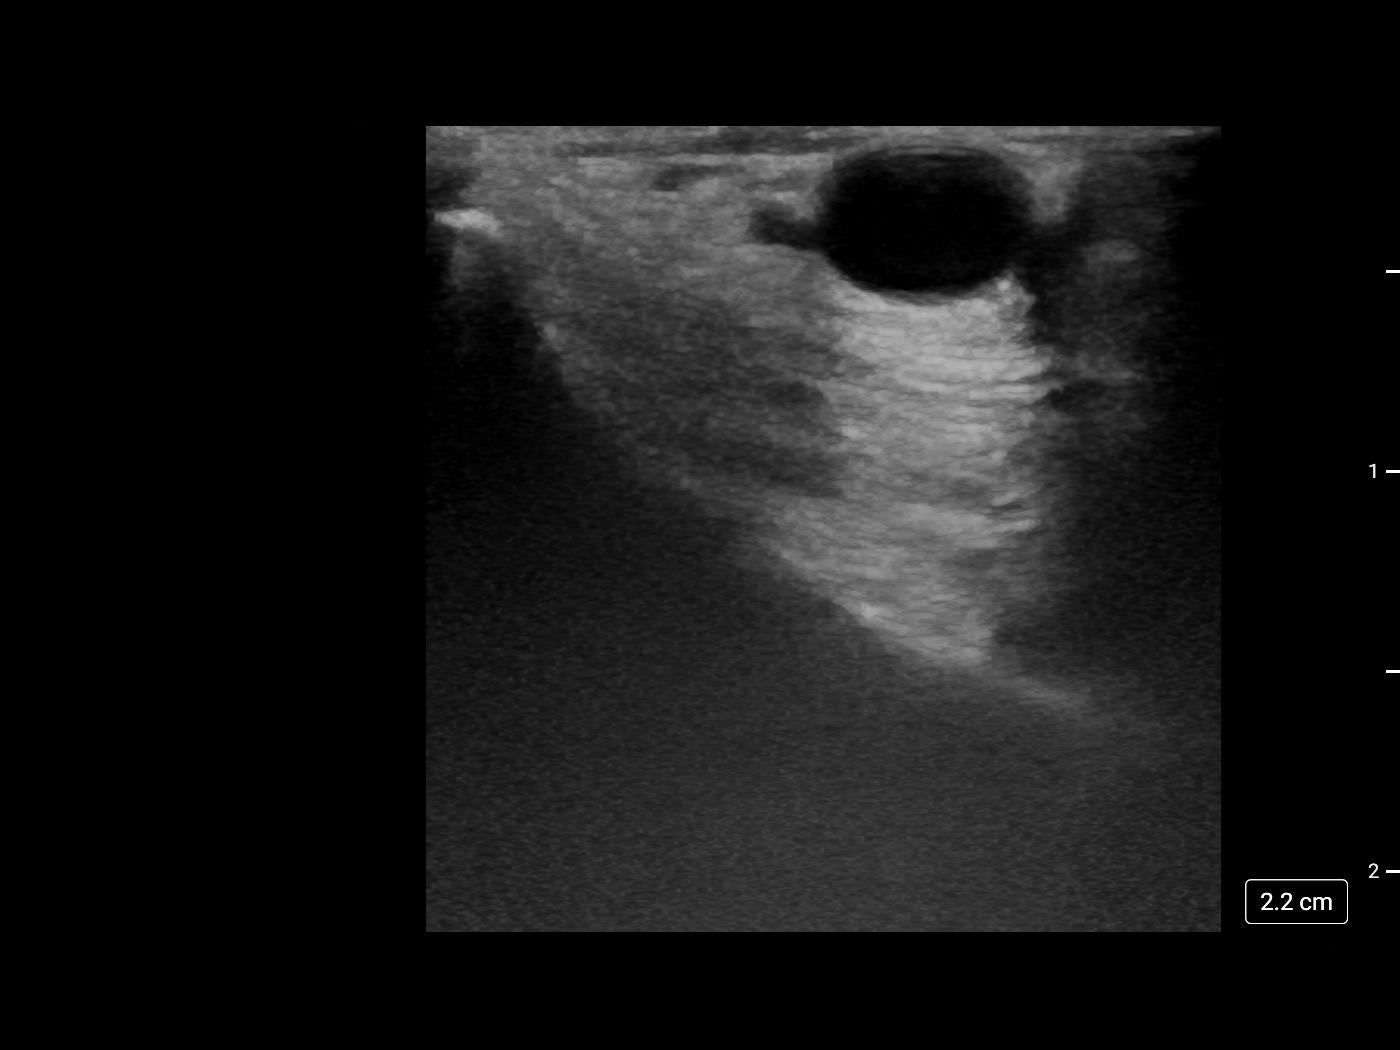

[2 of 2 positions shown; findings below may reference images not displayed]

MEDICATIONS:
5,000 units heparin, 5 mg Verapamil and 200 mcg nitroglycerin
intra-arterially. 3,000 units of heparin intravenously.

ANESTHESIA/SEDATION:
The procedure was performed under general anesthesia.

CONTRAST:  154 mL of Omnipaque 300 milligram/mL.

FLUOROSCOPY:
Radiation Exposure Index (as provided by the fluoroscopic device):
2177 mGy Kerma

COMPLICATIONS:
None immediate.
Maximal Sterile Barrier Technique was utilized including caps, mask,
sterile gowns, sterile gloves, sterile drape, hand hygiene and skin
antiseptic. A timeout was performed prior to the initiation of the
procedure.

Using the modified Seldinger technique and a micropuncture kit,
access was gained to the right radial artery at the anatomical
snuffbox and a 7 French sheath was placed. Real-time ultrasound
guidance was utilized for vascular access including the acquisition
of a permanent ultrasound image documenting patency of the accessed
vessel. Slow intra arterial infusion of 5,000 CHRISTPOHER heparin, 5 mg
Verapamil and 200 mcg nitroglycerin diluted in patient's own blood
was performed. No significant fluctuation in patient's blood
pressure seen. Then, a right radial artery angiogram was obtained
via sheath side port. Normal brachial artery branching pattern seen.
No significant anatomical variation. The right radial artery caliber
is adequate for vascular access.

Next, a 5 Tee Dragon 2 glide catheter was navigated over a
0.035" Terumo Glidewire into the right subclavian artery under
fluoroscopic guidance. Under fluoroscopy, the catheter was advanced
into the aortic arch where the catheter tip was reformed. The
catheter was then placed into the right common carotid artery.
Frontal and lateral angiograms of the neck were obtained. Using
biplane roadmap guidance, the catheter was advanced into the right
internal carotid artery. Frontal and lateral angiograms of the head
were obtained. 3D rotational angiograms were acquired and post
processed in a separate workstation under concurrent attending
physician supervision. Selected images were sent to PACS. Additional
magnified frontal and lateral views of the head were obtained,
centered on the right ACA.
FINDINGS: 1. Normal caliber of the right radial artery, adequate for vascular
access. Again seen is a radial arteriovenous fistula at the wrist.
2. Atherosclerotic changes of the right carotid bifurcation with
calcified plaque without hemodynamically significant stenosis.
Increased tortuosity of the distal cervical right ICA.
3. Irregularly-shaped saccular aneurysm of the proximal right A2/ACA
measuring 4.5 mm in maximum oblique diameter, 3.6 mm at the dome, 3
mm perennial caudal and 2.6 mm transverse. A pseudo lobulation is
noted at the dome of the aneurysm.
4. A 3 mm posterior communicating artery infundibulum is again seen.

PROCEDURE:
This Sim 2 glide catheter was exchanged over the wire and under
biplane roadmap guidance for an [place] guide catheter which was
placed in the petrous segment of the right ICA. Magnified frontal
and lateral angiograms of the head were obtained, centered on the
right ACA.

Using biplane roadmap guidance, an SL 10 microcatheter was navigated
over a synchro 2 micro guidewire into the right A2/ACA aneurysm
pouch. Then, a a 2.5 mm x 6 cm target Tetra foramen coil was placed
within the aneurysm pouch. Control magnified frontal and lateral
angiograms were obtained. Adequate coil position within the aneurysm
pouch was seen. The coil was then detached. A 1.5 mm x 3 cm target
Tetra finishing coil was placed within the aneurysm pouch. Magnified
frontal and lateral angiograms were obtained showing complete
aneurysm occlusion. The coil was detached and the microcatheter was
removed. Magnified frontal and lateral angiograms of the head showed
complete aneurysm occlusion without evidence of prolapse into the
parent artery.

Right internal carotid artery angiograms with frontal and lateral
views of the entire head showed no evidence of thromboembolic
complication.

Flat panel CT of the head was obtained and post processed in a
separate workstation with concurrent attending physician
supervision. Selected images were sent to PACS. No evidence of
hemorrhagic complication.

The catheter was subsequently withdrawn.

An inflatable band was placed and inflated over the right wrist
access site. The vascular sheath was withdrawn and the band was
slowly deflated until brisk flow was noted through the arteriotomy
site. At this point, the band was reinflated with additional 3 cc of
air to obtain patent hemostasis.
IMPRESSION: Successful and uncomplicated endovascular coil embolization of the
proximal right A2/ACA saccular aneurysm.

PLAN:
*Patient may discontinue brilinta in one week.
*Follow-up cerebral angiogram to evaluate treatment outcome in 6
months.

## 2023-02-19 ENCOUNTER — Ambulatory Visit: Payer: Medicare PPO | Admitting: Dermatology

## 2023-05-01 ENCOUNTER — Other Ambulatory Visit (HOSPITAL_COMMUNITY): Payer: Self-pay | Admitting: Interventional Radiology

## 2023-05-01 DIAGNOSIS — I671 Cerebral aneurysm, nonruptured: Secondary | ICD-10-CM

## 2023-05-08 ENCOUNTER — Ambulatory Visit (HOSPITAL_COMMUNITY)
Admission: RE | Admit: 2023-05-08 | Discharge: 2023-05-08 | Disposition: A | Discharge status: Home / Self Care | Source: Ambulatory Visit | Attending: Interventional Radiology | Admitting: Interventional Radiology

## 2023-05-08 DIAGNOSIS — I671 Cerebral aneurysm, nonruptured: Secondary | ICD-10-CM | POA: Diagnosis present

## 2023-05-21 ENCOUNTER — Encounter (INDEPENDENT_AMBULATORY_CARE_PROVIDER_SITE_OTHER): Payer: Self-pay

## 2023-06-03 ENCOUNTER — Telehealth (HOSPITAL_COMMUNITY): Payer: Self-pay

## 2023-06-03 NOTE — Telephone Encounter (Signed)
Pt agreed to f/u in 1 year with an MRA. AB

## 2023-06-29 ENCOUNTER — Encounter (HOSPITAL_COMMUNITY): Payer: Self-pay | Admitting: Interventional Radiology

## 2023-08-15 ENCOUNTER — Other Ambulatory Visit: Payer: Self-pay | Admitting: Rheumatology

## 2023-08-15 DIAGNOSIS — M25461 Effusion, right knee: Secondary | ICD-10-CM

## 2023-08-15 DIAGNOSIS — M25561 Pain in right knee: Secondary | ICD-10-CM

## 2023-08-15 DIAGNOSIS — M2391 Unspecified internal derangement of right knee: Secondary | ICD-10-CM

## 2023-08-16 ENCOUNTER — Encounter: Payer: Self-pay | Admitting: Rheumatology

## 2023-08-20 ENCOUNTER — Inpatient Hospital Stay
Admission: RE | Admit: 2023-08-20 | Discharge: 2023-08-20 | Discharge status: Home / Self Care | Source: Ambulatory Visit | Attending: Rheumatology | Admitting: Rheumatology

## 2023-08-20 ENCOUNTER — Other Ambulatory Visit

## 2023-08-20 DIAGNOSIS — M2391 Unspecified internal derangement of right knee: Secondary | ICD-10-CM

## 2023-08-20 DIAGNOSIS — M25461 Effusion, right knee: Secondary | ICD-10-CM

## 2023-08-20 DIAGNOSIS — M25561 Pain in right knee: Secondary | ICD-10-CM

## 2023-11-15 ENCOUNTER — Other Ambulatory Visit (HOSPITAL_COMMUNITY): Payer: Self-pay | Admitting: Radiology

## 2023-11-15 DIAGNOSIS — I6523 Occlusion and stenosis of bilateral carotid arteries: Secondary | ICD-10-CM

## 2023-11-18 ENCOUNTER — Encounter: Payer: Self-pay | Admitting: Neuroradiology

## 2023-11-18 ENCOUNTER — Ambulatory Visit: Admitting: Neuroradiology

## 2023-11-18 VITALS — BP 154/80 | HR 60 | Ht 73.0 in | Wt 182.0 lb

## 2023-11-18 DIAGNOSIS — I671 Cerebral aneurysm, nonruptured: Secondary | ICD-10-CM

## 2023-11-18 NOTE — Progress Notes (Signed)
 I had the pleasure of meeting Timothy Reeves in the office today.  I am assuming his care for Dr. Evern.  Briefly, he had a brain MRI 09/14/2020 which was obtained for evaluation of dizziness.  This demonstrated possible aneurysms, and he subsequently had angiographic studies which confirmed the abnormalities, and a diagnostic arteriogram on September 22, 2020.  I reviewed all of those studies.  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  On 04/17/2021 he had a digital subtraction arteriogram which showed near occlusion of both of the treated aneurysms, and a 3 mm right A2 aneurysm.  In retrospect, it was probably present on the prior studies.  The right ACA aneurysm was coiled 05/18/2021.  It was about 3 mm with a small neck.  I also reviewed that study.  He had a follow-up arteriogram on 10/17/2021, which I reviewed, and all of the aneurysms were occluded.  He has since had to follow-up MR angiograms, 10/31/2022 and 05/08/2023.  I reviewed both of those studies, and they both show complete occlusion of the aneurysms and no new aneurysms.  He is anxious about his situation because his mother died of a brain aneurysm, and he had a daughter who also had a brain aneurysm.  Assessment:  He has a history of 3 unruptured brain aneurysms, incidentally detected, all of which have been adequately treated with stable follow-up now for 2 years.  Ordinarily I would not recommend additional imaging follow-up, but he is quite anxious given his family history. I have reassured him that it is unlikely he will develop a new aneurysm, and even more unlikely that 1 would rupture.  However, given his continued anxiety about the situation, we are going to check an MR angiogram a year from now.  I spent more than 45 minutes with review of his numerous imaging studies, arteriograms and endovascular  surgeries, and consultation.

## 2023-12-09 ENCOUNTER — Other Ambulatory Visit (INDEPENDENT_AMBULATORY_CARE_PROVIDER_SITE_OTHER): Payer: Medicare PPO

## 2023-12-09 ENCOUNTER — Encounter (INDEPENDENT_AMBULATORY_CARE_PROVIDER_SITE_OTHER): Payer: Self-pay | Admitting: Vascular Surgery

## 2023-12-09 ENCOUNTER — Ambulatory Visit (INDEPENDENT_AMBULATORY_CARE_PROVIDER_SITE_OTHER): Payer: Medicare PPO | Admitting: Vascular Surgery

## 2023-12-09 ENCOUNTER — Other Ambulatory Visit (INDEPENDENT_AMBULATORY_CARE_PROVIDER_SITE_OTHER)

## 2023-12-09 VITALS — BP 147/78 | HR 61 | Resp 18 | Wt 186.0 lb

## 2023-12-09 DIAGNOSIS — I721 Aneurysm of artery of upper extremity: Secondary | ICD-10-CM

## 2023-12-09 DIAGNOSIS — I6523 Occlusion and stenosis of bilateral carotid arteries: Secondary | ICD-10-CM

## 2023-12-09 DIAGNOSIS — J449 Chronic obstructive pulmonary disease, unspecified: Secondary | ICD-10-CM | POA: Diagnosis not present

## 2023-12-09 DIAGNOSIS — I10 Essential (primary) hypertension: Secondary | ICD-10-CM

## 2023-12-09 DIAGNOSIS — E782 Mixed hyperlipidemia: Secondary | ICD-10-CM

## 2023-12-09 NOTE — Progress Notes (Signed)
 MRN : [id number]  Timothy Reeves. is a 68 y.o. (16-Jun-1955) male who presents with chief complaint of check circulation.  History of Present Illness:   The patient is seen for follow up evaluation of repair of right radial artery aneurysm as well as carotid stenosis. The radial artery and carotid stenosis is followed by ultrasound.  PROCEDURE 09/14/2022: Resection of right radial artery pseudoaneurysm with repair of right radial artery   Patient returns today for his routine follow-up.  He has no new complaints.  He has resumed his normal activities and is working in his shop.  He has no pain in his hand or his wrist.    The patient denies amaurosis fugax. There is no recent history of TIA symptoms or focal motor deficits. There is no prior documented CVA.  The patient is taking enteric-coated aspirin  81 mg daily.  No recent shortening of the patient's walking distance or new symptoms consistent with claudication.  No history of rest pain symptoms. No new ulcers or wounds of the lower extremities have occurred.  There is no history of DVT, PE or superficial thrombophlebitis. No documented recent episodes of angina or shortness of breath documented.   Carotid Duplex done today shows 1-39% bilaterally.  No change compared to last study.   Duplex ultrasound of the right upper extremity obtained today demonstrates a normal arterial system.  There is no evidence for AV fistula or aneurysm formation.  Repair appears intact.    No outpatient medications have been marked as taking for the 12/09/23 encounter (Appointment) with Jama, Cordella MATSU, MD.    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)    a.) 4.3 x 2.4 mm LEFT ICA/communicating segment aneurysm --> s/p placement of 2 overlapping pipeline flow diverters on 10/03/2020   Aneurysm of middle  cerebral artery    a.) 6.5 x 5.7 mm LEFT MCA bifurcation aneurysm --> 7 mm web device placed 10/03/2020   Anterior communicating artery aneurysm    a.) 3.6 mm RIGHT A2/ACA aneurysm s/p coil embolization 05/08/2021 --> 2.5 mm x 6 cm target Tetra foramen coil (aneurysm pouch) and 1.5 mm x 3 cm target Tetra finishing coil placed   Anxiety    Arthritis    Bilateral carotid artery disease 08/27/2022   a.) carotid doppler 08/27/2022: 1-39% BICA   COPD (chronic obstructive pulmonary disease) (HCC)    Coronary artery disease 02/03/2008   a.) LHC 02/03/2008: 50% pLAD, 50% D1 - med mgmt; b.) LHC 11/30/2010: 40% pLAD - med mgmt   Discoid lupus    ED (erectile dysfunction)    GERD (gastroesophageal reflux disease)    Headache    History of 2019 novel coronavirus disease (COVID-19) 07/20/2020   HLD (hyperlipidemia)    Hypertension    Iatrogenic arteriovenous fistula    Left bundle branch block (LBBB)    Long term current use of immunosuppressive drug    a.) interleukin-17A blocker (ixekizumab) for psoriasis/psoriatic arthritis Dx   Long-term use of aspirin  therapy    NICM (  nonischemic cardiomyopathy) (HCC)    a.) MV 11/19/2019: EF 35%; b.) TTE 06/28/2020: EF 35%, mild LVH, mod global HK, mild MR/TR, RVSP 32.8; c.) TTE 06/18/2022: EF 40%, mild global HK, mild BAE, mild MR/TR, triv PR, RVSP 43.1   Pneumonia    Pre-diabetes    Psoriasis    Psoriatic arthritis (HCC)    a.) currently on ixekizumab (Taltz); b.) formerlly Tx'd with MTX (lost efficacy), adalimumab (lost efficacy), entanercept (effective; insurance changed and would not cover)    Past Surgical History:  Procedure Laterality Date   CARDIAC CATHETERIZATION     IR 3D INDEPENDENT WKST  09/22/2020   IR 3D INDEPENDENT WKST  10/04/2020   IR 3D INDEPENDENT WKST  05/08/2021   IR ANGIO INTRA EXTRACRAN SEL COM CAROTID INNOMINATE UNI L MOD SED  04/17/2021   IR ANGIO INTRA EXTRACRAN SEL INTERNAL CAROTID BILAT MOD SED  09/22/2020   IR ANGIO INTRA  EXTRACRAN SEL INTERNAL CAROTID BILAT MOD SED  10/17/2021   IR ANGIO INTRA EXTRACRAN SEL INTERNAL CAROTID UNI L MOD SED  10/03/2020   IR ANGIO INTRA EXTRACRAN SEL INTERNAL CAROTID UNI R MOD SED  04/17/2021   IR ANGIO INTRA EXTRACRAN SEL INTERNAL CAROTID UNI R MOD SED  05/11/2021   IR ANGIO VERTEBRAL SEL SUBCLAVIAN INNOMINATE UNI L MOD SED  09/22/2020   IR ANGIO VERTEBRAL SEL SUBCLAVIAN INNOMINATE UNI L MOD SED  10/17/2021   IR ANGIO VERTEBRAL SEL VERTEBRAL UNI L MOD SED  04/17/2021   IR ANGIO VERTEBRAL SEL VERTEBRAL UNI R MOD SED  09/22/2020   IR ANGIO VERTEBRAL SEL VERTEBRAL UNI R MOD SED  10/17/2021   IR ANGIOGRAM FOLLOW UP STUDY  10/04/2020   IR ANGIOGRAM FOLLOW UP STUDY  10/03/2020   IR ANGIOGRAM FOLLOW UP STUDY  05/08/2021   IR ANGIOGRAM FOLLOW UP STUDY  05/08/2021   IR CT HEAD LTD  10/03/2020   IR CT HEAD LTD  05/08/2021   IR NEURO EACH ADD'L AFTER BASIC UNI LEFT (MS)  10/05/2020   IR NEURO EACH ADD'L AFTER BASIC UNI RIGHT (MS)  05/11/2021   IR RADIOLOGIST EVAL & MGMT  05/05/2021   IR TRANSCATH/EMBOLIZ  10/03/2020   IR TRANSCATH/EMBOLIZ  05/08/2021   IR US  GUIDE VASC ACCESS RIGHT  09/22/2020   IR US  GUIDE VASC ACCESS RIGHT  10/03/2020   IR US  GUIDE VASC ACCESS RIGHT  04/17/2021   IR US  GUIDE VASC ACCESS RIGHT  05/08/2021   IR US  GUIDE VASC ACCESS RIGHT  10/17/2021   RADIOLOGY WITH ANESTHESIA N/A 10/03/2020   Procedure: EMBOLIZATION WITH ANESTHESIA;  Surgeon: de Macedo Rodrigues, [name], MD;  Location: [facility] OR;  Service: Radiology;  Laterality: N/A;   RADIOLOGY WITH ANESTHESIA N/A 05/08/2021   Procedure: EMBOLIZATION;  Surgeon: de Macedo Rodrigues, [name], MD;  Location[facility] OR;  Service: Radiology;  Laterality: N/A;   REVISON OF ARTERIOVENOUS FISTULA Right 09/14/2022   Procedure: REVISON OF ARTERIOVENOUS FISTULA (RADIAL ARTERY REPAIR - [id number]);  Surgeon: Jama Cordella MATSU, MD;  Location: [facility] ORS;  Service: Vascular;  Laterality: Right;    Social History Social History   Tobacco Use   Smoking status:  Former    Current packs/day: 0.00    Average packs/day: 1 pack/day for 37.0 years (37.0 ttl pk-yrs)    Types: Cigarettes    Start date: 10/05/1971    Quit date: 10/04/2008    Years since quitting: 15.1   Smokeless tobacco: Never  Vaping Use   Vaping status: Never Used  Substance  Use Topics   Alcohol use: Yes    Alcohol/week: 2.0 standard drinks of alcohol    Types: 2 Cans of beer per week   Drug use: Not Currently    Family History No family history on file.  Allergies  Allergen Reactions   Lipitor [Atorvastatin] Other (See Comments)    Muscle aches     REVIEW OF SYSTEMS (Negative unless checked)  Constitutional: [] Weight loss  [] Fever  [] Chills Cardiac: [] Chest pain   [] Chest pressure   [] Palpitations   [] Shortness of breath when laying flat   [] Shortness of breath with exertion. Vascular:  [x] Pain in legs with walking   [] Pain in legs at rest  [] History of DVT   [] Phlebitis   [] Swelling in legs   [] Varicose veins   [] Non-healing ulcers Pulmonary:   [] Uses home oxygen   [] Productive cough   [] Hemoptysis   [] Wheeze  [x] COPD   [] Asthma Neurologic:  [] Dizziness   [] Seizures   [] History of stroke   [] History of TIA  [] Aphasia   [] Vissual changes   [] Weakness or numbness in arm   [] Weakness or numbness in leg Musculoskeletal:   [] Joint swelling   [x] Joint pain   [] Low back pain Hematologic:  [] Easy bruising  [] Easy bleeding   [] Hypercoagulable state   [] Anemic Gastrointestinal:  [] Diarrhea   [] Vomiting  [x] Gastroesophageal reflux/heartburn   [] Difficulty swallowing. Genitourinary:  [] Chronic kidney disease   [] Difficult urination  [] Frequent urination   [] Blood in urine Skin:  [] Rashes   [] Ulcers  Psychological:  [] History of anxiety   []  History of major depression.  Physical Examination  There were no vitals filed for this visit. There is no height or weight on file to calculate BMI. Gen: WD/WN, NAD Head: [place]/AT, No temporalis wasting.  Ear/Nose/Throat: Hearing grossly  intact, nares w/o erythema or drainage Eyes: PER, EOMI, sclera nonicteric.  Neck: Supple, no masses.  No bruit or JVD.  Pulmonary:  Good air movement, no audible wheezing, no use of accessory muscles.  Cardiac: RRR, normal S1, S2, no Murmurs. Vascular: Radial artery repair is intact no recurrence, no open wounds Vessel Right Left  Radial Palpable Palpable  Carotid Palpable Palpable  Gastrointestinal: soft, non-distended. No guarding/no peritoneal signs.  Musculoskeletal: M/S 5/5 throughout.  No visible deformity.  Neurologic: CN 2-12 intact. Pain and light touch intact in extremities.  Symmetrical.  Speech is fluent. Motor exam as listed above. Psychiatric: Judgment intact, Mood & affect appropriate for pt's clinical situation. Dermatologic: No rashes or ulcers noted.  No changes consistent with cellulitis.   CBC Lab Results  Component Value Date   WBC 4.9 09/11/2022   HGB 13.4 09/11/2022   HCT 39.8 09/11/2022   MCV 89.8 09/11/2022   PLT 236 09/11/2022    BMET    Component Value Date/Time   NA 138 09/11/2022 1527   K 4.3 09/11/2022 1527   CL 111 09/11/2022 1527   CO2 21 (L) 09/11/2022 1527   GLUCOSE 105 (H) 09/11/2022 1527   BUN 21 09/11/2022 1527   CREATININE 1.13 09/11/2022 1527   CALCIUM  8.9 09/11/2022 1527   GFRNONAA >60 09/11/2022 1527   CrCl cannot be calculated (Patient's most recent lab result is older than the maximum 21 days allowed.).  COAG Lab Results  Component Value Date   INR 1.0 10/17/2021   INR 1.0 05/08/2021   INR 1.0 04/14/2021    Radiology No results found.   Assessment/Plan 1. Bilateral carotid artery stenosis (Primary) Recommend:   Given the patient's asymptomatic subcritical  stenosis no further invasive testing or surgery at this time.   Duplex ultrasound shows 1-39% stenosis bilaterally.   Continue antiplatelet therapy as prescribed Continue management of CAD, HTN and Hyperlipidemia Healthy heart diet,  encouraged exercise at  least 4 times per week   Follow up in 24 months with duplex ultrasound and physical exam  - VAS US  CAROTID; Future  2. Radial artery aneurysm, right His repair appears intact and successful.  There are no further issues here and we will continue to monitor this with surveillance.  No further intervention is indicated.  Patient will follow-up in 2 years with a duplex ultrasound - VAS US  UPPER EXTREMITY ARTERIAL DUPLEX; Future  3. Essential hypertension Continue antihypertensive medications as already ordered, these medications have been reviewed and there are no changes at this time.  4. COPD, mild (HCC) Continue pulmonary medications and aerosols as already ordered, these medications have been reviewed and there are no changes at this time.   5. Mixed hyperlipidemia Continue statin as ordered and reviewed, no changes at this time    Cordella Shawl, MD  12/09/2023 8:35 AM
